# Patient Record
Sex: Male | Born: 1945
Health system: Southern US, Community
[De-identification: ages and names within clinical notes are randomized; demographics above are authoritative.]

## PROBLEM LIST (undated history)

## (undated) DIAGNOSIS — R569 Unspecified convulsions: Secondary | ICD-10-CM

## (undated) DIAGNOSIS — R351 Nocturia: Secondary | ICD-10-CM

## (undated) DIAGNOSIS — E119 Type 2 diabetes mellitus without complications: Secondary | ICD-10-CM

## (undated) DIAGNOSIS — IMO0002 Reserved for concepts with insufficient information to code with codable children: Secondary | ICD-10-CM

## (undated) DIAGNOSIS — I10 Essential (primary) hypertension: Secondary | ICD-10-CM

## (undated) DIAGNOSIS — R011 Cardiac murmur, unspecified: Secondary | ICD-10-CM

## (undated) DIAGNOSIS — R413 Other amnesia: Secondary | ICD-10-CM

## (undated) DIAGNOSIS — M329 Systemic lupus erythematosus, unspecified: Secondary | ICD-10-CM

## (undated) DIAGNOSIS — I639 Cerebral infarction, unspecified: Secondary | ICD-10-CM

## (undated) HISTORY — DX: Cardiac murmur, unspecified: R01.1

## (undated) HISTORY — DX: Type 2 diabetes mellitus without complications: E11.9

## (undated) HISTORY — DX: Reserved for concepts with insufficient information to code with codable children: IMO0002

## (undated) HISTORY — PX: EYE SURGERY: SHX253

## (undated) HISTORY — DX: Systemic lupus erythematosus, unspecified: M32.9

## (undated) HISTORY — DX: Other amnesia: R41.3

## (undated) HISTORY — DX: Nocturia: R35.1

## (undated) HISTORY — DX: Unspecified convulsions: R56.9

## (undated) HISTORY — DX: Cerebral infarction, unspecified: I63.9

## (undated) HISTORY — PX: APPENDECTOMY: SHX54

## (undated) HISTORY — DX: Essential (primary) hypertension: I10

---

## 2003-03-22 ENCOUNTER — Ambulatory Visit (HOSPITAL_COMMUNITY): Admission: RE | Admit: 2003-03-22 | Discharge: 2003-03-22 | Payer: Self-pay | Admitting: *Deleted

## 2003-03-22 ENCOUNTER — Encounter (INDEPENDENT_AMBULATORY_CARE_PROVIDER_SITE_OTHER): Payer: Self-pay | Admitting: *Deleted

## 2006-08-04 ENCOUNTER — Ambulatory Visit (HOSPITAL_COMMUNITY): Admission: RE | Admit: 2006-08-04 | Discharge: 2006-08-04 | Payer: Self-pay | Admitting: *Deleted

## 2007-06-25 ENCOUNTER — Emergency Department (HOSPITAL_COMMUNITY): Admission: EM | Admit: 2007-06-25 | Discharge: 2007-06-25 | Payer: Self-pay | Admitting: Family Medicine

## 2011-02-20 NOTE — Op Note (Signed)
   NAME:  JAHN, FRANCHINI NO.:  [id number]   MEDICAL RECORD NO.:  [id number]                   PATIENT TYPE:  AMB   LOCATION:  ENDO                                 FACILITY:  [facility]   PHYSICIAN:  Georgiana Spinner, M.D.                 DATE OF BIRTH:  [birth year]   DATE OF PROCEDURE:  DATE OF DISCHARGE:                                 OPERATIVE REPORT   PROCEDURE:  Colonoscopy.   ENDOSCOPIST:  Georgiana Spinner, M.D.   INDICATIONS:  Colon polyps.   ANESTHESIA:  Demerol 25 mg and Versed 2.5 mg.   DESCRIPTION OF PROCEDURE:  With the patient mildly sedated in the left  lateral decubitus position, the Olympus videoscopic coloscope was inserted  into the rectum and passed under direct vision into the cecum, identified by  ileocecal valve and appendiceal orifice both of which were photographed.   From this point, the colonoscope was slowly withdrawn taking circumferential  views of the entire colonic mucosa, stopping first in the ascending colon  where a small polyp was seen, photographed, and removed using hot biopsy  forceps technique setting of 2200 on the RBR nonphoto coagulator.  We next  stopped at the splenic flexure where another polyp was removed in the same  manner and we pulled it back all the way to the rectum which appeared  normal.  The rectum showed a small polyp on retroflex view that was removed,  again, using the hot biopsy forceps technique with the same setting on the  Argon photocoagulator by RBR.  The endoscope was straightened and withdrawn.  The patient's vital signs and pulse oximetry remained stable.  The patient  tolerated the procedure well without apparent complications.   FINDINGS:  Three small polyps; 1 in ascending, 2 at splenic flexure, 3 in  rectum -- all removed.   PLAN:  Await biopsy report.  Patient will call me for results and follow up  with me as an outpatient.                                               Georgiana Spinner, M.D.    GMO/MEDQ  D:  03/22/2003  T:  03/22/2003  Job:  [id number]

## 2011-02-20 NOTE — Op Note (Signed)
   NAME:  David Irwin, David Irwin NO.:  [id number]   MEDICAL RECORD NO.:  [id number]                   PATIENT TYPE:  AMB   LOCATION:  ENDO                                 FACILITY:  [facility]   PHYSICIAN:  Georgiana Spinner, M.D.                 DATE OF BIRTH:  Jul 25, 1946   DATE OF PROCEDURE:  DATE OF DISCHARGE:                                 OPERATIVE REPORT   PROCEDURE:  Upper endoscopy.   ENDOSCOPIST:  Georgiana Spinner, M.D.   INDICATIONS:  GERD.   ANESTHESIA:  Demerol 75, Versed 7.5.   DESCRIPTION OF PROCEDURE:  With the patient mildly sedated in the left  lateral decubitus position, the Olympus videoscopic endoscope was inserted  into the mouth and passed under direct vision through the esophagus which  appeared normal, into the stomach, fundus, body, antrum, duodenal bulb, and  second portion of the duodenum all appeared normal.   From this point the endoscope was slowly withdrawn taking circumferential  views of the duodenal mucosa until the endoscope then pulled back into the  stomach and placed in retroflexion to view the stomach from below.  An  incomplete wrap at the GE junction around the endoscope was noted and  photographed.  The endoscope was straightened and withdrawn taking  circumferential views of the remaining gastric and esophageal mucosa.  The  patient's vital signs and pulse oximetry remained stable.  The patient  tolerated the procedure well without apparent complications.   FINDINGS:  Incomplete wrap with gastroesophageal junction around the  endoscope indicating some degree of reflux, possibility, otherwise  unremarkable exam.   PLAN:  Proceed to colonoscopy.                                               Georgiana Spinner, M.D.    GMO/MEDQ  D:  03/22/2003  T:  03/22/2003  Job:  [id number]

## 2011-02-20 NOTE — Op Note (Signed)
NAMEJAYEL, INKS NO.:  [id number]   MEDICAL RECORD NO.:  [id number]          PATIENT TYPE:  AMB   LOCATION:  ENDO                         FACILITY:  [facility]   PHYSICIAN:  Georgiana Spinner, M.D.    DATE OF BIRTH:  11/18/1945   DATE OF PROCEDURE:  08/03/2006  DATE OF DISCHARGE:                                 OPERATIVE REPORT   PROCEDURE:  Colonoscopy.   INDICATIONS:  Colon polyps.   ANESTHESIA:  Demerol 100 mg, Versed 10 mg.   PROCEDURE:  With the patient mildly sedated in the left lateral decubitus  position, the Olympus videoscopic colonoscope was inserted into the rectum  and passed under direct vision to the cecum, identified by the crow's foot  of the cecum and ileocecal valve, both of which were photographed.  From  this point the colonoscope was slowly withdrawn taking circumferential views  of the colonic mucosa, stopping only in the rectum, which appeared normal on  direct and retroflexed view.  The endoscope was straightened and withdrawn.  The patient's vital signs and pulse oximeter remained stable.  The patient  tolerated procedure well without apparent complication.   FINDINGS:  Unremarkable examination.   PLAN:  Consider repeat examination in 5 years.           ______________________________  Georgiana Spinner, M.D.     GMO/MEDQ  D:  08/04/2006  T:  08/04/2006  Job:  [id number]

## 2013-05-01 ENCOUNTER — Encounter: Payer: Self-pay | Admitting: Nurse Practitioner

## 2013-05-01 DIAGNOSIS — R413 Other amnesia: Secondary | ICD-10-CM

## 2013-05-01 DIAGNOSIS — G3184 Mild cognitive impairment, so stated: Secondary | ICD-10-CM | POA: Insufficient documentation

## 2013-05-02 ENCOUNTER — Ambulatory Visit: Payer: Self-pay | Admitting: Diagnostic Neuroimaging

## 2013-05-02 ENCOUNTER — Ambulatory Visit (INDEPENDENT_AMBULATORY_CARE_PROVIDER_SITE_OTHER): Payer: Medicare Other | Admitting: Nurse Practitioner

## 2013-05-02 ENCOUNTER — Encounter: Payer: Self-pay | Admitting: Nurse Practitioner

## 2013-05-02 VITALS — BP 142/81 | HR 83 | Temp 97.9°F | Ht 67.0 in | Wt 232.0 lb

## 2013-05-02 DIAGNOSIS — R413 Other amnesia: Secondary | ICD-10-CM

## 2013-05-02 NOTE — Progress Notes (Signed)
[facility]  PATIENT: David Irwin DOB: Sep 16, 1946   HISTORY FROM: patient, wife, chart REASON FOR VISIT: 6 month memory follow up, results   HISTORICAL  CHIEF COMPLAINT:  Chief Complaint  Patient presents with  . Follow-up    HISTORY OF PRESENT ILLNESS: UPDATE 05/02/13 (LL): Patient and wife come to office for 6 month follow up for memory testing.  Both feel that memory has been stable, no more instances of getting lost while driving.  Patient feels like he has not gotten any worse, he has trouble remembering things that he knows sometimes, but he states it usually comes to him with time.  Just not on "his" time.  10/28/12:  PRIOR HPI (VRP): 67 year old right-handed male with history of lupus, diabetes, hypercholesterolemia, and hypertension, here for evaluation of memory loss.  Summary 2013, patient got lost while driving twice [place] [address]. He was briefly turned around but eventually was able to make his way to his destination. He had a third episode in December 2013. Since that time he has had no further events. His wife has noticed some short-term memory problems especially with verbal and auditory memory. One time he forgot his granddaughter's name (she is 31 years old and lives [place]). He has 3 grandchildren total. Patient denies any significant sleep or mood problems. Patient's wife reports some mild mood swings.  REVIEW OF SYSTEMS: Full 14 system review of systems performed and notable only for:  Constitutional: N/A  Cardiovascular: N/A  Ear/Nose/Throat: N/A  Skin: N/A  Eyes: N/A  Respiratory: N/A  Gastroitestinal: N/A  Hematology/Lymphatic: N/A  Endocrine: N/A Musculoskeletal:N/A  Allergy/Immunology: N/A  Neurological: N/A Psychiatric: N/A   ALLERGIES: No Known Allergies  HOME MEDICATIONS: Outpatient Prescriptions Prior to Visit  Medication Sig Dispense Refill  . aspirin 81 MG tablet Take 81 mg by mouth daily.      .  CYANOCOBALAMIN PO Take by mouth daily.      . fish oil-omega-3 fatty acids 1000 MG capsule 3 g daily.      . hydroxychloroquine (PLAQUENIL) 200 MG tablet Take by mouth daily.      . Liraglutide (VICTOZA) 18 MG/3ML SOPN Inject into the skin. Use as directed      . Multiple Vitamins-Minerals (ICAPS MV PO) Take by mouth.      . simvastatin (ZOCOR) 20 MG tablet Take 20 mg by mouth every evening.      . tamsulosin (FLOMAX) 0.4 MG CAPS    Sig: Take 1 capsule by mouth daily.  Marland Kitchen KLOR-CON M20 20 MEQ tablet    Sig: Take 1 tablet by mouth daily.  . valsartan-hydrochlorothiazide (DIOVAN-HCT) 160-25 MG per tablet    Sig: Take 1 tablet by mouth 2 (two) times daily.  . metFORMIN (GLUCOPHAGE-XR) 500 MG 24 hr tablet    Sig: Take 1 tablet by mouth daily.    PAST MEDICAL HISTORY: Past Medical History  Diagnosis Date  . Lupus   . Diabetes mellitus without complication     PAST SURGICAL HISTORY: Past Surgical History  Procedure Laterality Date  . Eye surgery      FAMILY HISTORY: History reviewed. No pertinent family history.  SOCIAL HISTORY: History   Social History  . Marital Status: Married    Spouse Name: [name]    Number of Children: 2  . Years of Education: 12th   Occupational History  . Retired    Social History Main Topics  . Smoking status: Never Smoker   . Smokeless tobacco: Never Used  .  Alcohol Use: No  . Drug Use: No  . Sexually Active: Not on file   Other Topics Concern  . Not on file   Social History Narrative   Patient lives at home with spouse.   Caffeine Use: 1-2 cups daily     PHYSICAL EXAM  Filed Vitals:   05/02/13 0924  BP: 142/81  Pulse: 83  Temp: 97.9 F (36.6 C)  TempSrc: Oral  Height: 5\' 7"  (1.702 m)  Weight: 232 lb (105.235 kg)   Body mass index is 36.33 kg/(m^2).  Generalized: In no acute distress, well-developed well groomed AA male.  Neck: Supple, no carotid bruits   Cardiac: Regular rate rhythm, soft systolic murmur   Pulmonary:  Clear to auscultation bilaterally   Musculoskeletal: No deformity   Neurological examination   Mentation: Alert oriented to time, place, history taking, language fluent, and casual conversation. NO FRONTAL RELEASE SIGNS.  GDS 0. SLOW, CALM DEMEANOR.  Cranial nerve II-XII: Pupils were equal round reactive to light extraocular movements were full, visual field were full on confrontational test. facial sensation and strength were normal. hearing was intact to finger rubbing bilaterally. Uvula tongue midline. head turning and shoulder shrug and were normal and symmetric.Tongue protrusion into cheek strength was normal. MOTOR: normal bulk and tone, full strength in the BUE, BLE, fine finger movements normal, no pronator drift SENSORY: normal and symmetric to light touch, pinprick, temperature, vibration COORDINATION: finger-nose-finger, heel-to-shin bilaterally, there was no truncal ataxia REFLEXES: Deep tendon reflexes are TRACE and symmetric. GAIT/STATION: Rising up from seated position without assistance, normal stance, without trunk ataxia, moderate stride, good arm swing, smooth turning, able to perform tiptoe, and heel walking without difficulty. Romberg Negative.  DIAGNOSTIC DATA (LABS, IMAGING, TESTING) - I reviewed patient records, labs, notes, testing and imaging myself where available.  B12 791, TSH 2.14   MRI BRAIN W/Wo 02/04014: Mild periventricular and subcortical foci of T2 hyperintensities.  These findings are non-specific and considerations include autoimmune, inflammatory, post-infectious, microvascular ischemia or migraine associated etiologies.  No abnormal enhancing lesions.   ASSESSMENT AND PLAN  67 y.o. year old male  has a past medical history of Lupus and Diabetes mellitus without complication. here with mild cognitive impairment, memory loss, stable in the last 6 months.  Discussed donepizil with patient, he does not want to take another medication at this  time. Return to office for follow up in 1 year, sooner as needed.  Recommended daily exercise, try to relax more, take a trip with his wife, learn a new hobby or skill, these things may improve memory function.Marland Kitchen  LYNN LAM NP-C 05/02/2013, 10:18 AM  [facility] [address], [address] [place], [place] [address] [phone]

## 2013-05-02 NOTE — Patient Instructions (Addendum)
Return to office for follow up in 1 year, sooner as needed.  Try to relax, take a trip together, learn a new hobby, these things may improve memory.

## 2013-05-03 NOTE — Progress Notes (Signed)
I reviewed note and agree with plan.   VIKRAM R. PENUMALLI, MD 05/03/2013, 6:31 PM Certified in Neurology, Neurophysiology and Neuroimaging  [facility] [address], [address] , [place] [address] [phone]  

## 2014-05-02 ENCOUNTER — Ambulatory Visit: Payer: Medicare Other | Admitting: Nurse Practitioner

## 2014-05-04 ENCOUNTER — Encounter: Payer: Self-pay | Admitting: Nurse Practitioner

## 2014-05-04 ENCOUNTER — Ambulatory Visit (INDEPENDENT_AMBULATORY_CARE_PROVIDER_SITE_OTHER): Payer: Medicare HMO | Admitting: Nurse Practitioner

## 2014-05-04 VITALS — BP 113/71 | HR 79 | Ht 67.0 in | Wt 236.0 lb

## 2014-05-04 DIAGNOSIS — R413 Other amnesia: Secondary | ICD-10-CM

## 2014-05-04 DIAGNOSIS — E119 Type 2 diabetes mellitus without complications: Secondary | ICD-10-CM

## 2014-05-04 DIAGNOSIS — G3184 Mild cognitive impairment, so stated: Secondary | ICD-10-CM

## 2014-05-04 DIAGNOSIS — Z9989 Dependence on other enabling machines and devices: Secondary | ICD-10-CM

## 2014-05-04 DIAGNOSIS — G4733 Obstructive sleep apnea (adult) (pediatric): Secondary | ICD-10-CM

## 2014-05-04 NOTE — Patient Instructions (Addendum)
I think overall you are doing fairly well but I do want to suggest a few things today:  Remember to drink plenty of fluid, eat healthy meals and do not skip any meals. Try to eat protein with a every meal and eat a healthy snack such as fruit or nuts in between meals. Try to keep a regular sleep-wake schedule and try to exercise daily, particularly in the form of walking, 20-30 minutes a day, if you can. Good nutrition, proper sleep and exercise can help her cognitive function.  Engage in social activities in your community and with your family and try to keep up with current events by reading the newspaper or watching the news. If you have computer and can go online, try [email or web address]. Also, you may like to do word finding puzzles or crossword puzzles.  As far as your medications are concerned, I would like to suggest Taking Fish Oil capsules daily, Using your CPAP regularly, and get regular exercise.  Keep your blood sugars under control.    As far as diagnostic testing: nothing needed at this time.  I would like to see you back in 6 months, sooner if we need to. Please call us with any interim questions, concerns, problems, updates or refill requests.  Please also call us for any test results so we can go over those with you on the phone. My clinical assistant and will answer any of your questions and relay your messages to me and also relay most of my messages to you.  Our phone number is [phone]. We also have an after hours call service for urgent matters and there is a physician on-call for urgent questions. For any emergencies you know to call 911 or go to the nearest emergency room.

## 2014-05-04 NOTE — Progress Notes (Signed)
PATIENT: David BoozeGeorge Cowdery DOB: 11/16/1945  REASON FOR VISIT: routine follow up for memory HISTORY FROM: patient, wife  HISTORY OF PRESENT ILLNESS: UPDATE 05/04/14 (LL): Since last visit, patient has been well, but recognizes he has more difficulty remembering places he had been in the last year or two. He does not remember coming to this office one year ago. He has had no decline in activities of daily living. His blood sugars are well controlled, hemoglobin A1c is 6.0. He does not get regular exercise.  He has sleep apnea and has a CPAP machine and tries to use it but states that the full mask is uncomfortable. MMSE 24/30, AFT 12 and CDT 4/4.  Functional Assessment:  Activities of Daily Living (ADLs):   He is independent in the following: ambulation, bathing and hygiene, feeding, continence, grooming, toileting and dressing Requires assistance with the following: none Instrumental Activities of Daily Living (IADLs):   Patient is independent in the following: Ability to use telephone, shopping, preparation, housekeeping, laundry, driving, handling medications, handling finances.  Requires assistance with the following: none  UPDATE 05/02/13 (LL): Patient and wife come to office for 6 month follow up for memory testing. Both feel that memory has been stable, no more instances of getting lost while driving. Patient feels like he has not gotten any worse, he has trouble remembering things that he knows sometimes, but he states it usually comes to him with time. Just not on "his" time.   10/28/12: PRIOR HPI (VRP): 68 year old right-handed male with history of lupus, diabetes, hypercholesterolemia, and hypertension, here for evaluation of memory loss.   Summary 2013, patient got lost while driving twice [place] [address]. He was briefly turned around but eventually was able to make his way to his destination. He had a third episode in December 2013. Since that time he has had no further events.  His wife has noticed some short-term memory problems especially with verbal and auditory memory. One time he forgot his granddaughter's name (she is 66 years old and lives [place]). He has 3 grandchildren total. Patient denies any significant sleep or mood problems. Patient's wife reports some mild mood swings.   REVIEW OF SYSTEMS: Full 14 system review of systems performed and notable only for: snoring, moles, itching, memory loss, confusion, tremors  ALLERGIES: No Known Allergies  HOME MEDICATIONS: Outpatient Prescriptions Prior to Visit  Medication Sig Dispense Refill  . aspirin 81 MG tablet Take 81 mg by mouth daily.      . CYANOCOBALAMIN PO Take by mouth daily.      . fish oil-omega-3 fatty acids 1000 MG capsule 3 g daily.      . hydroxychloroquine (PLAQUENIL) 200 MG tablet Take by mouth daily.      Marland Kitchen. KLOR-CON M20 20 MEQ tablet Take 1 tablet by mouth daily.      . Liraglutide (VICTOZA) 18 MG/3ML SOPN Inject into the skin. Use as directed      . metFORMIN (GLUCOPHAGE-XR) 500 MG 24 hr tablet Take 1 tablet by mouth daily.      . Multiple Vitamins-Minerals (ICAPS MV PO) Take by mouth.      . tamsulosin (FLOMAX) 0.4 MG CAPS Take 1 capsule by mouth daily.      . valsartan-hydrochlorothiazide (DIOVAN-HCT) 160-25 MG per tablet Take 1 tablet by mouth 2 (two) times daily.      . simvastatin (ZOCOR) 20 MG tablet Take 20 mg by mouth every evening.       No facility-administered medications  prior to visit.    PHYSICAL EXAM Filed Vitals:   05/04/14 0943  BP: 113/71  Pulse: 79  Height: 5\' 7"  (1.702 m)  Weight: 236 lb (107.049 kg)   Body mass index is 36.95 kg/(m^2).  MMSE - Mini Mental State Exam 05/04/2014  Orientation to time 5  Orientation to Place 5  Registration 3  Attention/ Calculation 2  Recall 2  Language- name 2 objects 2  Language- repeat 1  Language- follow 3 step command 2  Language- read & follow direction 1  Write a sentence 1  Copy design 0  Total score 24     Generalized: In no acute distress, moderately obese, well groomed AA male.  Neck: Supple, no carotid bruits  Cardiac: Regular rate rhythm, soft systolic murmur  Pulmonary: Clear to auscultation bilaterally  Musculoskeletal: No deformity   Neurological examination  Mentation: Alert oriented to time, place, history taking, language fluent, and casual conversation. NO FRONTAL RELEASE SIGNS. GDS 0. SLOW, CALM DEMEANOR.  Cranial nerve II-XII: Pupils were equal round reactive to light extraocular movements were full, visual field were full on confrontational test. facial sensation and strength were normal. hearing was intact to finger rubbing bilaterally. Uvula tongue midline. head turning and shoulder shrug and were normal and symmetric.Tongue protrusion into cheek strength was normal.  MOTOR: normal bulk and tone, full strength in the BUE, BLE, fine finger movements normal, no pronator drift  SENSORY: normal and symmetric to light touch, pinprick, temperature, vibration  COORDINATION: finger-nose-finger, heel-to-shin bilaterally, there was no truncal ataxia  REFLEXES: Deep tendon reflexes are TRACE and symmetric.  GAIT/STATION: Rising up from seated position without assistance, normal stance, without trunk ataxia, moderate stride, good arm swing, smooth turning, able to perform tiptoe, and heel walking without difficulty. Romberg Negative.   DIAGNOSTIC DATA (LABS, IMAGING, TESTING) - I reviewed patient records, labs, notes, testing and imaging myself where available.  B12 791, TSH 2.14  MRI BRAIN W/Wo 02/04014:  Mild periventricular and subcortical foci of T2 hyperintensities. These findings are non-specific and considerations include autoimmune, inflammatory, post-infectious, microvascular ischemia or migraine associated etiologies. No abnormal enhancing lesions.   ASSESSMENT: 68 y.o. AA male with a past medical history of Lupus and Diabetes mellitus without complication here with mild  cognitive impairment with memory loss, stable in the last 6 months.   PLAN: Discussed donepizil with patient, he does not want to take another medication at this time.  I have asked them to speak to the DME provider about changing to a smaller CPAP mask that is more comfortable for him, as obstructive sleep apnea may be a factor in memory loss. Recommended daily exercise, relaxation exercises, learn a new hobby or skill, engage in social activities in the community and with your family; these things may improve memory function. Return in about 1 year (around 05/05/2015) for memory loss, repeat MMSE.  Tawny Asal Jisel Fleet, MSN, FNP-BC, A/GNP-C 05/05/2014, 11:53 AM [facility] [address], [address] [place], [place] [address] [phone]  Note: This document was prepared with digital dictation and possible smart phrase technology. Any transcriptional errors that result from this process are unintentional.

## 2014-05-05 ENCOUNTER — Encounter: Payer: Self-pay | Admitting: Nurse Practitioner

## 2014-05-07 NOTE — Progress Notes (Signed)
I reviewed note and agree with plan.   Aveen Stansel R. Sary Bogie, MD 05/07/2014, 1:09 PM Certified in Neurology, Neurophysiology and Neuroimaging  [facility] [address], [address] [place],  [address] [phone]  

## 2014-07-31 ENCOUNTER — Telehealth: Payer: Self-pay | Admitting: Diagnostic Neuroimaging

## 2014-07-31 NOTE — Telephone Encounter (Signed)
Called and spoke to patient's wife Dr.Penumalli had a opening put patient in slot.

## 2014-07-31 NOTE — Telephone Encounter (Signed)
Dr. Zollie BeckersWalter Kohut's office calling to request sooner appointment for patient due to having 2 episodes of being slightly unresponsive, shaking, slurred speech, confusion, and memory problems, please call the wife, [name] to schedule, she can be reached at [phone].

## 2014-08-01 ENCOUNTER — Encounter: Payer: Self-pay | Admitting: Diagnostic Neuroimaging

## 2014-08-01 ENCOUNTER — Ambulatory Visit (INDEPENDENT_AMBULATORY_CARE_PROVIDER_SITE_OTHER): Payer: Medicare HMO | Admitting: Diagnostic Neuroimaging

## 2014-08-01 VITALS — BP 125/76 | HR 76 | Ht 70.0 in | Wt 231.4 lb

## 2014-08-01 DIAGNOSIS — R404 Transient alteration of awareness: Secondary | ICD-10-CM

## 2014-08-01 DIAGNOSIS — IMO0001 Reserved for inherently not codable concepts without codable children: Secondary | ICD-10-CM

## 2014-08-01 DIAGNOSIS — G459 Transient cerebral ischemic attack, unspecified: Secondary | ICD-10-CM

## 2014-08-01 DIAGNOSIS — R569 Unspecified convulsions: Secondary | ICD-10-CM

## 2014-08-01 NOTE — Progress Notes (Signed)
PATIENT: David Irwin DOB: 08/02/1946  REASON FOR VISIT: routine follow up for memory HISTORY FROM: patient, wife  HISTORY OF PRESENT ILLNESS:  UPDATE 08/01/14 (VRP): Since last visit, was stable, except has had intermittent spells according to wife: staring, inability to speak, jittery movements of head and hands, lasting 1 minute; no tongue biting or incontinence. No post-ictal confusion. No warning. Has had spells 1 every 3-4 months x 1 year, and then 2 spells in last 2 weeks. No triggers  UPDATE 05/04/14 (LL): Since last visit, patient has been well, but recognizes he has more difficulty remembering places he had been in the last year or two. He does not remember coming to this office one year ago. He has had no decline in activities of daily living. His blood sugars are well controlled, hemoglobin A1c is 6.0. He does not get regular exercise.  He has sleep apnea and has a CPAP machine and tries to use it but states that the full mask is uncomfortable. MMSE 24/30, AFT 12 and CDT 4/4.  UPDATE 05/02/13 (LL): Patient and wife come to office for 6 month follow up for memory testing. Both feel that memory has been stable, no more instances of getting lost while driving. Patient feels like he has not gotten any worse, he has trouble remembering things that he knows sometimes, but he states it usually comes to him with time. Just not on "his" time.   10/28/12: PRIOR HPI (VRP): 68 year old right-handed male with history of lupus, diabetes, hypercholesterolemia, and hypertension, here for evaluation of memory loss. Summary 2013, patient got lost while driving twice on [address]. He was briefly turned around but eventually was able to make his way to his destination. He had a third episode in December 2013. Since that time he has had no further events. His wife has noticed some short-term memory problems especially with verbal and auditory memory. One time he forgot his granddaughter's name  ([place] 66 [place] He has 3 grandchildren total. Patient denies any significant sleep or mood problems. Patient's wife reports some mild mood swings.   REVIEW OF SYSTEMS: Full 14 system review of systems performed and notable only for: memory loss tremors agitation confusion nervous anxious apnea snoring neck pain back pain.  ALLERGIES: No Known Allergies  HOME MEDICATIONS: Outpatient Prescriptions Prior to Visit  Medication Sig Dispense Refill  . aspirin 81 MG tablet Take 81 mg by mouth daily.      . CYANOCOBALAMIN PO Take by mouth daily.      . fish oil-omega-3 fatty acids 1000 MG capsule 3 g daily.      Marland Kitchen. KLOR-CON M20 20 MEQ tablet Take 1 tablet by mouth daily.      . Liraglutide (VICTOZA) 18 MG/3ML SOPN Inject into the skin. Use as directed      . metFORMIN (GLUCOPHAGE-XR) 500 MG 24 hr tablet Take 1 tablet by mouth daily.      . Multiple Vitamins-Minerals (ICAPS MV PO) Take by mouth.      . simvastatin (ZOCOR) 20 MG tablet Take 20 mg by mouth every evening.      . tamsulosin (FLOMAX) 0.4 MG CAPS Take 1 capsule by mouth daily.      Marland Kitchen. testosterone cypionate (DEPOTESTOTERONE CYPIONATE) 200 MG/ML injection Inject 100 mg into the muscle every 14 (fourteen) days.      . valsartan-hydrochlorothiazide (DIOVAN-HCT) 160-25 MG per tablet Take 1 tablet by mouth 2 (two) times daily.      .Marland Kitchen  hydroxychloroquine (PLAQUENIL) 200 MG tablet Take by mouth daily.       No facility-administered medications prior to visit.    PHYSICAL EXAM Filed Vitals:   08/01/14 1335  BP: 125/76  Pulse: 76  Height: 5\' 10"  (1.778 m)  Weight: 231 lb 6.4 oz (104.962 kg)    Body mass index is 33.2 kg/(m^2).  MMSE - Mini Mental State Exam 08/01/2014 05/04/2014  Orientation to time 4 5  Orientation to Place 5 5  Registration 3 3  Attention/ Calculation 4 2  Recall 2 2  Language- name 2 objects 2 2  Language- repeat 1 1  Language- follow 3 step command 2 2  Language- read & follow  direction 1 1  Write a sentence 1 1  Copy design 0 0  Total score 25 24    GENERAL EXAM: Patient is in no distress; well developed, nourished and groomed; neck is supple  CARDIOVASCULAR: Regular rate and rhythm, no murmurs, no carotid bruits  NEUROLOGIC: MENTAL STATUS: awake, alert, comprehension intact, naming intact, fund of knowledge appropriate; SLOW RESPONSES, NO FRONTAL RELEASE SIGNS; MASKED FACIES CRANIAL NERVE: PINPOINT PUPILS, pupils equal and reactive to light, visual fields full to confrontation, extraocular muscles intact, no nystagmus, facial sensation symmetric, DECR RIGHT NL FOLD, hearing intact, palate elevates symmetrically, uvula midline, shoulder shrug symmetric, tongue midline. MOTOR: normal bulk and tone, full strength in the BUE, BLE; MILD BRADYKINESIA IN BUE SENSORY: normal and symmetric to light touch  COORDINATION: finger-nose-finger, fine finger movements normal REFLEXES: deep tendon reflexes present and symmetric GAIT/STATION: narrow based gait; DECR ARM SWING, romberg is negative   DIAGNOSTIC DATA (LABS, IMAGING, TESTING)  I reviewed images myself and agree with interpretation. -VRP  B12 791, TSH 2.14   11/08/12 MRI BRAIN - Mild periventricular and subcortical foci of T2 hyperintensities. These findings are non-specific and considerations include autoimmune, inflammatory, post-infectious, microvascular ischemia or migraine associated etiologies. No abnormal enhancing lesions.    ASSESSMENT: 68 y.o. male with a past medical history of Lupus and Diabetes mellitus without complication here with mild cognitive impairment with memory loss; now with intermittent episodes of confusion, staring, jitteriness.   Ddx: mild dementia + (seizures vs TIA)  PLAN: - further workup for seizure vs TIA - no driving (due to memory and abnl spells)  Orders Placed This Encounter  Procedures  . MR Brain Wo Contrast  . MR MRA HEAD WO CONTRAST  . US Carotid Bilateral  .  2D Echocardiogram without contrast  . EEG adult   Return in about 1 month (around 09/01/2014).  I reviewed images, labs, notes, records myself. I summarized findings and reviewed with patient, for this high risk condition (seizure vs TIA) requiring high complexity decision making.   Suanne MarkerVIKRAM R. Horace Lukas, MD 08/01/2014, 2:46 PM Certified in Neurology, Neurophysiology and Neuroimaging  [facility] [address], [address] [place], [place] [address] [phone]

## 2014-08-01 NOTE — Patient Instructions (Signed)
No driving until further notice.  I will check MRI, EEG and ultrasound testing.

## 2014-08-06 ENCOUNTER — Ambulatory Visit (HOSPITAL_COMMUNITY): Payer: Medicare HMO | Attending: Diagnostic Neuroimaging | Admitting: Radiology

## 2014-08-06 DIAGNOSIS — E669 Obesity, unspecified: Secondary | ICD-10-CM | POA: Diagnosis not present

## 2014-08-06 DIAGNOSIS — G459 Transient cerebral ischemic attack, unspecified: Secondary | ICD-10-CM

## 2014-08-06 DIAGNOSIS — R569 Unspecified convulsions: Secondary | ICD-10-CM

## 2014-08-06 DIAGNOSIS — I10 Essential (primary) hypertension: Secondary | ICD-10-CM | POA: Diagnosis not present

## 2014-08-06 DIAGNOSIS — E119 Type 2 diabetes mellitus without complications: Secondary | ICD-10-CM | POA: Diagnosis not present

## 2014-08-06 DIAGNOSIS — IMO0001 Reserved for inherently not codable concepts without codable children: Secondary | ICD-10-CM

## 2014-08-06 DIAGNOSIS — E785 Hyperlipidemia, unspecified: Secondary | ICD-10-CM | POA: Insufficient documentation

## 2014-08-06 NOTE — Progress Notes (Signed)
Echocardiogram performed.  

## 2014-08-07 ENCOUNTER — Ambulatory Visit (INDEPENDENT_AMBULATORY_CARE_PROVIDER_SITE_OTHER): Payer: Medicare HMO | Admitting: Radiology

## 2014-08-07 DIAGNOSIS — G459 Transient cerebral ischemic attack, unspecified: Secondary | ICD-10-CM

## 2014-08-07 DIAGNOSIS — R569 Unspecified convulsions: Secondary | ICD-10-CM

## 2014-08-07 DIAGNOSIS — R404 Transient alteration of awareness: Secondary | ICD-10-CM

## 2014-08-07 DIAGNOSIS — IMO0001 Reserved for inherently not codable concepts without codable children: Secondary | ICD-10-CM

## 2014-08-08 ENCOUNTER — Ambulatory Visit: Payer: Medicare HMO | Admitting: Diagnostic Neuroimaging

## 2014-08-09 ENCOUNTER — Other Ambulatory Visit: Payer: Medicare HMO

## 2014-08-13 NOTE — Procedures (Signed)
   [facility]  EEG (ELECTROENCEPHALOGRAM) REPORT   STUDY DATE: 08/07/14 PATIENT NAME: David Irwin DOB: 05/18/1946 MRN: [id number]  ORDERING CLINICIAN: Joycelyn SchmidVikram Cherae Marton, MD   TECHNOLOGIST: Kaylyn LimSue Fox  TECHNIQUE: Electroencephalogram was recorded utilizing standard 10-20 system of lead placement and reformatted into average and bipolar montages.  RECORDING TIME: 31 minutes ACTIVATION: hyperventilation and photic stimulation  CLINICAL INFORMATION: Kaylyn LimSue Fox   FINDINGS: Background rhythms of 9-10 hertz and 72 microvolts. No focal, lateralizing, epileptiform activity or seizures are seen. Patient recorded in the awake state. EKG channel shows 72 beats per minute.  IMPRESSION:  Normal EEG in the awake state.    INTERPRETING PHYSICIAN:  Suanne MarkerVIKRAM R. Brittania Sudbeck, MD Certified in Neurology, Neurophysiology and Neuroimaging  [facility] [address], [address] [place], [place] [address] [phone]

## 2014-08-15 ENCOUNTER — Ambulatory Visit (INDEPENDENT_AMBULATORY_CARE_PROVIDER_SITE_OTHER): Payer: Medicare HMO

## 2014-08-15 DIAGNOSIS — G459 Transient cerebral ischemic attack, unspecified: Secondary | ICD-10-CM

## 2014-08-15 DIAGNOSIS — R569 Unspecified convulsions: Secondary | ICD-10-CM

## 2014-08-15 DIAGNOSIS — IMO0001 Reserved for inherently not codable concepts without codable children: Secondary | ICD-10-CM

## 2014-08-18 ENCOUNTER — Ambulatory Visit
Admission: RE | Admit: 2014-08-18 | Discharge: 2014-08-18 | Disposition: A | Discharge status: Home / Self Care | Payer: Medicare HMO | Source: Ambulatory Visit | Attending: Diagnostic Neuroimaging | Admitting: Diagnostic Neuroimaging

## 2014-08-18 DIAGNOSIS — IMO0001 Reserved for inherently not codable concepts without codable children: Secondary | ICD-10-CM

## 2014-08-18 DIAGNOSIS — R404 Transient alteration of awareness: Secondary | ICD-10-CM

## 2014-08-18 DIAGNOSIS — G459 Transient cerebral ischemic attack, unspecified: Secondary | ICD-10-CM

## 2014-08-18 DIAGNOSIS — R569 Unspecified convulsions: Secondary | ICD-10-CM

## 2014-08-27 ENCOUNTER — Telehealth: Payer: Self-pay | Admitting: Neurology

## 2014-08-27 NOTE — Telephone Encounter (Signed)
I called the patient. A carotid Doppler studies appeared to be unremarkable.

## 2014-08-28 ENCOUNTER — Telehealth: Payer: Self-pay | Admitting: Neurology

## 2014-08-28 NOTE — Telephone Encounter (Signed)
I called patient, talk with the wife. They were requesting results of the MRI studies. MRI shows a very small acute or subacute left cerebellar infarct, mild small vessel disease is present over above this. 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 The EEG study was normal. The 2-D echocardiogram was unremarkable, with an ejection fraction of 60-65%. The patient is on low-dose aspirin. They will follow-up with [name] on 09/10/2014.

## 2014-08-28 NOTE — Telephone Encounter (Signed)
Pt's wife is calling back regarding the message about test results.  She has some questions regarding results.  Please call and advise.  She will be home most of the morning. Did advise to give 24 to 48 hours for a call back.

## 2014-09-10 ENCOUNTER — Ambulatory Visit (INDEPENDENT_AMBULATORY_CARE_PROVIDER_SITE_OTHER): Payer: Medicare HMO | Admitting: Diagnostic Neuroimaging

## 2014-09-10 ENCOUNTER — Encounter: Payer: Self-pay | Admitting: Diagnostic Neuroimaging

## 2014-09-10 VITALS — BP 112/68 | HR 84 | Ht 70.0 in | Wt 237.0 lb

## 2014-09-10 DIAGNOSIS — R413 Other amnesia: Secondary | ICD-10-CM

## 2014-09-10 DIAGNOSIS — G459 Transient cerebral ischemic attack, unspecified: Secondary | ICD-10-CM

## 2014-09-10 DIAGNOSIS — IMO0001 Reserved for inherently not codable concepts without codable children: Secondary | ICD-10-CM

## 2014-09-10 DIAGNOSIS — R404 Transient alteration of awareness: Secondary | ICD-10-CM

## 2014-09-10 NOTE — Patient Instructions (Signed)
Monitor spells.  Check blood sugar with future events if any.  Eat well, regular small meals.

## 2014-09-10 NOTE — Progress Notes (Signed)
PATIENT: David BoozeGeorge Irwin DOB: 09/12/1946  REASON FOR VISIT: routine follow up for memory HISTORY FROM: patient, wife  HISTORY OF PRESENT ILLNESS:  UPDATE 09/10/14: Since last visit, was doing well, except 1 more brief staring spell event few nights ago. Patient was able to respond verbally when wife called to him. Spell lasted < 1min. No shaking.   UPDATE 08/01/14 (VRP): Since last visit, was stable, except has had intermittent spells according to wife: staring, inability to speak, jittery movements of head and hands, lasting 1 minute; no tongue biting or incontinence. No post-ictal confusion. No warning. Has had spells 1 every 3-4 months x 1 year, and then 2 spells in last 2 weeks. No triggers  UPDATE 05/04/14 (LL): Since last visit, patient has been well, but recognizes he has more difficulty remembering places he had been in the last year or two. He does not remember coming to this office one year ago. He has had no decline in activities of daily living. His blood sugars are well controlled, hemoglobin A1c is 6.0. He does not get regular exercise.  He has sleep apnea and has a CPAP machine and tries to use it but states that the full mask is uncomfortable. MMSE 24/30, AFT 12 and CDT 4/4.  UPDATE 05/02/13 (LL): Patient and wife come to office for 6 month follow up for memory testing. Both feel that memory has been stable, no more instances of getting lost while driving. Patient feels like he has not gotten any worse, he has trouble remembering things that he knows sometimes, but he states it usually comes to him with time. Just not on "his" time.   10/28/12: PRIOR HPI (VRP): 68 year old right-handed male with history of lupus, diabetes, hypercholesterolemia, and hypertension, here for evaluation of memory loss. Summary 2013, patient got lost while driving twice [place] [address]. He was briefly turned around but eventually was able to make his way to his destination. He had a third episode in  December 2013. Since that time he has had no further events. His wife has noticed some short-term memory problems especially with verbal and auditory memory. One time he forgot his granddaughter's name (she is 66 years old and [place]). He has 3 grandchildren total. Patient denies any significant sleep or mood problems. Patient's wife reports some mild mood swings.   REVIEW OF SYSTEMS: Full 14 system review of systems performed and notable only for: memory loss headache tremors neck pain back pain joint pain/swelling.    ALLERGIES: No Known Allergies  HOME MEDICATIONS: Outpatient Prescriptions Prior to Visit  Medication Sig Dispense Refill  . aspirin 81 MG tablet Take 81 mg by mouth daily.    . CYANOCOBALAMIN PO Take by mouth daily.    . fish oil-omega-3 fatty acids 1000 MG capsule 3 g daily.    . hydroxychloroquine (PLAQUENIL) 200 MG tablet Take by mouth daily.    Marland Kitchen. KLOR-CON M20 20 MEQ tablet Take 1 tablet by mouth daily.    . Liraglutide (VICTOZA) 18 MG/3ML SOPN Inject into the skin. Use as directed    . metFORMIN (GLUCOPHAGE-XR) 500 MG 24 hr tablet Take 1 tablet by mouth daily.    . Multiple Vitamins-Minerals (ICAPS MV PO) Take by mouth.    . simvastatin (ZOCOR) 20 MG tablet Take 20 mg by mouth every evening.    . tamsulosin (FLOMAX) 0.4 MG CAPS Take 1 capsule by mouth daily.    Marland Kitchen. testosterone cypionate (DEPOTESTOTERONE CYPIONATE) 200 MG/ML injection Inject 100 mg  into the muscle every 14 (fourteen) days.    . valsartan-hydrochlorothiazide (DIOVAN-HCT) 160-25 MG per tablet Take 1 tablet by mouth 2 (two) times daily.     No facility-administered medications prior to visit.    PHYSICAL EXAM Filed Vitals:   09/10/14 1008  BP: 112/68  Pulse: 84  Height: 5\' 10"  (1.778 m)  Weight: 237 lb (107.502 kg)    Body mass index is 34.01 kg/(m^2).  MMSE - Mini Mental State Exam 08/01/2014 05/04/2014  Orientation to time 4 5  Orientation to Place 5 5  Registration 3 3  Attention/  Calculation 4 2  Recall 2 2  Language- name 2 objects 2 2  Language- repeat 1 1  Language- follow 3 step command 2 2  Language- read & follow direction 1 1  Write a sentence 1 1  Copy design 0 0  Total score 25 24    GENERAL EXAM: Patient is in no distress; well developed, nourished and groomed; neck is supple  CARDIOVASCULAR: Regular rate and rhythm, no murmurs, no carotid bruits  NEUROLOGIC: MENTAL STATUS: awake, alert, comprehension intact, naming intact, fund of knowledge appropriate; SLOW RESPONSES, NO FRONTAL RELEASE SIGNS; MASKED FACIES CRANIAL NERVE: PINPOINT PUPILS, pupils equal and reactive to light, visual fields full to confrontation, extraocular muscles intact, no nystagmus, facial sensation symmetric, DECR RIGHT NL FOLD, hearing intact, palate elevates symmetrically, uvula midline, shoulder shrug symmetric, tongue midline. MOTOR: normal bulk and tone, full strength in the BUE, BLE; MILD BRADYKINESIA IN BUE SENSORY: normal and symmetric to light touch  COORDINATION: finger-nose-finger, fine finger movements normal REFLEXES: deep tendon reflexes present and symmetric GAIT/STATION: narrow based gait; DECR ARM SWING, romberg is negative   DIAGNOSTIC DATA (LABS, IMAGING, TESTING)  I reviewed images myself and agree with interpretation. -VRP  B12 791, TSH 2.14   11/08/12 MRI BRAIN - Mild periventricular and subcortical foci of T2 hyperintensities. These findings are non-specific and considerations include autoimmune, inflammatory, post-infectious, microvascular ischemia or migraine associated etiologies. No abnormal enhancing lesions.   08/18/14 MRI brain 1. Small acute-subacute left cerebellar ischemic infarction (4mm) within the postero-lateral left cerebellar hemisphere. 2. Several bilateral cerebellar chronic lacunar infarctions.  3. Mild periventricular, subcortical and juxtacortical chronic small vessel ischemic disease.   08/18/14 MRA head (without)  demonstrating: 1. The right vertebral artery is hypoplastic near the skull base and then appears to be occluded for a short segment, and then has apparent reconstitution 1.7cm proximal to the vertebrobasilar junction. 2. Left vertebral artery is dominant, with normal flow throughout the basilar artery. 3. The anterior circulation is unremarkable.  08/15/14 carotid u/s - normal  08/06/14 TTE  - Left ventricle: The cavity size was normal. Wall thickness was normal. Systolic function was normal. The estimated ejection fraction was in the range of 60% to 65%. Wall motion was normal;there were no regional wall motion abnormalities. Doppler parameters are consistent with abnormal left ventricularrelaxation (grade 1 diastolic dysfunction). - Aortic valve: There was mild regurgitation. - Pulmonary arteries: Systolic pressure was mildly increased. PA peak pressure: 40 mm Hg (S).    ASSESSMENT: 68 y.o. male with a past medical history of Lupus and Diabetes mellitus without complication here with mild cognitive impairment with memory loss; now with intermittent episodes of confusion, staring, jitteriness, but unclear etiology (TIA vs seizure vs daydreaming/dementia behavior).   Ddx: mild dementia + (seizures vs TIA)  PLAN: - continue aspirin, statin, diabetes control - if more events continue, may consider changing aspirin to plavix, and may consider longer  EEG vs epirin anti-seizure medication - no driving (due to memory and abnl spells)  Return in about 3 months (around 12/10/2014).    Suanne Marker, MD 09/10/2014, 11:06 AM Certified in Neurology, Neurophysiology and Neuroimaging  [facility] [address], [address] [place], [place] [address] [phone]

## 2014-12-10 ENCOUNTER — Ambulatory Visit (INDEPENDENT_AMBULATORY_CARE_PROVIDER_SITE_OTHER): Payer: PPO | Admitting: Diagnostic Neuroimaging

## 2014-12-10 ENCOUNTER — Encounter: Payer: Self-pay | Admitting: Diagnostic Neuroimaging

## 2014-12-10 VITALS — BP 140/78 | HR 70 | Ht 71.0 in | Wt 246.0 lb

## 2014-12-10 DIAGNOSIS — R404 Transient alteration of awareness: Secondary | ICD-10-CM | POA: Diagnosis not present

## 2014-12-10 DIAGNOSIS — R413 Other amnesia: Secondary | ICD-10-CM | POA: Diagnosis not present

## 2014-12-10 DIAGNOSIS — IMO0001 Reserved for inherently not codable concepts without codable children: Secondary | ICD-10-CM

## 2014-12-10 NOTE — Patient Instructions (Signed)
Follow up in 3 months. We will re-check memory test (MMSE) at that time.

## 2014-12-10 NOTE — Progress Notes (Signed)
PATIENT: David Irwin DOB: 03/04/1946  REASON FOR VISIT: routine follow up for memory HISTORY FROM: patient, wife  Chief Complaint  Patient presents with  . Follow-up    "im here just for a check-up"     HISTORY OF PRESENT ILLNESS:  UPDATE 12/10/14: Since last visit, doing about the same. 2 weeks ago patient had episode of staring spell 2, lasting less than a minute, when patient's wife was talking to him. Wife noted that he seemed to be acting differently. He was closing his eyes and shaking his head. She asked him what was wrong and he did not respond. She insisted on him trying to explain what was going on. He moved to stretch his muscles, rolled his neck, and finally was able to say some words. However his verbal communication was not related to what his wife was asking about. He seemed to be talking about tangential topics. This lasted just for less than a minute or so. The day before patient had been trying a new pain medication, but he does not recall the name of it. Overall general memory loss has slightly progressed.  UPDATE 09/10/14: Since last visit, was doing well, except 1 more brief staring spell event few nights ago. Patient was able to respond verbally when wife called to him. Spell lasted < 1min. No shaking.   UPDATE 08/01/14 (VRP): Since last visit, was stable, except has had intermittent spells according to wife: staring, inability to speak, jittery movements of head and hands, lasting 1 minute; no tongue biting or incontinence. No post-ictal confusion. No warning. Has had spells 1 every 3-4 months x 1 year, and then 2 spells in last 2 weeks. No triggers  UPDATE 05/04/14 (LL): Since last visit, patient has been well, but recognizes he has more difficulty remembering places he had been in the last year or two. He does not remember coming to this office one year ago. He has had no decline in activities of daily living. His blood sugars are well controlled, hemoglobin A1c is  6.0. He does not get regular exercise.  He has sleep apnea and has a CPAP machine and tries to use it but states that the full mask is uncomfortable. MMSE 24/30, AFT 12 and CDT 4/4.  UPDATE 05/02/13 (LL): Patient and wife come to office for 6 month follow up for memory testing. Both feel that memory has been stable, no more instances of getting lost while driving. Patient feels like he has not gotten any worse, he has trouble remembering things that he knows sometimes, but he states it usually comes to him with time. Just not on "his" time.   PRIOR HPI (10/28/12, VRP): 69 year old right-handed male with history of lupus, diabetes, hypercholesterolemia, and hypertension, here for evaluation of memory loss. Summary 2013, patient got lost while driving twice on [address]. He was briefly turned around but eventually was able to make his way to his destination. He had a third episode in December 2013. Since that time he has had no further events. His wife has noticed some short-term memory problems especially with verbal and auditory memory. One time he forgot his granddaughter's name (she is 67 years old and [place] He has 3 grandchildren total. Patient denies any significant sleep or mood problems. Patient's wife reports some mild mood swings.    REVIEW OF SYSTEMS: Full 14 system review of systems performed and notable only for: memory loss neck pain back pain joint pain/swelling.    ALLERGIES: No  Known Allergies  HOME MEDICATIONS: Outpatient Prescriptions Prior to Visit  Medication Sig Dispense Refill  . aspirin 81 MG tablet Take 81 mg by mouth daily.    . CYANOCOBALAMIN PO Take by mouth daily.    . fish oil-omega-3 fatty acids 1000 MG capsule 3 g daily.    . hydroxychloroquine (PLAQUENIL) 200 MG tablet Take by mouth daily.    Marland Kitchen KLOR-CON M20 20 MEQ tablet Take 1 tablet by mouth daily.    . Liraglutide (VICTOZA) 18 MG/3ML SOPN Inject into the skin. Use as directed    . metFORMIN  (GLUCOPHAGE-XR) 500 MG 24 hr tablet Take 1 tablet by mouth daily.    . simvastatin (ZOCOR) 20 MG tablet Take 20 mg by mouth every evening.    . tamsulosin (FLOMAX) 0.4 MG CAPS Take 1 capsule by mouth daily.    Marland Kitchen testosterone cypionate (DEPOTESTOTERONE CYPIONATE) 200 MG/ML injection Inject 100 mg into the muscle every 14 (fourteen) days.    . valsartan-hydrochlorothiazide (DIOVAN-HCT) 160-25 MG per tablet Take 1 tablet by mouth 2 (two) times daily.    . Multiple Vitamins-Minerals (ICAPS MV PO) Take by mouth.     No facility-administered medications prior to visit.    PHYSICAL EXAM Filed Vitals:   12/10/14 1013  BP: 140/78  Pulse: 70  Height:  (1.803 m)  Weight: 246 lb (111.585 kg)    Body mass index is 34.33 kg/(m^2).  MMSE - Mini Mental State Exam 08/01/2014 05/04/2014  Orientation to time 4 5  Orientation to Place 5 5  Registration 3 3  Attention/ Calculation 4 2  Recall 2 2  Language- name 2 objects 2 2  Language- repeat 1 1  Language- follow 3 step command 2 2  Language- read & follow direction 1 1  Write a sentence 1 1  Copy design 0 0  Total score 25 24    GENERAL EXAM: Patient is in no distress; well developed, nourished and groomed; neck is supple  CARDIOVASCULAR: Regular rate and rhythm, no murmurs, no carotid bruits  NEUROLOGIC: MENTAL STATUS: awake, alert, comprehension intact, naming intact, fund of knowledge appropriate; SLOW RESPONSES, NO FRONTAL RELEASE SIGNS; MASKED FACIES CRANIAL NERVE: PINPOINT PUPILS, pupils equal and reactive to light, visual fields full to confrontation, extraocular muscles intact, no nystagmus, facial sensation symmetric, DECR RIGHT NL FOLD, hearing intact, palate elevates symmetrically, uvula midline, shoulder shrug symmetric, tongue midline. MOTOR: normal bulk and tone, full strength in the BUE, BLE; MILD BRADYKINESIA IN BUE SENSORY: normal and symmetric to light touch  COORDINATION: finger-nose-finger, fine finger movements  normal REFLEXES: deep tendon reflexes present and symmetric GAIT/STATION: narrow based gait; DECR ARM SWING, romberg is negative   DIAGNOSTIC DATA (LABS, IMAGING, TESTING)  I reviewed images myself and agree with interpretation. -VRP  B12 791, TSH 2.14   11/08/12 MRI BRAIN - Mild periventricular and subcortical foci of T2 hyperintensities. These findings are non-specific and considerations include autoimmune, inflammatory, post-infectious, microvascular ischemia or migraine associated etiologies. No abnormal enhancing lesions.   08/18/14 MRI brain 1. Small acute-subacute left cerebellar ischemic infarction (4mm) within the postero-lateral left cerebellar hemisphere. 2. Several bilateral cerebellar chronic lacunar infarctions.  3. Mild periventricular, subcortical and juxtacortical chronic small vessel ischemic disease.   08/18/14 MRA head (without) demonstrating: 1. The right vertebral artery is hypoplastic near the skull base and then appears to be occluded for a short segment, and then has apparent reconstitution 1.7cm proximal to the vertebrobasilar junction. 2. Left vertebral artery is dominant, with normal  flow throughout the basilar artery. 3. The anterior circulation is unremarkable.  08/15/14 carotid u/s - normal  08/06/14 TTE  - Left ventricle: The cavity size was normal. Wall thickness was normal. Systolic function was normal. The estimated ejection fraction was in the range of 60% to 65%. Wall motion was normal;there were no regional wall motion abnormalities. Doppler parameters are consistent with abnormal left ventricularrelaxation (grade 1 diastolic dysfunction). - Aortic valve: There was mild regurgitation. - Pulmonary arteries: Systolic pressure was mildly increased. PA peak pressure: 40 mm Hg (S).    ASSESSMENT/PLAN:  69 y.o. male with a past medical history of Lupus and Diabetes mellitus without complication here with mild cognitive impairment with memory loss; now  with intermittent episodes of confusion, staring, jitteriness, but unclear etiology (TIA vs seizure vs daydreaming/dementia behavior). Memory loss has continued.   Ddx: mild dementia + (seizures vs TIA)  PLAN: - continue aspirin, statin, diabetes control - may consider longer EEG vs empirin anti-seizure medication at next visit - no driving (due to memory and abnl spells) - repeat MMSE at next visit  Return in about 3 months (around 03/12/2015).    Suanne Marker, MD 12/10/2014, 10:57 AM Certified in Neurology, Neurophysiology and Neuroimaging  [facility] [address], [address] [place], [place] [address] [phone]

## 2015-03-11 ENCOUNTER — Ambulatory Visit: Payer: PPO | Admitting: Diagnostic Neuroimaging

## 2015-05-06 ENCOUNTER — Ambulatory Visit: Payer: Medicare HMO | Admitting: Diagnostic Neuroimaging

## 2015-05-17 ENCOUNTER — Encounter: Payer: Self-pay | Admitting: Diagnostic Neuroimaging

## 2015-05-17 ENCOUNTER — Ambulatory Visit (INDEPENDENT_AMBULATORY_CARE_PROVIDER_SITE_OTHER): Payer: PPO | Admitting: Diagnostic Neuroimaging

## 2015-05-17 VITALS — BP 114/69 | HR 76 | Ht 71.0 in | Wt 233.4 lb

## 2015-05-17 DIAGNOSIS — F0390 Unspecified dementia without behavioral disturbance: Secondary | ICD-10-CM | POA: Diagnosis not present

## 2015-05-17 DIAGNOSIS — F03A0 Unspecified dementia, mild, without behavioral disturbance, psychotic disturbance, mood disturbance, and anxiety: Secondary | ICD-10-CM

## 2015-05-17 NOTE — Progress Notes (Signed)
PATIENT: David Irwin DOB: [birth year]  REASON FOR VISIT: routine follow up for memory HISTORY FROM: patient, wife  Chief Complaint  Patient presents with  . Memory Loss     HISTORY OF PRESENT ILLNESS:  UPDATE 05/17/15: Since last visit, memory loss continues. Daydreaming staring speels are less often. Short term memory is poor. Long term memory stable. Gait stable.   UPDATE 12/10/14: Since last visit, doing about the same. 2 weeks ago patient had episode of staring spell 2, lasting less than a minute, when patient's wife was talking to him. Wife noted that he seemed to be acting differently. He was closing his eyes and shaking his head. She asked him what was wrong and he did not respond. She insisted on him trying to explain what was going on. He moved to stretch his muscles, rolled his neck, and finally was able to say some words. However his verbal communication was not related to what his wife was asking about. He seemed to be talking about tangential topics. This lasted just for less than a minute or so. The day before patient had been trying a new pain medication, but he does not recall the name of it. Overall general memory loss has slightly progressed.  UPDATE 09/10/14: Since last visit, was doing well, except 1 more brief staring spell event few nights ago. Patient was able to respond verbally when wife called to him. Spell lasted < . No shaking.   UPDATE 08/01/14 (VRP): Since last visit, was stable, except has had intermittent spells according to wife: staring, inability to speak, jittery movements of head and hands, lasting 1 minute; no tongue biting or incontinence. No post-ictal confusion. No warning. Has had spells 1 every 3-4 months x 1 year, and then 2 spells in last 2 weeks. No triggers  UPDATE 05/04/14 (LL): Since last visit, patient has been well, but recognizes he has more difficulty remembering places he had been in the last year or two. He does not remember coming  to this office one year ago. He has had no decline in activities of daily living. His blood sugars are well controlled, hemoglobin A1c is 6.0. He does not get regular exercise.  He has sleep apnea and has a CPAP machine and tries to use it but states that the full mask is uncomfortable. MMSE 24/30, AFT 12 and CDT 4/4.  UPDATE 05/02/13 (LL): Patient and wife come to office for 6 month follow up for memory testing. Both feel that memory has been stable, no more instances of getting lost while driving. Patient feels like he has not gotten any worse, he has trouble remembering things that he knows sometimes, but he states it usually comes to him with time. Just not on "his" time.   PRIOR HPI (10/28/12, VRP): 69 year old right-handed male with history of lupus, diabetes, hypercholesterolemia, and hypertension, here for evaluation of memory loss. Summary 2013, patient got lost while driving twice [place] [address]. He was briefly turned around but eventually was able to make his way to his destination. He had a third episode in December 2013. Since that time he has had no further events. His wife has noticed some short-term memory problems especially with verbal and auditory memory. One time he forgot his granddaughter's name (she is 104 years old and lives [place]). He has 3 grandchildren total. Patient denies any significant sleep or mood problems. Patient's wife reports some mild mood swings.    REVIEW OF SYSTEMS: Full 14 system review of  systems performed and notable only for: memory loss neck pain back pain joint pain/swelling.    ALLERGIES: No Known Allergies  HOME MEDICATIONS: Outpatient Prescriptions Prior to Visit  Medication Sig Dispense Refill  . aspirin 81 MG tablet Take 81 mg by mouth daily.    . CYANOCOBALAMIN PO Take by mouth daily.    . fish oil-omega-3 fatty acids 1000 MG capsule 3 g daily.    . hydroxychloroquine (PLAQUENIL) 200 MG tablet Take by mouth daily.    Marland Kitchen KLOR-CON M20 20  MEQ tablet Take 1 tablet by mouth daily.    . Liraglutide (VICTOZA) 18 MG/3ML SOPN Inject into the skin. Use as directed    . metFORMIN (GLUCOPHAGE-XR) 500 MG 24 hr tablet Take 1 tablet by mouth daily.    . simvastatin (ZOCOR) 20 MG tablet Take 20 mg by mouth every evening.    . tamsulosin (FLOMAX) 0.4 MG CAPS Take 1 capsule by mouth daily.    . valsartan-hydrochlorothiazide (DIOVAN-HCT) 160-25 MG per tablet Take 1 tablet by mouth 2 (two) times daily.    . pioglitazone (ACTOS) 45 MG tablet Take 45 mg by mouth daily.    Marland Kitchen testosterone cypionate (DEPOTESTOTERONE CYPIONATE) 200 MG/ML injection Inject 100 mg into the muscle every 14 (fourteen) days.     No facility-administered medications prior to visit.    PHYSICAL EXAM Filed Vitals:   05/17/15 1002  BP: 114/69  Pulse: 76  Height:  (1.803 m)  Weight: 233 lb 6.4 oz (105.87 kg)    Body mass index is 32.57 kg/(m^2).  MMSE - Mini Mental State Exam 05/17/2015 08/01/2014 05/04/2014  Orientation to time Orientation to Place Registration Attention/ Calculation Recall Language- name 2 objects Language- repeat Language- follow 3 step command Language- read & follow direction Write a sentence Copy design 0 0 0  Total score GENERAL EXAM: Patient is in no distress; well developed, nourished and groomed; neck is supple  CARDIOVASCULAR: Regular rate and rhythm, no murmurs, no carotid bruits  NEUROLOGIC: MENTAL STATUS: awake, alert, comprehension intact, naming intact, fund of knowledge appropriate; SLOW RESPONSES, NO FRONTAL RELEASE SIGNS; MASKED FACIES CRANIAL NERVE: PINPOINT PUPILS, pupils equal and reactive to light, visual fields full to confrontation, extraocular muscles intact, no nystagmus, facial sensation symmetric, DECR RIGHT NL FOLD, hearing intact, palate elevates symmetrically, uvula midline, shoulder shrug symmetric, tongue  midline. MOTOR: normal bulk and tone, full strength in the BUE, BLE; MILD BRADYKINESIA IN BUE SENSORY: normal and symmetric to light touch  COORDINATION: finger-nose-finger, fine finger movements normal REFLEXES: deep tendon reflexes present and symmetric GAIT/STATION: narrow based gait; SLOW GAIT; DECR ARM SWING, romberg is negative   DIAGNOSTIC DATA (LABS, IMAGING, TESTING)  LABS: B12 791, TSH 2.14   11/08/12 MRI BRAIN - Mild periventricular and subcortical foci of T2 hyperintensities. These findings are non-specific and considerations include autoimmune, inflammatory, post-infectious, microvascular ischemia or migraine associated etiologies. No abnormal enhancing lesions.   08/18/14 MRI brain 1. Small acute-subacute left cerebellar ischemic infarction (4mm) within the postero-lateral left cerebellar hemisphere. 2. Several bilateral cerebellar chronic lacunar infarctions.  3. Mild periventricular, subcortical and juxtacortical chronic small vessel ischemic disease.   08/18/14 MRA head (without) demonstrating: 1. The right vertebral artery is hypoplastic near the skull  base and then appears to be occluded for a short segment, and then has apparent reconstitution 1.7cm proximal to the vertebrobasilar junction. 2. Left vertebral artery is dominant, with normal flow throughout the basilar artery. 3. The anterior circulation is unremarkable.  08/15/14 carotid u/s - normal  08/06/14 TTE  - Left ventricle: The cavity size was normal. Wall thickness was normal. Systolic function was normal. The estimated ejection fraction was in the range of 60% to 65%. Wall motion was normal;there were no regional wall motion abnormalities. Doppler parameters are consistent with abnormal left ventricularrelaxation (grade 1 diastolic dysfunction). - Aortic valve: There was mild regurgitation. - Pulmonary arteries: Systolic pressure was mildly increased. PA peak pressure: 40 mm Hg  (S).    ASSESSMENT/PLAN:  69 y.o. male with a past medical history of Lupus and Diabetes mellitus here with mild cognitive impairment with memory loss; has had some intermittent episodes of confusion, staring, jitteriness, but unclear etiology (? daydreaming/dementia behavior). Memory loss has continued.   Ddx: mild dementia + (? Daydreaming or dementia behaviors)   PLAN: I spent 25 minutes of face to face time with patient. Greater than 50% of time was spent in counseling and coordination of care with patient. In summary we discussed:  - continue aspirin, statin, diabetes control - no driving (due to memory and abnl spells) - consider CREAD study - in future may consider donepezil or memantine; patient reluctant to add new meds at this time  Return in about 3 months (around 08/17/2015).    Suanne Marker, MD 05/17/2015, 11:14 AM Certified in Neurology, Neurophysiology and Neuroimaging  [facility] [address] [place], [place] [address] [phone]

## 2015-05-17 NOTE — Patient Instructions (Signed)
Consider CREAD study (crenazumab).  Focus on mental, social/emotional and physical stimulation. Try learning new activities (arts, music, language, games).  Move your body often!  Eat more plants, vegetables, nuts, seeds, berries (blueberry, blackberry, raspberry).  Eat less processed, fried, salty and sugary foods.   Try to get at least 7-8+ hours sleep per day.  Caution with driving, finances, day-to-day activities.  Prepare estate planning, living will, healthcare POA documents with an attorney.

## 2015-08-16 ENCOUNTER — Encounter: Payer: Self-pay | Admitting: Diagnostic Neuroimaging

## 2015-08-16 ENCOUNTER — Ambulatory Visit (INDEPENDENT_AMBULATORY_CARE_PROVIDER_SITE_OTHER): Payer: PPO | Admitting: Diagnostic Neuroimaging

## 2015-08-16 VITALS — BP 108/65 | HR 77 | Ht 71.0 in | Wt 232.6 lb

## 2015-08-16 DIAGNOSIS — G4733 Obstructive sleep apnea (adult) (pediatric): Secondary | ICD-10-CM | POA: Diagnosis not present

## 2015-08-16 DIAGNOSIS — F329 Major depressive disorder, single episode, unspecified: Secondary | ICD-10-CM | POA: Diagnosis not present

## 2015-08-16 DIAGNOSIS — Z9989 Dependence on other enabling machines and devices: Principal | ICD-10-CM

## 2015-08-16 DIAGNOSIS — R413 Other amnesia: Secondary | ICD-10-CM

## 2015-08-16 DIAGNOSIS — F32A Depression, unspecified: Secondary | ICD-10-CM

## 2015-08-16 NOTE — Patient Instructions (Addendum)
Thank you for coming to see [place] at [facility]. I hope we have been able to provide you high quality care today.  You may receive a patient satisfaction survey over the next few weeks. We would appreciate your feedback and comments so that we may continue to improve ourselves and the health of our patients.  - continue CPAP and medications - I will refer you to psychiatry/psychology for depression treatment   ~~~~~~~~~~~~~~~~~~~~~~~~~~~~~~~~~~~~~~~~~~~~~~~~~~~~~~~~~~~~~~~~~  DR. Reghan Thul'S GUIDE TO HAPPY AND HEALTHY LIVING These are some of my general health and wellness recommendations. Some of them may apply to you better than others. Please use common sense as you try these suggestions and feel free to ask me any questions.   ACTIVITY/FITNESS Mental, social, emotional and physical stimulation are very important for brain and body health. Try learning a new activity (arts, music, language, sports, games).  Keep moving your body to the best of your abilities.    NUTRITION Eat more plants: colorful vegetables, nuts, seeds and berries.  Eat less sugar, salt, preservatives and processed foods.  Avoid toxins such as cigarettes and alcohol.  Drink water when you are thirsty. Warm water with a slice of lemon is an excellent morning drink to start the day.  Consider these websites for more information The Nutrition Source (https://www.henry-hernandez.biz/) [employer] Nutrition (WindowBlog.ch)   RELAXATION Consider practicing mindfulness meditation or other relaxation techniques such as deep breathing, prayer, yoga, tai chi, massage. See website mindful.org or the apps Headspace or Calm to help get started.   SLEEP Try to get at least 7-8+ hours sleep per day. Regular exercise and reduced caffeine will help you sleep better. Practice good sleep hygeine techniques. See website sleep.org for more  information.   PLANNING Prepare estate planning, living will, healthcare POA documents. Sometimes this is best planned with the help of an attorney. Theconversationproject.org and agingwithdignity.org are excellent resources.

## 2015-08-16 NOTE — Progress Notes (Signed)
PATIENT: David Irwin DOB: [birth year]  REASON FOR VISIT: routine follow up for memory HISTORY FROM: patient, wife  Chief Complaint  Patient presents with  . Follow-up    Room #6.  He is here with his wife, [name].  . Memory Loss    MMSE 27/30 - 15 animals.  Feels memory is about the same since his last visit.     HISTORY OF PRESENT ILLNESS:  UPDATE 08/16/15: Since last visit, memory loss is stable / slightly improved especially since restarting CPAP.  Wife also noted deep depression   UPDATE 05/17/15: Since last visit, memory loss continues. Daydreaming staring speels are less often. Short term memory is poor. Long term memory stable. Gait stable.   UPDATE 12/10/14: Since last visit, doing about the same. 2 weeks ago patient had episode of staring spell 2, lasting less than a minute, when patient's wife was talking to him. Wife noted that he seemed to be acting differently. He was closing his eyes and shaking his head. She asked him what was wrong and he did not respond. She insisted on him trying to explain what was going on. He moved to stretch his muscles, rolled his neck, and finally was able to say some words. However his verbal communication was not related to what his wife was asking about. He seemed to be talking about tangential topics. This lasted just for less than a minute or so. The day before patient had been trying a new pain medication, but he does not recall the name of it. Overall general memory loss has slightly progressed.  UPDATE 09/10/14: Since last visit, was doing well, except 1 more brief staring spell event few nights ago. Patient was able to respond verbally when wife called to him. Spell lasted < . No shaking.   UPDATE 08/01/14 (VRP): Since last visit, was stable, except has had intermittent spells according to wife: staring, inability to speak, jittery movements of head and hands, lasting 1 minute; no tongue biting or incontinence. No post-ictal confusion.  No warning. Has had spells 1 every 3-4 months x 1 year, and then 2 spells in last 2 weeks. No triggers  UPDATE 05/04/14 (LL): Since last visit, patient has been well, but recognizes he has more difficulty remembering places he had been in the last year or two. He does not remember coming to this office one year ago. He has had no decline in activities of daily living. His blood sugars are well controlled, hemoglobin A1c is 6.0. He does not get regular exercise.  He has sleep apnea and has a CPAP machine and tries to use it but states that the full mask is uncomfortable. MMSE 24/30, AFT 12 and CDT 4/4.  UPDATE 05/02/13 (LL): Patient and wife come to office for 6 month follow up for memory testing. Both feel that memory has been stable, no more instances of getting lost while driving. Patient feels like he has not gotten any worse, he has trouble remembering things that he knows sometimes, but he states it usually comes to him with time. Just not on "his" time.   PRIOR HPI (10/28/12, VRP): 69 year old right-handed male with history of lupus, diabetes, hypercholesterolemia, and hypertension, here for evaluation of memory loss. Summary 2013, patient got lost while driving twice on [address]. He was briefly turned around but eventually was able to make his way to his destination. He had a third episode in December 2013. Since that time he has had no further events. His  wife has noticed some short-term memory problems especially with verbal and auditory memory. One time he forgot his granddaughter's name (she is 67 years old and [place] He has 3 grandchildren total. Patient denies any significant sleep or mood problems. Patient's wife reports some mild mood swings.    REVIEW OF SYSTEMS: Full 14 system review of systems performed and notable only for: memory loss neck pain back pain joint pain/swelling.    ALLERGIES: No Known Allergies  HOME MEDICATIONS: Outpatient Prescriptions Prior to  Visit  Medication Sig Dispense Refill  . aspirin 81 MG tablet Take 81 mg by mouth daily.    . CYANOCOBALAMIN PO Take by mouth daily.    . fish oil-omega-3 fatty acids 1000 MG capsule 3 g daily.    . hydroxychloroquine (PLAQUENIL) 200 MG tablet Take by mouth daily.    Marland Kitchen. KLOR-CON M20 20 MEQ tablet Take 1 tablet by mouth daily.    . Liraglutide (VICTOZA) 18 MG/3ML SOPN Inject into the skin. Use as directed    . metFORMIN (GLUCOPHAGE-XR) 500 MG 24 hr tablet Take 1 tablet by mouth daily.    . simvastatin (ZOCOR) 20 MG tablet Take 20 mg by mouth every evening.    . tamsulosin (FLOMAX) 0.4 MG CAPS Take 1 capsule by mouth daily.    . valsartan-hydrochlorothiazide (DIOVAN-HCT) 160-25 MG per tablet Take 1 tablet by mouth 2 (two) times daily.     No facility-administered medications prior to visit.    PHYSICAL EXAM Filed Vitals:   08/16/15 1020  BP: 108/65  Pulse: 77  Height: 5\' 11"  (1.803 m)  Weight: 232 lb 9.6 oz (105.507 kg)    Body mass index is 32.46 kg/(m^2).  MMSE - Mini Mental State Exam 08/16/2015 05/17/2015 08/01/2014 05/04/2014  Orientation to time 3 4 4 5   Orientation to Place 4 5 5 5   Registration 3 3 3 3   Attention/ Calculation 5 1 4 2   Recall 3 2 2 2   Language- name 2 objects 2 2 2 2   Language- repeat 1 1 1 1   Language- follow 3 step command 3 2 2 2   Language- read & follow direction 1 1 1 1   Write a sentence 1 1 1 1   Copy design 1 0 0 0  Total score 27 22 25 24     GENERAL EXAM: Patient is in no distress; well developed, nourished and groomed; neck is supple; CALM, SOFT SPOKEN  CARDIOVASCULAR: Regular rate and rhythm, no murmurs, no carotid bruits  NEUROLOGIC: MENTAL STATUS: awake, alert, comprehension intact, naming intact, fund of knowledge appropriate; SLOW RESPONSES, NO FRONTAL RELEASE SIGNS; MASKED FACIES CRANIAL NERVE: PINPOINT PUPILS, pupils equal and reactive to light, visual fields full to confrontation, extraocular muscles intact, no nystagmus, facial  sensation symmetric, DECR RIGHT NL FOLD, hearing intact, palate elevates symmetrically, uvula midline, shoulder shrug symmetric, tongue midline. MOTOR: normal bulk and tone, full strength in the BUE, BLE; MILD BRADYKINESIA IN BUE SENSORY: normal and symmetric to light touch  COORDINATION: finger-nose-finger, fine finger movements normal REFLEXES: deep tendon reflexes present and symmetric GAIT/STATION: narrow based gait; SLOW GAIT; DECR ARM SWING, romberg is negative   DIAGNOSTIC DATA (LABS, IMAGING, TESTING)  LABS: B12 791, TSH 2.14   11/08/12 MRI BRAIN - Mild periventricular and subcortical foci of T2 hyperintensities. These findings are non-specific and considerations include autoimmune, inflammatory, post-infectious, microvascular ischemia or migraine associated etiologies. No abnormal enhancing lesions.   08/18/14 MRI brain 1. Small acute-subacute left cerebellar ischemic infarction (4mm) within the postero-lateral  left cerebellar hemisphere. 2. Several bilateral cerebellar chronic lacunar infarctions.  3. Mild periventricular, subcortical and juxtacortical chronic small vessel ischemic disease.   08/18/14 MRA head (without) demonstrating: 1. The right vertebral artery is hypoplastic near the skull base and then appears to be occluded for a short segment, and then has apparent reconstitution 1.7cm proximal to the vertebrobasilar junction. 2. Left vertebral artery is dominant, with normal flow throughout the basilar artery. 3. The anterior circulation is unremarkable.  08/15/14 carotid u/s - normal  08/06/14 TTE  - Left ventricle: The cavity size was normal. Wall thickness was normal. Systolic function was normal. The estimated ejection fraction was in the range of 60% to 65%. Wall motion was normal;there were no regional wall motion abnormalities. Doppler parameters are consistent with abnormal left ventricularrelaxation (grade 1 diastolic dysfunction). - Aortic valve: There was mild  regurgitation. - Pulmonary arteries: Systolic pressure was mildly increased. PA peak pressure: 40 mm Hg (S).    ASSESSMENT/PLAN:  69 y.o. male with a past medical history of Lupus and Diabetes mellitus here with mild cognitive impairment with memory loss; has had some intermittent episodes of confusion, staring, jitteriness, but unclear etiology (? daydreaming/dementia behavior). Memory loss has fluctuated, but slightly better with restarting CPAP for OSA. Now with more apparent depression.   Ddx: mild dementia vs pseudo-dementia of depression   PLAN: - continue aspirin, statin, diabetes control - no driving (due to memory and abnl spells) - refer to psychiatry/psychology for depression - in future may consider donepezil or memantine; patient reluctant to add new meds at this time  Return in about 6 months (around 02/13/2016).    Suanne Marker, MD 08/16/2015, 10:45 AM Certified in Neurology, Neurophysiology and Neuroimaging  [facility] [address] [place], [place] [address] [phone]

## 2015-08-19 ENCOUNTER — Telehealth: Payer: Self-pay | Admitting: Diagnostic Neuroimaging

## 2015-08-19 NOTE — Telephone Encounter (Signed)
Called [employer] and left message about who patient is covered threw for physiatrist in [place].

## 2015-10-09 DIAGNOSIS — L219 Seborrheic dermatitis, unspecified: Secondary | ICD-10-CM | POA: Diagnosis not present

## 2015-10-09 DIAGNOSIS — R202 Paresthesia of skin: Secondary | ICD-10-CM | POA: Diagnosis not present

## 2015-10-09 DIAGNOSIS — Z23 Encounter for immunization: Secondary | ICD-10-CM | POA: Diagnosis not present

## 2015-10-09 DIAGNOSIS — L930 Discoid lupus erythematosus: Secondary | ICD-10-CM | POA: Diagnosis not present

## 2015-10-25 DIAGNOSIS — I10 Essential (primary) hypertension: Secondary | ICD-10-CM | POA: Diagnosis not present

## 2015-10-25 DIAGNOSIS — Z0000 Encounter for general adult medical examination without abnormal findings: Secondary | ICD-10-CM | POA: Diagnosis not present

## 2015-10-25 DIAGNOSIS — E118 Type 2 diabetes mellitus with unspecified complications: Secondary | ICD-10-CM | POA: Diagnosis not present

## 2015-10-25 DIAGNOSIS — Z125 Encounter for screening for malignant neoplasm of prostate: Secondary | ICD-10-CM | POA: Diagnosis not present

## 2015-11-06 DIAGNOSIS — N390 Urinary tract infection, site not specified: Secondary | ICD-10-CM | POA: Diagnosis not present

## 2015-11-06 DIAGNOSIS — I10 Essential (primary) hypertension: Secondary | ICD-10-CM | POA: Diagnosis not present

## 2015-11-06 DIAGNOSIS — E118 Type 2 diabetes mellitus with unspecified complications: Secondary | ICD-10-CM | POA: Diagnosis not present

## 2015-11-14 DIAGNOSIS — G4733 Obstructive sleep apnea (adult) (pediatric): Secondary | ICD-10-CM | POA: Diagnosis not present

## 2016-01-08 DIAGNOSIS — M329 Systemic lupus erythematosus, unspecified: Secondary | ICD-10-CM | POA: Diagnosis not present

## 2016-01-08 DIAGNOSIS — M5136 Other intervertebral disc degeneration, lumbar region: Secondary | ICD-10-CM | POA: Diagnosis not present

## 2016-01-08 DIAGNOSIS — M150 Primary generalized (osteo)arthritis: Secondary | ICD-10-CM | POA: Diagnosis not present

## 2016-01-22 DIAGNOSIS — E119 Type 2 diabetes mellitus without complications: Secondary | ICD-10-CM | POA: Diagnosis not present

## 2016-01-22 DIAGNOSIS — H02403 Unspecified ptosis of bilateral eyelids: Secondary | ICD-10-CM | POA: Diagnosis not present

## 2016-01-22 DIAGNOSIS — Z961 Presence of intraocular lens: Secondary | ICD-10-CM | POA: Diagnosis not present

## 2016-01-22 DIAGNOSIS — H40013 Open angle with borderline findings, low risk, bilateral: Secondary | ICD-10-CM | POA: Diagnosis not present

## 2016-01-22 DIAGNOSIS — H35031 Hypertensive retinopathy, right eye: Secondary | ICD-10-CM | POA: Diagnosis not present

## 2016-01-22 DIAGNOSIS — H35032 Hypertensive retinopathy, left eye: Secondary | ICD-10-CM | POA: Diagnosis not present

## 2016-01-29 ENCOUNTER — Ambulatory Visit (INDEPENDENT_AMBULATORY_CARE_PROVIDER_SITE_OTHER): Payer: PPO | Admitting: Diagnostic Neuroimaging

## 2016-01-29 ENCOUNTER — Encounter: Payer: Self-pay | Admitting: Diagnostic Neuroimaging

## 2016-01-29 VITALS — BP 146/80 | HR 68 | Resp 16 | Wt 244.8 lb

## 2016-01-29 DIAGNOSIS — R413 Other amnesia: Secondary | ICD-10-CM | POA: Diagnosis not present

## 2016-01-29 DIAGNOSIS — G4733 Obstructive sleep apnea (adult) (pediatric): Secondary | ICD-10-CM

## 2016-01-29 DIAGNOSIS — F329 Major depressive disorder, single episode, unspecified: Secondary | ICD-10-CM

## 2016-01-29 DIAGNOSIS — F32A Depression, unspecified: Secondary | ICD-10-CM

## 2016-01-29 DIAGNOSIS — Z9989 Dependence on other enabling machines and devices: Secondary | ICD-10-CM

## 2016-01-29 NOTE — Progress Notes (Signed)
PATIENT: David BoozeGeorge Irwin DOB: 02/07/1946  REASON FOR VISIT: routine follow up for memory HISTORY FROM: patient, wife  Chief Complaint  Patient presents with  . Memory Loss    Sts. he feels memory is the same or worse./fim  . MMSE    26/30     HISTORY OF PRESENT ILLNESS:  UPDATE 01/29/16: Since last visit, doing well, memory loss, but still with abnl spells (usually from waking up state) --> 3 episodes since last visit. 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   UPDATE 08/16/15: Since last visit, memory loss is stable / slightly improved especially since restarting CPAP.  Wife also noted deep depression   UPDATE 05/17/15: Since last visit, memory loss continues. Daydreaming staring spells are less often. Short term memory is poor. Long term memory stable. Gait stable.   UPDATE 12/10/14: Since last visit, doing about the same. 2 weeks ago patient had episode of staring spell 2, lasting less than a minute, when patient's wife was talking to him. Wife noted that he seemed to be acting differently. He was closing his eyes and shaking his head. She asked him what was wrong and he did not respond. She insisted on him trying to explain what was going on. He moved to stretch his muscles, rolled his neck, and finally was able to say some words. However his verbal communication was not related to what his wife was asking about. He seemed to be talking about tangential topics. This lasted just for less than a minute or so. The day before patient had been trying a new pain medication, but he does not recall the name of it. Overall general memory loss has slightly progressed.  UPDATE 09/10/14: Since last visit, was doing well, except 1 more brief staring spell event few nights ago. Patient was able to respond verbally when wife called to him. Spell lasted < 1min. No shaking.   UPDATE 08/01/14 (VRP): Since last visit, was stable, except has had intermittent spells according to wife:  staring, inability to speak, jittery movements of head and hands, lasting 1 minute; no tongue biting or incontinence. No post-ictal confusion. No warning. Has had spells 1 every 3-4 months x 1 year, and then 2 spells in last 2 weeks. No triggers  UPDATE 05/04/14 (LL): Since last visit, patient has been well, but recognizes he has more difficulty remembering places he had been in the last year or two. He does not remember coming to this office one year ago. He has had no decline in activities of daily living. His blood sugars are well controlled, hemoglobin A1c is 6.0. He does not get regular exercise.  He has sleep apnea and has a CPAP machine and tries to use it but states that the full mask is uncomfortable. MMSE 24/30, AFT 12 and CDT 4/4.  UPDATE 05/02/13 (LL): Patient and wife come to office for 6 month follow up for memory testing. Both feel that memory has been stable, no more instances of getting lost while driving. Patient feels like he has not gotten any worse, he has trouble remembering things that he knows sometimes, but he states it usually comes to him with time. Just not on "his" time.   PRIOR HPI (10/28/12, VRP): 70 year old right-handed male with history of lupus, diabetes, hypercholesterolemia, and hypertension, here for evaluation of memory loss. Summary 2013, patient got lost while driving twice [place] [address]. He was briefly turned around but eventually was able to make his way to his  destination. He had a third episode in December 2013. Since that time he has had no further events. His wife has noticed some short-term memory problems especially with verbal and auditory memory. One time he forgot his granddaughter's name (she is 33 years old and lives [place]). He has 3 grandchildren total. Patient denies any significant sleep or mood problems. Patient's wife reports some mild mood swings.    REVIEW OF SYSTEMS: Full 14 system review of systems performed and negative except for as  per HPI.     ALLERGIES: No Known Allergies  HOME MEDICATIONS: Outpatient Prescriptions Prior to Visit  Medication Sig Dispense Refill  . aspirin 81 MG tablet Take 81 mg by mouth daily.    . CYANOCOBALAMIN PO Take by mouth daily.    . fish oil-omega-3 fatty acids 1000 MG capsule 3 g daily.    . hydroxychloroquine (PLAQUENIL) 200 MG tablet Take by mouth daily.    Marland Kitchen KLOR-CON M20 20 MEQ tablet Take 1 tablet by mouth daily.    . Liraglutide (VICTOZA) 18 MG/3ML SOPN Inject into the skin. Use as directed    . metFORMIN (GLUCOPHAGE-XR) 500 MG 24 hr tablet Take 1 tablet by mouth daily.    . simvastatin (ZOCOR) 20 MG tablet Take 20 mg by mouth every evening.    . tamsulosin (FLOMAX) 0.4 MG CAPS Take 1 capsule by mouth daily.    . valsartan-hydrochlorothiazide (DIOVAN-HCT) 160-25 MG per tablet Take 1 tablet by mouth 2 (two) times daily.     No facility-administered medications prior to visit.    PHYSICAL EXAM Filed Vitals:   01/29/16 1310  BP: 146/80  Pulse: 68  Resp: 16  Weight: 244 lb 12.8 oz (111.041 kg)    Body mass index is 34.16 kg/(m^2).  MMSE - Mini Mental State Exam 01/29/2016 08/16/2015 05/17/2015 08/01/2014 05/04/2014  Orientation to time Orientation to Place Registration Attention/ Calculation Recall Language- name 2 objects Language- repeat Language- follow 3 step command Language- read & follow direction Write a sentence Copy design 1 1 0 0 0  Total score GENERAL EXAM: Patient is in no distress; well developed, nourished and groomed; neck is supple; CALM, SOFT SPOKEN  CARDIOVASCULAR: Regular rate and rhythm, no murmurs, no carotid bruits  NEUROLOGIC: MENTAL STATUS: awake, alert, comprehension intact, naming intact, fund of knowledge appropriate; SLOW RESPONSES, NO FRONTAL RELEASE SIGNS; MASKED FACIES CRANIAL NERVE: PINPOINT PUPILS,  pupils equal and reactive to light, visual fields full to confrontation, extraocular muscles intact, no nystagmus, facial sensation symmetric, DECR RIGHT NL FOLD, hearing intact, palate elevates symmetrically, uvula midline, shoulder shrug symmetric, tongue midline. MOTOR: normal bulk and tone, full strength in the BUE, BLE; MILD BRADYKINESIA IN BUE SENSORY: normal and symmetric to light touch  COORDINATION: finger-nose-finger, fine finger movements normal REFLEXES: deep tendon reflexes present and symmetric GAIT/STATION: narrow based gait; SLOW GAIT; DECR ARM SWING, romberg is negative   DIAGNOSTIC DATA (LABS, IMAGING, TESTING)  CBC No results found for: WBC, RBC, HGB, HCT, PLT, MCV, MCH, MCHC, RDW, LYMPHSABS, MONOABS, EOSABS, BASOSABS  No flowsheet data found.   LABS: B12 791, TSH 2.14  11/08/12 MRI BRAIN - Mild periventricular and subcortical foci of T2 hyperintensities. These findings are non-specific and considerations include autoimmune, inflammatory, post-infectious, microvascular ischemia or migraine associated etiologies. No abnormal enhancing lesions.   08/18/14 MRI brain 1. Small acute-subacute left cerebellar ischemic infarction (4mm) within the postero-lateral left cerebellar hemisphere. 2. Several bilateral cerebellar chronic lacunar infarctions.  3. Mild periventricular, subcortical and juxtacortical chronic small vessel ischemic disease.   08/18/14 MRA head (without) demonstrating: 1. The right vertebral artery is hypoplastic near the skull base and then appears to be occluded for a short segment, and then has apparent reconstitution 1.7cm proximal to the vertebrobasilar junction. 2. Left vertebral artery is dominant, with normal flow throughout the basilar artery. 3. The anterior circulation is unremarkable.  08/15/14 carotid u/s - normal  08/06/14 TTE  - Left ventricle: The cavity size was normal. Wall thickness was normal. Systolic function was normal. The estimated  ejection fraction was in the range of 60% to 65%. Wall motion was normal;there were no regional wall motion abnormalities. Doppler parameters are consistent with abnormal left ventricularrelaxation (grade 1 diastolic dysfunction). - Aortic valve: There was mild regurgitation. - Pulmonary arteries: Systolic pressure was mildly increased. PA peak pressure: 40 mm Hg (S).    ASSESSMENT/PLAN:  69 y.o. male with a past medical history of Lupus and Diabetes mellitus here with mild cognitive impairment with memory loss; has had some intermittent episodes of confusion, staring, jitteriness, but unclear etiology (? daydreaming/dementia behavior).   Memory loss has fluctuated, but slightly better with restarting CPAP for OSA. Also with depression.    Ddx: MCI vs pseudo-dementia of depression  Memory loss  OSA on CPAP  Depression    PLAN: - continue aspirin, statin, diabetes control - no driving (due to memory and abnl spells)  Return in about 1 year (around 01/28/2017).    Suanne Marker, MD 01/29/2016, 1:39 PM Certified in Neurology, Neurophysiology and Neuroimaging  [facility] [address], [address] [place], [place] [address] [phone]

## 2016-01-29 NOTE — Patient Instructions (Signed)

## 2016-02-09 ENCOUNTER — Encounter (HOSPITAL_COMMUNITY): Payer: Self-pay | Admitting: Emergency Medicine

## 2016-02-09 ENCOUNTER — Ambulatory Visit (HOSPITAL_COMMUNITY)
Admission: EM | Admit: 2016-02-09 | Discharge: 2016-02-09 | Disposition: A | Discharge status: Home / Self Care | Payer: PPO | Attending: Family Medicine | Admitting: Family Medicine

## 2016-02-09 DIAGNOSIS — L089 Local infection of the skin and subcutaneous tissue, unspecified: Secondary | ICD-10-CM

## 2016-02-09 DIAGNOSIS — L729 Follicular cyst of the skin and subcutaneous tissue, unspecified: Secondary | ICD-10-CM

## 2016-02-09 MED ORDER — LIDOCAINE-EPINEPHRINE (PF) 2 %-1:200000 IJ SOLN
INTRAMUSCULAR | Status: AC
  Filled 2016-02-09: qty 20

## 2016-02-09 MED ORDER — DOXYCYCLINE HYCLATE 100 MG PO CAPS: 100.0000 mg | ORAL_CAPSULE | Freq: Two times a day (BID) | ORAL | Status: DC

## 2016-02-09 NOTE — Discharge Instructions (Signed)
Keep area clean and dry. Wash with warm soap and water. Warm compresses 3-4 times a day. Take your medication as directed. For worsening or new symptoms or problems may return.

## 2016-02-09 NOTE — ED Notes (Signed)
Applied 4x4 gauze and secured w/hypofix 

## 2016-02-09 NOTE — ED Notes (Signed)
C/o abscess on right shoulder onset x4 days associated w/pain and drainage A&O x4... No acute distress.

## 2016-02-09 NOTE — ED Provider Notes (Signed)
CSN: [id number]     Arrival date & time 02/09/16  1618 History   First MD Initiated Contact with Patient 02/09/16 1745     Chief Complaint  Patient presents with  . Abscess   (Consider location/radiation/quality/duration/timing/severity/associated sxs/prior Treatment) HPI Comments: C/O "sore" on the left shoulder noticed yesterday as it became tender  And painful. Hx of infected skin lesions to the back.   Past Medical History  Diagnosis Date  . Lupus (HCC)   . Diabetes mellitus without complication [facility])    Past Surgical History  Procedure Laterality Date  . Eye surgery     Family History  Problem Relation Age of Onset  . Kidney disease Mother   . Dementia Father    Social History  Substance Use Topics  . Smoking status: Never Smoker   . Smokeless tobacco: Never Used  . Alcohol Use: No    Review of Systems  Constitutional: Negative.   Respiratory: Negative.   Musculoskeletal: Negative.   Skin:       As per HPI  Hematological: Negative for adenopathy. Does not bruise/bleed easily.  All other systems reviewed and are negative.   Allergies  Review of patient's allergies indicates no known allergies.  Home Medications   Prior to Admission medications   Medication Sig Start Date End Date Taking? Authorizing Provider  fish oil-omega-3 fatty acids 1000 MG capsule 3 g daily.   Yes Historical Provider, MD  hydroxychloroquine (PLAQUENIL) 200 MG tablet Take by mouth daily.   Yes Historical Provider, MD  KLOR-CON M20 20 MEQ tablet Take 1 tablet by mouth daily. 04/27/13  Yes Historical Provider, MD  Liraglutide (VICTOZA) 18 MG/3ML SOPN Inject into the skin. Use as directed   Yes Historical Provider, MD  metFORMIN (GLUCOPHAGE-XR) 500 MG 24 hr tablet Take 1 tablet by mouth daily. 03/25/13  Yes Historical Provider, MD  tamsulosin (FLOMAX) 0.4 MG CAPS Take 1 capsule by mouth daily. 04/27/13  Yes Historical Provider, MD  valsartan-hydrochlorothiazide (DIOVAN-HCT) 160-25 MG per  tablet Take 1 tablet by mouth 2 (two) times daily. 04/27/13  Yes Historical Provider, MD  aspirin 81 MG tablet Take 81 mg by mouth daily.    Historical Provider, MD  CYANOCOBALAMIN PO Take by mouth daily.    Historical Provider, MD  doxycycline (VIBRAMYCIN) 100 MG capsule Take 1 capsule (100 mg total) by mouth 2 (two) times daily. 02/09/16   Hayden Rasmussen, NP  simvastatin (ZOCOR) 20 MG tablet Take 20 mg by mouth every evening.    Historical Provider, MD  TURMERIC PO Take by mouth.    Historical Provider, MD   Meds Ordered and Administered this Visit  Medications - No data to display  BP 154/83 mmHg  Pulse 76  Temp(Src) 98.2 F (36.8 C) (Oral)  Resp 12  SpO2 95% No data found.   Physical Exam  Constitutional: He is oriented to person, place, and time. He appears well-developed and well-nourished. No distress.  Eyes: EOM are normal.  Neck: Normal range of motion. Neck supple.  Cardiovascular: Normal rate.   Pulmonary/Chest: Effort normal. No respiratory distress.  Musculoskeletal: He exhibits no edema.  Neurological: He is alert and oriented to person, place, and time. He exhibits normal muscle tone.  Skin: Skin is warm and dry.  Right anterior shoulder with a red, tender raised lesion with underlying induration. 2cm diam. Cutaneous erythema extending 1 to 2 cm around the lesion. No current drainage.No lymphangitis   Psychiatric: He has a normal mood and affect.  Nursing  note and vitals reviewed.   ED Course  .Marland Kitchen.Incision and Drainage Date/Time: 02/09/2016 6:39 PM Performed by: Phineas RealMABE, Sydne Krahl Authorized by: Bradd CanaryKINDL, JAMES D Consent: Verbal consent obtained. Risks and benefits: risks, benefits and alternatives were discussed Consent given by: patient Patient understanding: patient states understanding of the procedure being performed Patient identity confirmed: verbally with patient Type: cyst Body area: upper extremity Location details: right shoulder Anesthesia: local  infiltration Local anesthetic: lidocaine 2% with epinephrine Anesthetic total: 3 ml Scalpel size: 11 Incision type: single straight Incision depth: dermal Complexity: simple Drainage: purulent and  bloody Drainage amount: scant Wound treatment: wound left open Packing material: none Patient tolerance: Patient tolerated the procedure well with no immediate complications Comments: Dressing.   (including critical care time)  Labs Review Labs Reviewed - No data to display  Imaging Review No results found.   Visual Acuity Review  Right Eye Distance:   Left Eye Distance:   Bilateral Distance:    Right Eye Near:   Left Eye Near:    Bilateral Near:         MDM   1. Infected cyst of skin    Keep area clean and dry. Wash with warm soap and water. Warm compresses 3-4 times a day. Take your medication as directed. For worsening or new symptoms or problems may return. Meds ordered this encounter  Medications  . doxycycline (VIBRAMYCIN) 100 MG capsule    Sig: Take 1 capsule (100 mg total) by mouth 2 (two) times daily.    Dispense:  14 capsule    Refill:  0    Order Specific Question:  Supervising Provider    Answer:  Linna HoffKINDL, JAMES D [[id number]      Hayden Rasmussenavid Deno Sida, NP 02/09/16 [phone]

## 2016-02-11 ENCOUNTER — Ambulatory Visit: Payer: PPO | Admitting: Diagnostic Neuroimaging

## 2016-02-17 DIAGNOSIS — G4733 Obstructive sleep apnea (adult) (pediatric): Secondary | ICD-10-CM | POA: Diagnosis not present

## 2016-02-20 DIAGNOSIS — G4733 Obstructive sleep apnea (adult) (pediatric): Secondary | ICD-10-CM | POA: Diagnosis not present

## 2016-02-26 DIAGNOSIS — M329 Systemic lupus erythematosus, unspecified: Secondary | ICD-10-CM | POA: Diagnosis not present

## 2016-02-26 DIAGNOSIS — E789 Disorder of lipoprotein metabolism, unspecified: Secondary | ICD-10-CM | POA: Diagnosis not present

## 2016-02-26 DIAGNOSIS — E118 Type 2 diabetes mellitus with unspecified complications: Secondary | ICD-10-CM | POA: Diagnosis not present

## 2016-02-26 DIAGNOSIS — I10 Essential (primary) hypertension: Secondary | ICD-10-CM | POA: Diagnosis not present

## 2016-03-05 DIAGNOSIS — M549 Dorsalgia, unspecified: Secondary | ICD-10-CM | POA: Diagnosis not present

## 2016-03-05 DIAGNOSIS — R05 Cough: Secondary | ICD-10-CM | POA: Diagnosis not present

## 2016-03-05 DIAGNOSIS — E118 Type 2 diabetes mellitus with unspecified complications: Secondary | ICD-10-CM | POA: Diagnosis not present

## 2016-04-16 DIAGNOSIS — G4733 Obstructive sleep apnea (adult) (pediatric): Secondary | ICD-10-CM | POA: Diagnosis not present

## 2016-06-01 ENCOUNTER — Other Ambulatory Visit: Payer: Self-pay | Admitting: Pharmacist

## 2016-06-01 NOTE — Patient Outreach (Signed)
Outreach call to David Irwin regarding his request for follow up from the [facility]. Left a HIPAA compliant message on the patient's voicemail.  Duanne MoronElisabeth Kiyaan Haq, PharmD Clinical Pharmacist [facility] [phone]

## 2016-06-19 ENCOUNTER — Telehealth: Payer: Self-pay | Admitting: Diagnostic Neuroimaging

## 2016-06-19 NOTE — Telephone Encounter (Signed)
Wife called to request referral to ENT, is aware [name] and nurse Pincus SanesMary C out of the office until Monday.

## 2016-06-22 NOTE — Telephone Encounter (Signed)
Per Dr Marjory LiesPenumalli, spoke with wife and advised her that smell loss if related to dementia typically starts years in advance to onset of dementia. Advised that if this is a new problem for patient, to call his PCP for referral to evaluate. She verbalized understanding, appreciation for call back.

## 2016-06-22 NOTE — Telephone Encounter (Signed)
Spoke with wife and advised that Dr Marjory LiesPenumalli sees patient for his memory loss, OSA, depression. Advised she call his PCP for ENT referral. She stated she is probably going to change his PCP. She then stated she has been "doing some reading", and her husband has loss of smell, and that is a symptom of dementia. She stated "that is why I wanted Dr Marjory LiesPenumalli to make the referral". Informed her this RN will discuss with dr and call her back with his reply. She verbalized understanding, appreciation.

## 2016-06-22 NOTE — Telephone Encounter (Signed)
Ask PCP for ENT referral. Smell loss could be related to dementia, but usually starts years in advance, and there is no specific treatment for this. -VRP

## 2016-06-30 DIAGNOSIS — I10 Essential (primary) hypertension: Secondary | ICD-10-CM | POA: Diagnosis not present

## 2016-06-30 DIAGNOSIS — E118 Type 2 diabetes mellitus with unspecified complications: Secondary | ICD-10-CM | POA: Diagnosis not present

## 2016-07-07 DIAGNOSIS — E118 Type 2 diabetes mellitus with unspecified complications: Secondary | ICD-10-CM | POA: Diagnosis not present

## 2016-07-07 DIAGNOSIS — I10 Essential (primary) hypertension: Secondary | ICD-10-CM | POA: Diagnosis not present

## 2016-07-07 DIAGNOSIS — Z23 Encounter for immunization: Secondary | ICD-10-CM | POA: Diagnosis not present

## 2016-07-07 DIAGNOSIS — R430 Anosmia: Secondary | ICD-10-CM | POA: Diagnosis not present

## 2016-07-07 DIAGNOSIS — M25551 Pain in right hip: Secondary | ICD-10-CM | POA: Diagnosis not present

## 2016-07-07 DIAGNOSIS — M25559 Pain in unspecified hip: Secondary | ICD-10-CM | POA: Diagnosis not present

## 2016-07-07 DIAGNOSIS — N390 Urinary tract infection, site not specified: Secondary | ICD-10-CM | POA: Diagnosis not present

## 2016-07-07 DIAGNOSIS — R471 Dysarthria and anarthria: Secondary | ICD-10-CM | POA: Diagnosis not present

## 2016-07-08 DIAGNOSIS — M5136 Other intervertebral disc degeneration, lumbar region: Secondary | ICD-10-CM | POA: Diagnosis not present

## 2016-07-08 DIAGNOSIS — M150 Primary generalized (osteo)arthritis: Secondary | ICD-10-CM | POA: Diagnosis not present

## 2016-07-08 DIAGNOSIS — M329 Systemic lupus erythematosus, unspecified: Secondary | ICD-10-CM | POA: Diagnosis not present

## 2016-09-10 DIAGNOSIS — I10 Essential (primary) hypertension: Secondary | ICD-10-CM | POA: Diagnosis not present

## 2016-09-10 DIAGNOSIS — E118 Type 2 diabetes mellitus with unspecified complications: Secondary | ICD-10-CM | POA: Diagnosis not present

## 2016-09-17 DIAGNOSIS — G4730 Sleep apnea, unspecified: Secondary | ICD-10-CM | POA: Diagnosis not present

## 2016-09-17 DIAGNOSIS — I10 Essential (primary) hypertension: Secondary | ICD-10-CM | POA: Diagnosis not present

## 2016-09-17 DIAGNOSIS — E118 Type 2 diabetes mellitus with unspecified complications: Secondary | ICD-10-CM | POA: Diagnosis not present

## 2016-09-17 DIAGNOSIS — Z23 Encounter for immunization: Secondary | ICD-10-CM | POA: Diagnosis not present

## 2016-09-21 DIAGNOSIS — G4733 Obstructive sleep apnea (adult) (pediatric): Secondary | ICD-10-CM | POA: Diagnosis not present

## 2016-11-04 DIAGNOSIS — G4733 Obstructive sleep apnea (adult) (pediatric): Secondary | ICD-10-CM | POA: Diagnosis not present

## 2016-12-21 ENCOUNTER — Telehealth: Payer: Self-pay | Admitting: *Deleted

## 2016-12-21 NOTE — Telephone Encounter (Signed)
Spoke with patient's wife and informed her Dr Marjory LiesPenumalli will bo unavailable 4/27 for her husband's follow up. Wife can only bring patient on Thurs or Friday. Rescheduled for 02/05/17 at wife's request.  Advised to arrive 20 minutes early. She verbalized understanding.

## 2016-12-23 DIAGNOSIS — L219 Seborrheic dermatitis, unspecified: Secondary | ICD-10-CM | POA: Diagnosis not present

## 2016-12-23 DIAGNOSIS — L930 Discoid lupus erythematosus: Secondary | ICD-10-CM | POA: Diagnosis not present

## 2016-12-23 DIAGNOSIS — R202 Paresthesia of skin: Secondary | ICD-10-CM | POA: Diagnosis not present

## 2016-12-30 DIAGNOSIS — G4733 Obstructive sleep apnea (adult) (pediatric): Secondary | ICD-10-CM | POA: Diagnosis not present

## 2017-01-18 DIAGNOSIS — M150 Primary generalized (osteo)arthritis: Secondary | ICD-10-CM | POA: Diagnosis not present

## 2017-01-18 DIAGNOSIS — M5136 Other intervertebral disc degeneration, lumbar region: Secondary | ICD-10-CM | POA: Diagnosis not present

## 2017-01-18 DIAGNOSIS — R430 Anosmia: Secondary | ICD-10-CM | POA: Diagnosis not present

## 2017-01-18 DIAGNOSIS — M329 Systemic lupus erythematosus, unspecified: Secondary | ICD-10-CM | POA: Diagnosis not present

## 2017-01-18 DIAGNOSIS — Z6835 Body mass index (BMI) 35.0-35.9, adult: Secondary | ICD-10-CM | POA: Diagnosis not present

## 2017-01-29 ENCOUNTER — Ambulatory Visit: Payer: PPO | Admitting: Diagnostic Neuroimaging

## 2017-02-05 ENCOUNTER — Ambulatory Visit: Payer: Self-pay | Admitting: Diagnostic Neuroimaging

## 2017-02-05 ENCOUNTER — Encounter: Payer: Self-pay | Admitting: Diagnostic Neuroimaging

## 2017-02-05 ENCOUNTER — Ambulatory Visit (INDEPENDENT_AMBULATORY_CARE_PROVIDER_SITE_OTHER): Payer: PPO | Admitting: Diagnostic Neuroimaging

## 2017-02-05 VITALS — BP 141/74 | HR 62

## 2017-02-05 DIAGNOSIS — G4733 Obstructive sleep apnea (adult) (pediatric): Secondary | ICD-10-CM

## 2017-02-05 DIAGNOSIS — F329 Major depressive disorder, single episode, unspecified: Secondary | ICD-10-CM | POA: Diagnosis not present

## 2017-02-05 DIAGNOSIS — R413 Other amnesia: Secondary | ICD-10-CM | POA: Diagnosis not present

## 2017-02-05 DIAGNOSIS — Z9989 Dependence on other enabling machines and devices: Secondary | ICD-10-CM | POA: Diagnosis not present

## 2017-02-05 DIAGNOSIS — F32A Depression, unspecified: Secondary | ICD-10-CM

## 2017-02-05 NOTE — Progress Notes (Signed)
PATIENT: David Irwin DOB: 1946/08/16  REASON FOR VISIT: routine follow up for memory HISTORY FROM: patient, wife  Chief Complaint  Patient presents with  . Memory Loss    rm 7, wife- [name], MMSE 22  . Follow-up    one year     HISTORY OF PRESENT ILLNESS:  UPDATE 02/05/17: Since last visit, memory loss issues continue. Depression is mild and continues. Having some low back pain issues. Still trying to be active in the yard.   UPDATE 01/29/16: Since last visit, doing well, memory loss, but still with abnl spells (usually from waking up state) --> 3 episodes since last visit. Now improved with tinkering IT sales professional, outdoor activities).   UPDATE 08/16/15: Since last visit, memory loss is stable / slightly improved especially since restarting CPAP.  Wife also noted deep depression   UPDATE 05/17/15: Since last visit, memory loss continues. Daydreaming staring spells are less often. Short term memory is poor. Long term memory stable. Gait stable.   UPDATE 12/10/14: Since last visit, doing about the same. 2 weeks ago patient had episode of staring spell 2, lasting less than a minute, when patient's wife was talking to him. Wife noted that he seemed to be acting differently. He was closing his eyes and shaking his head. She asked him what was wrong and he did not respond. She insisted on him trying to explain what was going on. He moved to stretch his muscles, rolled his neck, and finally was able to say some words. However his verbal communication was not related to what his wife was asking about. He seemed to be talking about tangential topics. This lasted just for less than a minute or so. The day before patient had been trying a new pain medication, but he does not recall the name of it. Overall general memory loss has slightly progressed.  UPDATE 09/10/14: Since last visit, was doing well, except 1 more brief staring spell event few nights ago. Patient was able to respond verbally when wife  called to him. Spell lasted < . No shaking.   UPDATE 08/01/14 (VRP): Since last visit, was stable, except has had intermittent spells according to wife: staring, inability to speak, jittery movements of head and hands, lasting 1 minute; no tongue biting or incontinence. No post-ictal confusion. No warning. Has had spells 1 every 3-4 months x 1 year, and then 2 spells in last 2 weeks. No triggers  UPDATE 05/04/14 (LL): Since last visit, patient has been well, but recognizes he has more difficulty remembering places he had been in the last year or two. He does not remember coming to this office one year ago. He has had no decline in activities of daily living. His blood sugars are well controlled, hemoglobin A1c is 6.0. He does not get regular exercise.  He has sleep apnea and has a CPAP machine and tries to use it but states that the full mask is uncomfortable. MMSE 24/30, AFT 12 and CDT 4/4.  UPDATE 05/02/13 (LL): Patient and wife come to office for 6 month follow up for memory testing. Both feel that memory has been stable, no more instances of getting lost while driving. Patient feels like he has not gotten any worse, he has trouble remembering things that he knows sometimes, but he states it usually comes to him with time. Just not on "his" time.   PRIOR HPI (10/28/12, VRP): 71 year old right-handed male with history of lupus, diabetes, hypercholesterolemia, and hypertension, here for evaluation of  memory loss. Summary 2013, patient got lost while driving twice on [address]. He was briefly turned around but eventually was able to make his way to his destination. He had a third episode in December 2013. Since that time he has had no further events. His wife has noticed some short-term memory problems especially with verbal and auditory memory. One time he forgot his granddaughter's name (she is 24 years old and lives [place]). He has 3 grandchildren total. Patient denies any significant sleep  or mood problems. Patient's wife reports some mild mood swings.    REVIEW OF SYSTEMS: Full 14 system review of systems performed and negative except for as per HPI.     ALLERGIES: Allergies  Allergen Reactions  . Liraglutide Other (See Comments)    hallucinations   . Shellfish Allergy Hives    HOME MEDICATIONS: Outpatient Medications Prior to Visit  Medication Sig Dispense Refill  . aspirin 81 MG tablet Take 81 mg by mouth daily.    . CYANOCOBALAMIN PO Take by mouth daily.    Marland Kitchen doxycycline (VIBRAMYCIN) 100 MG capsule Take 1 capsule (100 mg total) by mouth 2 (two) times daily. 14 capsule 0  . fish oil-omega-3 fatty acids 1000 MG capsule 3 g daily.    . hydroxychloroquine (PLAQUENIL) 200 MG tablet Take by mouth daily.    Marland Kitchen KLOR-CON M20 20 MEQ tablet Take 1 tablet by mouth daily.    . Liraglutide (VICTOZA) 18 MG/3ML SOPN Inject into the skin. Use as directed    . simvastatin (ZOCOR) 20 MG tablet Take 20 mg by mouth every evening.    . tamsulosin (FLOMAX) 0.4 MG CAPS Take 1 capsule by mouth daily.    . TURMERIC PO Take by mouth.    . valsartan-hydrochlorothiazide (DIOVAN-HCT) 160-25 MG per tablet Take 1 tablet by mouth 2 (two) times daily.    . metFORMIN (GLUCOPHAGE-XR) 500 MG 24 hr tablet Take 1 tablet by mouth daily.     No facility-administered medications prior to visit.     PHYSICAL EXAM Vitals:   02/05/17 1041  BP: (!) 141/74  Pulse: 62    There is no height or weight on file to calculate BMI.  MMSE - Mini Mental State Exam 02/05/2017 01/29/2016 08/16/2015 05/17/2015 08/01/2014 05/04/2014  Orientation to time 3 3 3 4 4 5   Orientation to Place 5 5 4 5 5 5   Registration 3 3 3 3 3 3   Attention/ Calculation 2 5 5 1 4 2   Recall 2 1 3 2 2 2   Language- name 2 objects 2 2 2 2 2 2   Language- repeat 0 1 1 1 1 1   Language- follow 3 step command 3 3 3 2 2 2   Language- read & follow direction 1 1 1 1 1 1   Write a sentence 1 1 1 1 1 1   Copy design 0 1 1 0 0 0  Total score 22 26  27 22 25 24     GENERAL EXAM: Patient is in no distress; well developed, nourished and groomed; neck is supple; CALM, SOFT SPOKEN  CARDIOVASCULAR: Regular rate and rhythm, no murmurs, no carotid bruits  NEUROLOGIC: MENTAL STATUS: awake, alert, comprehension intact, naming intact, fund of knowledge appropriate; SLOW RESPONSES, NO FRONTAL RELEASE SIGNS; MASKED FACIES CRANIAL NERVE: PINPOINT PUPILS, pupils equal and reactive to light, visual fields full to confrontation, extraocular muscles intact, no nystagmus, facial sensation symmetric, DECR RIGHT NL FOLD, hearing intact, palate elevates symmetrically, uvula midline, shoulder shrug symmetric, tongue midline.  MOTOR: normal bulk and tone, full strength in the BUE, BLE; MILD BRADYKINESIA IN BUE SENSORY: normal and symmetric to light touch  COORDINATION: finger-nose-finger, fine finger movements normal REFLEXES: deep tendon reflexes present and symmetric GAIT/STATION: narrow based gait; SLOW GAIT; DECR ARM SWING, romberg is negative   DIAGNOSTIC DATA (LABS, IMAGING, TESTING)  CBC No results found for: WBC, RBC, HGB, HCT, PLT, MCV, MCH, MCHC, RDW, LYMPHSABS, MONOABS, EOSABS, BASOSABS  No flowsheet data found.   LABS: B12 791, TSH 2.14   11/08/12 MRI BRAIN - Mild periventricular and subcortical foci of T2 hyperintensities. These findings are non-specific and considerations include autoimmune, inflammatory, post-infectious, microvascular ischemia or migraine associated etiologies. No abnormal enhancing lesions.   08/18/14 MRI brain 1. Small acute-subacute left cerebellar ischemic infarction (4mm) within the postero-lateral left cerebellar hemisphere. 2. Several bilateral cerebellar chronic lacunar infarctions.  3. Mild periventricular, subcortical and juxtacortical chronic small vessel ischemic disease.   08/18/14 MRA head (without) demonstrating: 1. The right vertebral artery is hypoplastic near the skull base and then appears to be  occluded for a short segment, and then has apparent reconstitution 1.7cm proximal to the vertebrobasilar junction. 2. Left vertebral artery is dominant, with normal flow throughout the basilar artery. 3. The anterior circulation is unremarkable.  08/15/14 carotid u/s - normal  08/06/14 TTE  - Left ventricle: The cavity size was normal. Wall thickness was normal. Systolic function was normal. The estimated ejection fraction was in the range of 60% to 65%. Wall motion was normal;there were no regional wall motion abnormalities. Doppler parameters are consistent with abnormal left ventricularrelaxation (grade 1 diastolic dysfunction). - Aortic valve: There was mild regurgitation. - Pulmonary arteries: Systolic pressure was mildly increased. PA peak pressure: 40 mm Hg (S).    ASSESSMENT/PLAN:  71 y.o. male with a past medical history of Lupus and Diabetes mellitus here with mild cognitive impairment with memory loss; has had some intermittent episodes of confusion, staring, jitteriness, but unclear etiology (? daydreaming/dementia behavior).   Memory loss has fluctuated, but slightly better with restarting CPAP for OSA. Also with depression.    Ddx: MCI vs pseudo-dementia of depression  Memory loss  Depression, unspecified depression type  OSA on CPAP    PLAN: I spent 15 minutes of face to face time with patient. Greater than 50% of time was spent in counseling and coordination of care with patient. In summary we discussed:  - continue aspirin, statin, diabetes control - no driving (due to memory and abnl spells) - brain healthy activities reviewed and encouraged (nutrition, physical, mental, social activities)  Return if symptoms worsen or fail to improve, for return to PCP.     Suanne MarkerVIKRAM R. Alexxus Sobh, MD 02/05/2017, 10:57 AM Certified in Neurology, Neurophysiology and Neuroimaging  [facility] [address], [address] [place], [place] [address] [phone]

## 2017-02-10 ENCOUNTER — Ambulatory Visit: Payer: Self-pay | Admitting: Diagnostic Neuroimaging

## 2017-03-19 DIAGNOSIS — E7800 Pure hypercholesterolemia, unspecified: Secondary | ICD-10-CM | POA: Diagnosis not present

## 2017-03-19 DIAGNOSIS — N529 Male erectile dysfunction, unspecified: Secondary | ICD-10-CM | POA: Diagnosis not present

## 2017-03-19 DIAGNOSIS — R351 Nocturia: Secondary | ICD-10-CM | POA: Diagnosis not present

## 2017-03-19 DIAGNOSIS — R011 Cardiac murmur, unspecified: Secondary | ICD-10-CM | POA: Diagnosis not present

## 2017-03-19 DIAGNOSIS — I10 Essential (primary) hypertension: Secondary | ICD-10-CM | POA: Diagnosis not present

## 2017-03-19 DIAGNOSIS — Z125 Encounter for screening for malignant neoplasm of prostate: Secondary | ICD-10-CM | POA: Diagnosis not present

## 2017-03-19 DIAGNOSIS — E1165 Type 2 diabetes mellitus with hyperglycemia: Secondary | ICD-10-CM | POA: Diagnosis not present

## 2017-03-19 DIAGNOSIS — Z7984 Long term (current) use of oral hypoglycemic drugs: Secondary | ICD-10-CM | POA: Diagnosis not present

## 2017-03-23 ENCOUNTER — Telehealth: Payer: Self-pay | Admitting: Cardiovascular Disease

## 2017-03-23 NOTE — Telephone Encounter (Signed)
03/23/17 - [facility] on 03/26/17 @ 3pm with Dr. Allyson SabalBerry. [facility] aib

## 2017-03-26 ENCOUNTER — Ambulatory Visit (INDEPENDENT_AMBULATORY_CARE_PROVIDER_SITE_OTHER): Payer: PPO | Admitting: Cardiovascular Disease

## 2017-03-26 ENCOUNTER — Encounter: Payer: Self-pay | Admitting: Cardiovascular Disease

## 2017-03-26 ENCOUNTER — Encounter (INDEPENDENT_AMBULATORY_CARE_PROVIDER_SITE_OTHER): Payer: Self-pay

## 2017-03-26 VITALS — BP 140/75 | HR 70 | Ht 70.0 in | Wt 225.0 lb

## 2017-03-26 DIAGNOSIS — E785 Hyperlipidemia, unspecified: Secondary | ICD-10-CM | POA: Insufficient documentation

## 2017-03-26 DIAGNOSIS — E7800 Pure hypercholesterolemia, unspecified: Secondary | ICD-10-CM

## 2017-03-26 DIAGNOSIS — R011 Cardiac murmur, unspecified: Secondary | ICD-10-CM | POA: Diagnosis not present

## 2017-03-26 DIAGNOSIS — I10 Essential (primary) hypertension: Secondary | ICD-10-CM

## 2017-03-26 NOTE — Progress Notes (Signed)
03/26/2017 David Irwin   11/05/1945  [id number]  Primary Physician Daisy Florooss, Charles Alan, MD Primary Cardiologist: Runell GessJonathan J Berry MD Roseanne RenoFACP, FACC, FAHA, FSCAI  HPI:  David Irwin is a delightful 71 year old mildly overweight married African-American male father of one living child, grandfather of 3 grandchildren referred by Dr. Tenny Crawoss for cardiovascular evaluation because of an auscultated murmur. He is accompanied by his wife [name] who is a patient of Dr. Landry DykeKelly's. He is a retired Journalist, newspaperauto mechanic. He has never smoked nor does he drink alcohol. There is no family history. He has never Had a heart attack or stroke. Dr. Tenny Crawoss auscultated murmur and sent him here for further evaluation.   Current Outpatient Prescriptions  Medication Sig Dispense Refill  . aspirin 81 MG tablet Take 81 mg by mouth daily.    . CYANOCOBALAMIN PO Take by mouth daily.    . fish oil-omega-3 fatty acids 1000 MG capsule 3 g daily.    Marland Kitchen. glimepiride (AMARYL) 2 MG tablet 2 mg daily.    . hydroxychloroquine (PLAQUENIL) 200 MG tablet Take by mouth daily.    Marland Kitchen. KLOR-CON M20 20 MEQ tablet Take 1 tablet by mouth daily.    . Liraglutide (VICTOZA) 18 MG/3ML SOPN Inject into the skin. Use as directed    . Multiple Vitamin tablet Take 1 tablet by mouth daily.    . simvastatin (ZOCOR) 20 MG tablet Take 20 mg by mouth every evening.    . tamsulosin (FLOMAX) 0.4 MG CAPS Take 1 capsule by mouth daily.    . TURMERIC PO Take by mouth.    . valsartan-hydrochlorothiazide (DIOVAN-HCT) 160-25 MG per tablet Take 1 tablet by mouth daily.      No current facility-administered medications for this visit.     Allergies  Allergen Reactions  . Liraglutide Other (See Comments)    hallucinations   . Shellfish Allergy Hives    Social History   Social History  . Marital status: Married    Spouse name: [name]  . Number of children: 2  . Years of education: 12th   Occupational History  . Retired    Social History Main Topics  . Smoking  status: Never Smoker  . Smokeless tobacco: Never Used  . Alcohol use No  . Drug use: No  . Sexual activity: Not on file   Other Topics Concern  . Not on file   Social History Narrative   Patient lives at home with spouse.   Caffeine Use: 1-2 cups daily     Review of Systems: General: negative for chills, fever, night sweats or weight changes.  Cardiovascular: negative for chest pain, dyspnea on exertion, edema, orthopnea, palpitations, paroxysmal nocturnal dyspnea or shortness of breath Dermatological: negative for rash Respiratory: negative for cough or wheezing Urologic: negative for hematuria Abdominal: negative for nausea, vomiting, diarrhea, bright red blood per rectum, melena, or hematemesis Neurologic: negative for visual changes, syncope, or dizziness All other systems reviewed and are otherwise negative except as noted above.    Blood pressure 140/75, pulse 70, height 5\' 10"  (1.778 m), weight 225 lb (102.1 kg).  General appearance: alert and no distress Neck: no adenopathy, no carotid bruit, no JVD, supple, symmetrical, trachea midline and thyroid not enlarged, symmetric, no tenderness/mass/nodules Lungs: clear to auscultation bilaterally Heart: soft outflow tract murmur consistent with aortic stenosis and/or sclerosis. Extremities: extremities normal, atraumatic, no cyanosis or edema  EKG Sinus rhythm at 70 with nonspecific ST and T-wave changes. I personally reviewed this EKG.  ASSESSMENT AND PLAN:   Cardiac murmur Mr. David Irwin is referred by Dr. Tenny Craw because of an auscultated cardiac murmur. He does have a soft outflow tract murmur which is probably aortic sclerosis. We will check a 2-D echocardiogram.  Hyperlipidemia History of hyperlipidemia on statin therapy followed by his PCP  Essential hypertension History of essential hypertension which measures 140/75. He is on valsartan and hydrochlorothiazide. Continue current meds at current dosing      Runell Gess MD [facility], [facility] 03/26/2017 3:35 PM

## 2017-03-26 NOTE — Assessment & Plan Note (Signed)
Mr. David Irwin is referred by Dr. Tenny Crawoss because of an auscultated cardiac murmur. He does have a soft outflow tract murmur which is probably aortic sclerosis. We will check a 2-D echocardiogram.

## 2017-03-26 NOTE — Assessment & Plan Note (Signed)
History of hyperlipidemia on statin therapy followed by his PCP 

## 2017-03-26 NOTE — Assessment & Plan Note (Signed)
History of essential hypertension which measures 140/75. He is on valsartan and hydrochlorothiazide. Continue current meds at current dosing

## 2017-03-26 NOTE — Patient Instructions (Signed)

## 2017-04-15 ENCOUNTER — Other Ambulatory Visit: Payer: Self-pay

## 2017-04-15 ENCOUNTER — Ambulatory Visit (HOSPITAL_COMMUNITY): Payer: PPO | Attending: Cardiology

## 2017-04-15 DIAGNOSIS — R011 Cardiac murmur, unspecified: Secondary | ICD-10-CM | POA: Insufficient documentation

## 2017-05-13 DIAGNOSIS — M329 Systemic lupus erythematosus, unspecified: Secondary | ICD-10-CM | POA: Diagnosis not present

## 2017-05-13 DIAGNOSIS — E119 Type 2 diabetes mellitus without complications: Secondary | ICD-10-CM | POA: Diagnosis not present

## 2017-05-13 DIAGNOSIS — H353132 Nonexudative age-related macular degeneration, bilateral, intermediate dry stage: Secondary | ICD-10-CM | POA: Diagnosis not present

## 2017-05-13 DIAGNOSIS — H40013 Open angle with borderline findings, low risk, bilateral: Secondary | ICD-10-CM | POA: Diagnosis not present

## 2017-07-20 DIAGNOSIS — M329 Systemic lupus erythematosus, unspecified: Secondary | ICD-10-CM | POA: Diagnosis not present

## 2017-07-20 DIAGNOSIS — Z6834 Body mass index (BMI) 34.0-34.9, adult: Secondary | ICD-10-CM | POA: Diagnosis not present

## 2017-07-20 DIAGNOSIS — E669 Obesity, unspecified: Secondary | ICD-10-CM | POA: Diagnosis not present

## 2017-07-20 DIAGNOSIS — M150 Primary generalized (osteo)arthritis: Secondary | ICD-10-CM | POA: Diagnosis not present

## 2017-07-20 DIAGNOSIS — R430 Anosmia: Secondary | ICD-10-CM | POA: Diagnosis not present

## 2017-07-20 DIAGNOSIS — M5136 Other intervertebral disc degeneration, lumbar region: Secondary | ICD-10-CM | POA: Diagnosis not present

## 2017-09-17 DIAGNOSIS — Z23 Encounter for immunization: Secondary | ICD-10-CM | POA: Diagnosis not present

## 2017-09-17 DIAGNOSIS — R0981 Nasal congestion: Secondary | ICD-10-CM | POA: Diagnosis not present

## 2017-09-17 DIAGNOSIS — E119 Type 2 diabetes mellitus without complications: Secondary | ICD-10-CM | POA: Diagnosis not present

## 2017-09-17 DIAGNOSIS — R350 Frequency of micturition: Secondary | ICD-10-CM | POA: Diagnosis not present

## 2017-09-17 DIAGNOSIS — Z7984 Long term (current) use of oral hypoglycemic drugs: Secondary | ICD-10-CM | POA: Diagnosis not present

## 2017-10-01 ENCOUNTER — Telehealth: Payer: Self-pay | Admitting: Diagnostic Neuroimaging

## 2017-10-01 DIAGNOSIS — R430 Anosmia: Secondary | ICD-10-CM | POA: Diagnosis not present

## 2017-10-01 NOTE — Telephone Encounter (Signed)
Not able to do jury duty. pls write letter. -VRP

## 2017-10-01 NOTE — Telephone Encounter (Signed)
Pts wife calling wanting to know if he should be able to do jury duty. He has gotten the letter. Please call to discuss if it would be wise .

## 2017-10-06 ENCOUNTER — Encounter: Payer: Self-pay | Admitting: *Deleted

## 2017-10-06 NOTE — Telephone Encounter (Signed)
Pt wife has called re: the request for the jury duty letter, she is asking that it be prepared as soon as possible because there is a deadline in which she has to contact the courts of pt not being able to come or he will otherwise be expected to go to serve [employer].  Pt wife is asking to be called as soon as possiblebe

## 2017-10-06 NOTE — Telephone Encounter (Addendum)
Spoke to patient's wife - she needs this letter prior to the end of the week.  The letter has been written and signed by Dr. Terrace ArabiaYan in Dr. Richrd HumblesPenumalli's absence.  The letter has been placed up front and she will pick it up today.

## 2017-11-19 DIAGNOSIS — Z79899 Other long term (current) drug therapy: Secondary | ICD-10-CM | POA: Diagnosis not present

## 2017-11-19 DIAGNOSIS — M329 Systemic lupus erythematosus, unspecified: Secondary | ICD-10-CM | POA: Diagnosis not present

## 2017-11-19 DIAGNOSIS — H26493 Other secondary cataract, bilateral: Secondary | ICD-10-CM | POA: Diagnosis not present

## 2017-11-19 DIAGNOSIS — H40013 Open angle with borderline findings, low risk, bilateral: Secondary | ICD-10-CM | POA: Diagnosis not present

## 2017-12-06 DIAGNOSIS — Z961 Presence of intraocular lens: Secondary | ICD-10-CM | POA: Diagnosis not present

## 2017-12-06 DIAGNOSIS — M329 Systemic lupus erythematosus, unspecified: Secondary | ICD-10-CM | POA: Diagnosis not present

## 2017-12-06 DIAGNOSIS — H353132 Nonexudative age-related macular degeneration, bilateral, intermediate dry stage: Secondary | ICD-10-CM | POA: Diagnosis not present

## 2017-12-06 DIAGNOSIS — H26493 Other secondary cataract, bilateral: Secondary | ICD-10-CM | POA: Diagnosis not present

## 2017-12-06 DIAGNOSIS — H26491 Other secondary cataract, right eye: Secondary | ICD-10-CM | POA: Diagnosis not present

## 2017-12-16 ENCOUNTER — Encounter: Payer: Self-pay | Admitting: Nurse Practitioner

## 2017-12-16 ENCOUNTER — Ambulatory Visit (INDEPENDENT_AMBULATORY_CARE_PROVIDER_SITE_OTHER): Payer: PPO | Admitting: Nurse Practitioner

## 2017-12-16 VITALS — BP 113/61 | HR 64 | Wt 241.0 lb

## 2017-12-16 DIAGNOSIS — E7800 Pure hypercholesterolemia, unspecified: Secondary | ICD-10-CM | POA: Diagnosis not present

## 2017-12-16 DIAGNOSIS — F329 Major depressive disorder, single episode, unspecified: Secondary | ICD-10-CM

## 2017-12-16 DIAGNOSIS — F32A Depression, unspecified: Secondary | ICD-10-CM

## 2017-12-16 DIAGNOSIS — R413 Other amnesia: Secondary | ICD-10-CM | POA: Diagnosis not present

## 2017-12-16 DIAGNOSIS — I10 Essential (primary) hypertension: Secondary | ICD-10-CM

## 2017-12-16 MED ORDER — SERTRALINE HCL 25 MG PO TABS: 25.0000 mg | ORAL_TABLET | Freq: Every day | ORAL | 6 refills | Status: DC

## 2017-12-16 NOTE — Patient Instructions (Addendum)
OSA on CPAP  continue continue aspirin, statin, diabetes control Depression scale elevated, will begin Zoloft low dose  Important to exercise every day 30 minutes of walking - brain healthy activities reviewed and encouraged (nutrition, physical, mental, social activities) Continue follow-up with primary care

## 2017-12-16 NOTE — Progress Notes (Signed)
[facility]  PATIENT: David Irwin DOB: 09/07/1946   REASON FOR VISIT: Follow-up for memory loss depressive affect HISTORY FROM: Patient and wife David Irwin    HISTORY OF PRESENT ILLNESS:UPDATE 3/14/19CM Mr. David Irwin, 72 year old male returns for follow-up with a history of memory loss and depression.  He also has a history of obstructive sleep apnea and uses CPAP every night.No recent compliance data.  Obstructive sleep apnea not followed by our office.  He has been less active over the winter months due to the weather.  Memory score  stable from when last seen 10 months ago patient is also recent hemoglobin A1c 6.7.  Blood pressure in the office today 113/61.  No falls since last seen.  He returns for reevaluation.   UPDATE 02/05/17: Since last visit, memory loss issues continue. Depression is mild and continues. Having some low back pain issues. Still trying to be active in the yard.   UPDATE 01/29/16: Since last visit, doing well, memory loss, but still with abnl spells (usually from waking up state) --> 3 episodes since last visit. 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   UPDATE 08/16/15: Since last visit, memory loss is stable / slightly improved especially since restarting CPAP.  Wife also noted deep depression   UPDATE 05/17/15: Since last visit, memory loss continues. Daydreaming staring spells are less often. Short term memory is poor. Long term memory stable. Gait stable.   UPDATE 12/10/14: Since last visit, doing about the same. 2 weeks ago patient had episode of staring spell 2, lasting less than a minute, when patient's wife was talking to him. Wife noted that he seemed to be acting differently. He was closing his eyes and shaking his head. She asked him what was wrong and he did not respond. She insisted on him trying to explain what was going on. He moved to stretch his muscles, rolled his neck, and finally was able to say some words.  However his verbal communication was not related to what his wife was asking about. He seemed to be talking about tangential topics. This lasted just for less than a minute or so. The day before patient had been trying a new pain medication, but he does not recall the name of it. Overall general memory loss has slightly progressed.  UPDATE 09/10/14: Since last visit, was doing well, except 1 more brief staring spell event few nights ago. Patient was able to respond verbally when wife called to him. Spell lasted < 1min. No shaking.   UPDATE 08/01/14 (VRP): Since last visit, was stable, except has had intermittent spells according to wife: staring, inability to speak, jittery movements of head and hands, lasting 1 minute; no tongue biting or incontinence. No post-ictal confusion. No warning. Has had spells 1 every 3-4 months x 1 year, and then 2 spells in last 2 weeks. No triggers  UPDATE 05/04/14 (LL): Since last visit, patient has been well, but recognizes he has more difficulty remembering places he had been in the last year or two. He does not remember coming to this office one year ago. He has had no decline in activities of daily living. His blood sugars are well controlled, hemoglobin A1c is 6.0. He does not get regular exercise.  He has sleep apnea and has a CPAP machine and tries to use it but states that the full mask is uncomfortable. MMSE 24/30, AFT 12 and CDT 4/4.  UPDATE 05/02/13 (LL): Patient and wife come to office for 6 month  follow up for memory testing. Both feel that memory has been stable, no more instances of getting lost while driving. Patient feels like he has not gotten any worse, he has trouble remembering things that he knows sometimes, but he states it usually comes to him with time. Just not on "his" time.   PRIOR HPI (10/28/12, VRP): 72 year old right-handed male with history of lupus, diabetes, hypercholesterolemia, and hypertension, here for evaluation of memory loss.  Summary 2013, patient got lost while driving twice on [address]. He was briefly turned around but eventually was able to make his way to his destination. He had a third episode in December 2013. Since that time he has had no further events. His wife has noticed some short-term memory problems especially with verbal and auditory memory. One time he forgot his granddaughter's name (she is 38 years old and lives [place]). He has 3 grandchildren total. Patient denies any significant sleep or mood problems. Patient's wife reports some mild mood swings.    REVIEW OF SYSTEMS: Full 14 system review of systems performed and notable only for those listed, all others are neg:  Constitutional: neg  Cardiovascular: neg Ear/Nose/Throat: neg  Skin: neg Eyes: neg Respiratory: neg Gastroitestinal: neg  Hematology/Lymphatic: neg  Endocrine: neg Musculoskeletal:neg Allergy/Immunology: neg Neurological: Memory loss Psychiatric: Depression Sleep : neg   ALLERGIES: Allergies  Allergen Reactions  . Liraglutide Other (See Comments)    hallucinations   . Shellfish Allergy Hives    HOME MEDICATIONS: Outpatient Medications Prior to Visit  Medication Sig Dispense Refill  . aspirin 81 MG tablet Take 81 mg by mouth daily.    . CYANOCOBALAMIN PO Take by mouth daily.    . fish oil-omega-3 fatty acids 1000 MG capsule 3 g daily.    Marland Kitchen glimepiride (AMARYL) 2 MG tablet 2 mg daily.    . hydroxychloroquine (PLAQUENIL) 200 MG tablet Take by mouth daily.    Marland Kitchen KLOR-CON M20 20 MEQ tablet Take 1 tablet by mouth daily.    . Multiple Vitamin tablet Take 1 tablet by mouth daily.    . simvastatin (ZOCOR) 20 MG tablet Take 20 mg by mouth every evening.    . tamsulosin (FLOMAX) 0.4 MG CAPS Take 1 capsule by mouth daily.    . TURMERIC PO Take by mouth.    . valsartan-hydrochlorothiazide (DIOVAN-HCT) 160-25 MG per tablet Take 1 tablet by mouth daily.     . Liraglutide (VICTOZA) 18 MG/3ML SOPN Inject into the skin.  Use as directed     No facility-administered medications prior to visit.     PAST MEDICAL HISTORY: Past Medical History:  Diagnosis Date  . Diabetes mellitus without complication (HCC)   . Hypertension   . Lupus   . Memory loss   . Murmur, cardiac   . Nocturia     PAST SURGICAL HISTORY: Past Surgical History:  Procedure Laterality Date  . APPENDECTOMY    . EYE SURGERY      FAMILY HISTORY: Family History  Problem Relation Age of Onset  . Kidney disease Mother   . Dementia Father     SOCIAL HISTORY: Social History   Socioeconomic History  . Marital status: Married    Spouse name: David Irwin  . Number of children: 2  . Years of education: 12th  . Highest education level: Not on file  Social Needs  . Financial resource strain: Not on file  . Food insecurity - worry: Not on file  . Food insecurity - inability: Not on file  .  Transportation needs - medical: Not on file  . Transportation needs - non-medical: Not on file  Occupational History  . Occupation: Retired  Tobacco Use  . Smoking status: Never Smoker  . Smokeless tobacco: Never Used  Substance and Sexual Activity  . Alcohol use: No  . Drug use: No  . Sexual activity: Not on file  Other Topics Concern  . Not on file  Social History Narrative   Patient lives at home with spouse.   Caffeine Use: 1-2 cups daily   12th grade     PHYSICAL EXAM  Vitals:   12/16/17 0806  BP: 113/61  Pulse: 64  Weight: 241 lb (109.3 kg)   Body mass index is 34.58 kg/m.  Generalized: Well developed, obese male in no acute distress  Head: normocephalic and atraumatic,. Oropharynx benign  Neck: Supple, no carotid bruits  Cardiac: Regular rate rhythm, soft murmur  Musculoskeletal: No deformity   Neurological examination   Mentation: Alert slow response to questions GDS 8  MMSE - Mini Mental State Exam 12/16/2017 02/05/2017 01/29/2016  Orientation to time 4 3 3   Orientation to Place 4 5 5   Registration 3 3 3     Attention/ Calculation 1 2 5   Recall 2 2 1   Language- name 2 objects 2 2 2   Language- repeat 0 0 1  Language- follow 3 step command 3 3 3   Language- read & follow direction 1 1 1   Write a sentence 1 1 1   Copy design 0 0 1  Total score 21 22 26     Follows all commands speech and language fluent.   Cranial nerve II-XII: Pupils were equal round reactive to light extraocular movements were full, visual field were full on confrontational test. Facial sensation and strength were normal. hearing was intact to finger rubbing bilaterally. Uvula tongue midline. head turning and shoulder shrug were normal and symmetric.Tongue protrusion into cheek strength was normal. Motor: normal bulk and tone, full strength in the BUE, BLE, mild bradykinesia in the upper extremities  Sensory: normal and symmetric to light touch, pinprick, and  Vibration, in the upper and lower extremities Coordination: finger-nose-finger, heel-to-shin bilaterally, no dysmetria, no tremor Reflexes: Symmetric upper and lower  plantar responses were flexor bilaterally. Gait and Station: Rising up from seated position without assistance, normal stance, slow gait mild decreased arm swing, no difficulty with turns no assistive device Romberg negative  DIAGNOSTIC DATA (LABS, IMAGING, TESTING) - I reviewed patient records, labs, notes, testing and imaging myself where available.   ASSESSMENT AND PLAN 72 y.o. male with a past medical history of Lupus and Diabetes mellitus here with mild cognitive impairment with memory loss; (? daydreaming/dementia behavior).  MMSE stable from previous.  Wife reports he is compliant with CPAP but it is an old machine and she is wondering if he needs to be reevaluated.  Depression score is increased today at 8 suggestive of depression unspecified type.   PLAN: OSA on CPAP  continue, if you want evaluation by sleep doctor here will need to get a referral Continue  aspirin, statin, diabetes control for  history of subacute acute left cerebellar ischemic infarction in 2015 Depression scale elevated, will begin Zoloft Important to exercise every day 30 minutes of walking - brain healthy activities reviewed and encouraged (nutrition, physical, mental, social activities) Continue follow-up with primary care For follow-up here in 6 months Nilda Riggs, [facility], [facility], APRN  [facility] [address], [address] [place], [place] [address] [phone]

## 2017-12-16 NOTE — Progress Notes (Signed)
I reviewed note and agree with plan.   Suanne MarkerVIKRAM R. Tamyra Fojtik, MD 12/16/2017, 5:03 PM Certified in Neurology, Neurophysiology and Neuroimaging  [facility] [address], [address] [place], [place] [id number] [phone]

## 2017-12-21 DIAGNOSIS — H26492 Other secondary cataract, left eye: Secondary | ICD-10-CM | POA: Diagnosis not present

## 2017-12-24 DIAGNOSIS — G4733 Obstructive sleep apnea (adult) (pediatric): Secondary | ICD-10-CM | POA: Diagnosis not present

## 2017-12-24 DIAGNOSIS — I10 Essential (primary) hypertension: Secondary | ICD-10-CM | POA: Diagnosis not present

## 2018-01-20 DIAGNOSIS — R430 Anosmia: Secondary | ICD-10-CM | POA: Diagnosis not present

## 2018-01-20 DIAGNOSIS — M329 Systemic lupus erythematosus, unspecified: Secondary | ICD-10-CM | POA: Diagnosis not present

## 2018-01-20 DIAGNOSIS — E669 Obesity, unspecified: Secondary | ICD-10-CM | POA: Diagnosis not present

## 2018-01-20 DIAGNOSIS — Z6835 Body mass index (BMI) 35.0-35.9, adult: Secondary | ICD-10-CM | POA: Diagnosis not present

## 2018-01-20 DIAGNOSIS — M150 Primary generalized (osteo)arthritis: Secondary | ICD-10-CM | POA: Diagnosis not present

## 2018-01-20 DIAGNOSIS — M5136 Other intervertebral disc degeneration, lumbar region: Secondary | ICD-10-CM | POA: Diagnosis not present

## 2018-01-26 DIAGNOSIS — F329 Major depressive disorder, single episode, unspecified: Secondary | ICD-10-CM | POA: Diagnosis not present

## 2018-02-02 DIAGNOSIS — G4733 Obstructive sleep apnea (adult) (pediatric): Secondary | ICD-10-CM | POA: Diagnosis not present

## 2018-02-09 ENCOUNTER — Other Ambulatory Visit: Payer: Self-pay | Admitting: Nurse Practitioner

## 2018-03-07 DIAGNOSIS — F329 Major depressive disorder, single episode, unspecified: Secondary | ICD-10-CM | POA: Diagnosis not present

## 2018-03-07 DIAGNOSIS — R252 Cramp and spasm: Secondary | ICD-10-CM | POA: Diagnosis not present

## 2018-03-17 DIAGNOSIS — G4733 Obstructive sleep apnea (adult) (pediatric): Secondary | ICD-10-CM | POA: Diagnosis not present

## 2018-04-16 DIAGNOSIS — G4733 Obstructive sleep apnea (adult) (pediatric): Secondary | ICD-10-CM | POA: Diagnosis not present

## 2018-05-02 DIAGNOSIS — G4733 Obstructive sleep apnea (adult) (pediatric): Secondary | ICD-10-CM | POA: Diagnosis not present

## 2018-05-17 DIAGNOSIS — G4733 Obstructive sleep apnea (adult) (pediatric): Secondary | ICD-10-CM | POA: Diagnosis not present

## 2018-06-01 DIAGNOSIS — G4733 Obstructive sleep apnea (adult) (pediatric): Secondary | ICD-10-CM | POA: Diagnosis not present

## 2018-06-15 NOTE — Progress Notes (Deleted)
[facility]  PATIENT: David BoozeGeorge Irwin DOB: 09/07/1946   REASON FOR VISIT: Follow-up for memory loss depressive affect HISTORY FROM: Patient and wife David Irwin    HISTORY OF PRESENT ILLNESS:UPDATE 3/14/19CM David Irwin, 72 year old male returns for follow-up with a history of memory loss and depression.  He also has a history of obstructive sleep apnea and uses CPAP every night.No recent compliance data.  Obstructive sleep apnea not followed by our office.  He has been less active over the winter months due to the weather.  Memory score  stable from when last seen 10 months ago patient is also recent hemoglobin A1c 6.7.  Blood pressure in the office today 113/61.  No falls since last seen.  He returns for reevaluation.   UPDATE 02/05/17: Since last visit, memory loss issues continue. Depression is mild and continues. Having some low back pain issues. Still trying to be active in the yard.   UPDATE 01/29/16: Since last visit, doing well, memory loss, but still with abnl spells (usually from waking up state) --> 3 episodes since last visit. 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   UPDATE 08/16/15: Since last visit, memory loss is stable / slightly improved especially since restarting CPAP.  Wife also noted deep depression   UPDATE 05/17/15: Since last visit, memory loss continues. Daydreaming staring spells are less often. Short term memory is poor. Long term memory stable. Gait stable.   UPDATE 12/10/14: Since last visit, doing about the same. 2 weeks ago patient had episode of staring spell 2, lasting less than a minute, when patient's wife was talking to him. Wife noted that he seemed to be acting differently. He was closing his eyes and shaking his head. She asked him what was wrong and he did not respond. She insisted on him trying to explain what was going on. He moved to stretch his muscles, rolled his neck, and finally was able to say some words.  However his verbal communication was not related to what his wife was asking about. He seemed to be talking about tangential topics. This lasted just for less than a minute or so. The day before patient had been trying a new pain medication, but he does not recall the name of it. Overall general memory loss has slightly progressed.  UPDATE 09/10/14: Since last visit, was doing well, except 1 more brief staring spell event few nights ago. Patient was able to respond verbally when wife called to him. Spell lasted < 1min. No shaking.   UPDATE 08/01/14 (VRP): Since last visit, was stable, except has had intermittent spells according to wife: staring, inability to speak, jittery movements of head and hands, lasting 1 minute; no tongue biting or incontinence. No post-ictal confusion. No warning. Has had spells 1 every 3-4 months x 1 year, and then 2 spells in last 2 weeks. No triggers  UPDATE 05/04/14 (LL): Since last visit, patient has been well, but recognizes he has more difficulty remembering places he had been in the last year or two. He does not remember coming to this office one year ago. He has had no decline in activities of daily living. His blood sugars are well controlled, hemoglobin A1c is 6.0. He does not get regular exercise.  He has sleep apnea and has a CPAP machine and tries to use it but states that the full mask is uncomfortable. MMSE 24/30, AFT 12 and CDT 4/4.  UPDATE 05/02/13 (LL): Patient and wife come to office for 6 month  follow up for memory testing. Both feel that memory has been stable, no more instances of getting lost while driving. Patient feels like he has not gotten any worse, he has trouble remembering things that he knows sometimes, but he states it usually comes to him with time. Just not on "his" time.   PRIOR HPI (10/28/12, VRP): 72 year old right-handed male with history of lupus, diabetes, hypercholesterolemia, and hypertension, here for evaluation of memory loss.  Summary 2013, patient got lost while driving twice [place] [address]. He was briefly turned around but eventually was able to make his way to his destination. He had a third episode in December 2013. Since that time he has had no further events. His wife has noticed some short-term memory problems especially with verbal and auditory memory. One time he forgot his granddaughter's name (she is 68 years old and lives [place]). He has 3 grandchildren total. Patient denies any significant sleep or mood problems. Patient's wife reports some mild mood swings.    REVIEW OF SYSTEMS: Full 14 system review of systems performed and notable only for those listed, all others are neg:  Constitutional: neg  Cardiovascular: neg Ear/Nose/Throat: neg  Skin: neg Eyes: neg Respiratory: neg Gastroitestinal: neg  Hematology/Lymphatic: neg  Endocrine: neg Musculoskeletal:neg Allergy/Immunology: neg Neurological: Memory loss Psychiatric: Depression Sleep : neg   ALLERGIES: Allergies  Allergen Reactions  . Liraglutide Other (See Comments)    hallucinations   . Shellfish Allergy Hives    HOME MEDICATIONS: Outpatient Medications Prior to Visit  Medication Sig Dispense Refill  . aspirin 81 MG tablet Take 81 mg by mouth daily.    . CYANOCOBALAMIN PO Take by mouth daily.    . fish oil-omega-3 fatty acids 1000 MG capsule 3 g daily.    Marland Kitchen glimepiride (AMARYL) 2 MG tablet 2 mg daily.    . hydroxychloroquine (PLAQUENIL) 200 MG tablet Take by mouth daily.    Marland Kitchen KLOR-CON M20 20 MEQ tablet Take 1 tablet by mouth daily.    . Multiple Vitamin tablet Take 1 tablet by mouth daily.    . sertraline (ZOLOFT) 25 MG tablet TAKE 1 TABLET BY MOUTH EVERY DAY 90 tablet 1  . simvastatin (ZOCOR) 20 MG tablet Take 20 mg by mouth every evening.    . tamsulosin (FLOMAX) 0.4 MG CAPS Take 1 capsule by mouth daily.    . TURMERIC PO Take by mouth.    . valsartan-hydrochlorothiazide (DIOVAN-HCT) 160-25 MG per tablet Take 1 tablet  by mouth daily.      No facility-administered medications prior to visit.     PAST MEDICAL HISTORY: Past Medical History:  Diagnosis Date  . Diabetes mellitus without complication (HCC)   . Hypertension   . Lupus   . Memory loss   . Murmur, cardiac   . Nocturia     PAST SURGICAL HISTORY: Past Surgical History:  Procedure Laterality Date  . APPENDECTOMY    . EYE SURGERY      FAMILY HISTORY: Family History  Problem Relation Age of Onset  . Kidney disease Mother   . Dementia Father     SOCIAL HISTORY: Social History   Socioeconomic History  . Marital status: Married    Spouse name: David Irwin  . Number of children: 2  . Years of education: 12th  . Highest education level: Not on file  Occupational History  . Occupation: Retired  Engineer, production  . Financial resource strain: Not on file  . Food insecurity:    Worry: Not on  file    Inability: Not on file  . Transportation needs:    Medical: Not on file    Non-medical: Not on file  Tobacco Use  . Smoking status: Never Smoker  . Smokeless tobacco: Never Used  Substance and Sexual Activity  . Alcohol use: No  . Drug use: No  . Sexual activity: Not on file  Lifestyle  . Physical activity:    Days per week: Not on file    Minutes per session: Not on file  . Stress: Not on file  Relationships  . Social connections:    Talks on phone: Not on file    Gets together: Not on file    Attends religious service: Not on file    Active member of club or organization: Not on file    Attends meetings of clubs or organizations: Not on file    Relationship status: Not on file  . Intimate partner violence:    Fear of current or ex partner: Not on file    Emotionally abused: Not on file    Physically abused: Not on file    Forced sexual activity: Not on file  Other Topics Concern  . Not on file  Social History Narrative   Patient lives at home with spouse.   Caffeine Use: 1-2 cups daily   12th grade     PHYSICAL  EXAM  There were no vitals filed for this visit. There is no height or weight on file to calculate BMI.  Generalized: Well developed, obese male in no acute distress  Head: normocephalic and atraumatic,. Oropharynx benign  Neck: Supple, no carotid bruits  Cardiac: Regular rate rhythm, soft murmur  Musculoskeletal: No deformity   Neurological examination   Mentation: Alert slow response to questions GDS 8  MMSE - Mini Mental State Exam 12/16/2017 02/05/2017 01/29/2016  Orientation to time 4 3 3   Orientation to Place 4 5 5   Registration 3 3 3   Attention/ Calculation 1 2 5   Recall 2 2 1   Language- name 2 objects 2 2 2   Language- repeat 0 0 1  Language- follow 3 step command 3 3 3   Language- read & follow direction 1 1 1   Write a sentence 1 1 1   Copy design 0 0 1  Total score 21 22 26     Follows all commands speech and language fluent.   Cranial nerve II-XII: Pupils were equal round reactive to light extraocular movements were full, visual field were full on confrontational test. Facial sensation and strength were normal. hearing was intact to finger rubbing bilaterally. Uvula tongue midline. head turning and shoulder shrug were normal and symmetric.Tongue protrusion into cheek strength was normal. Motor: normal bulk and tone, full strength in the BUE, BLE, mild bradykinesia in the upper extremities  Sensory: normal and symmetric to light touch, pinprick, and  Vibration, in the upper and lower extremities Coordination: finger-nose-finger, heel-to-shin bilaterally, no dysmetria, no tremor Reflexes: Symmetric upper and lower  plantar responses were flexor bilaterally. Gait and Station: Rising up from seated position without assistance, normal stance, slow gait mild decreased arm swing, no difficulty with turns no assistive device Romberg negative  DIAGNOSTIC DATA (LABS, IMAGING, TESTING) - I reviewed patient records, labs, notes, testing and imaging myself where available.   ASSESSMENT  AND PLAN 72 y.o. male with a past medical history of Lupus and Diabetes mellitus here with mild cognitive impairment with memory loss; (? daydreaming/dementia behavior).  MMSE stable from previous.  Wife reports he  is compliant with CPAP but it is an old machine and she is wondering if he needs to be reevaluated.  Depression score is increased today at 8 suggestive of depression unspecified type.   PLAN: OSA on CPAP  continue, if you want evaluation by sleep doctor here will need to get a referral Continue  aspirin, statin, diabetes control for history of subacute acute left cerebellar ischemic infarction in 2015 Depression scale elevated, will begin Zoloft Important to exercise every day 30 minutes of walking - brain healthy activities reviewed and encouraged (nutrition, physical, mental, social activities) Continue follow-up with primary care For follow-up here in 6 months Nilda Riggs, [facility], [facility], APRN  [facility] [address], [address] [place], [place] [address] [phone]

## 2018-06-17 DIAGNOSIS — G4733 Obstructive sleep apnea (adult) (pediatric): Secondary | ICD-10-CM | POA: Diagnosis not present

## 2018-06-20 ENCOUNTER — Ambulatory Visit: Payer: PPO | Admitting: Nurse Practitioner

## 2018-06-20 DIAGNOSIS — M329 Systemic lupus erythematosus, unspecified: Secondary | ICD-10-CM | POA: Diagnosis not present

## 2018-06-20 DIAGNOSIS — H353132 Nonexudative age-related macular degeneration, bilateral, intermediate dry stage: Secondary | ICD-10-CM | POA: Diagnosis not present

## 2018-06-20 DIAGNOSIS — E119 Type 2 diabetes mellitus without complications: Secondary | ICD-10-CM | POA: Diagnosis not present

## 2018-06-20 DIAGNOSIS — Z79899 Other long term (current) drug therapy: Secondary | ICD-10-CM | POA: Diagnosis not present

## 2018-07-01 DIAGNOSIS — E7800 Pure hypercholesterolemia, unspecified: Secondary | ICD-10-CM | POA: Diagnosis not present

## 2018-07-01 DIAGNOSIS — Z125 Encounter for screening for malignant neoplasm of prostate: Secondary | ICD-10-CM | POA: Diagnosis not present

## 2018-07-01 DIAGNOSIS — I10 Essential (primary) hypertension: Secondary | ICD-10-CM | POA: Diagnosis not present

## 2018-07-01 DIAGNOSIS — Z136 Encounter for screening for cardiovascular disorders: Secondary | ICD-10-CM | POA: Diagnosis not present

## 2018-07-01 DIAGNOSIS — E1169 Type 2 diabetes mellitus with other specified complication: Secondary | ICD-10-CM | POA: Diagnosis not present

## 2018-07-05 DIAGNOSIS — R29898 Other symptoms and signs involving the musculoskeletal system: Secondary | ICD-10-CM | POA: Diagnosis not present

## 2018-07-05 DIAGNOSIS — Z1159 Encounter for screening for other viral diseases: Secondary | ICD-10-CM | POA: Diagnosis not present

## 2018-07-05 DIAGNOSIS — E7800 Pure hypercholesterolemia, unspecified: Secondary | ICD-10-CM | POA: Diagnosis not present

## 2018-07-05 DIAGNOSIS — Z0000 Encounter for general adult medical examination without abnormal findings: Secondary | ICD-10-CM | POA: Diagnosis not present

## 2018-07-05 DIAGNOSIS — R350 Frequency of micturition: Secondary | ICD-10-CM | POA: Diagnosis not present

## 2018-07-05 DIAGNOSIS — I10 Essential (primary) hypertension: Secondary | ICD-10-CM | POA: Diagnosis not present

## 2018-07-05 DIAGNOSIS — F329 Major depressive disorder, single episode, unspecified: Secondary | ICD-10-CM | POA: Diagnosis not present

## 2018-07-05 DIAGNOSIS — E1169 Type 2 diabetes mellitus with other specified complication: Secondary | ICD-10-CM | POA: Diagnosis not present

## 2018-07-05 DIAGNOSIS — Z23 Encounter for immunization: Secondary | ICD-10-CM | POA: Diagnosis not present

## 2018-07-08 DIAGNOSIS — G4733 Obstructive sleep apnea (adult) (pediatric): Secondary | ICD-10-CM | POA: Diagnosis not present

## 2018-07-13 DIAGNOSIS — N401 Enlarged prostate with lower urinary tract symptoms: Secondary | ICD-10-CM | POA: Diagnosis not present

## 2018-07-13 DIAGNOSIS — R350 Frequency of micturition: Secondary | ICD-10-CM | POA: Diagnosis not present

## 2018-07-13 DIAGNOSIS — R351 Nocturia: Secondary | ICD-10-CM | POA: Diagnosis not present

## 2018-07-14 DIAGNOSIS — G4733 Obstructive sleep apnea (adult) (pediatric): Secondary | ICD-10-CM | POA: Diagnosis not present

## 2018-07-17 DIAGNOSIS — G4733 Obstructive sleep apnea (adult) (pediatric): Secondary | ICD-10-CM | POA: Diagnosis not present

## 2018-07-22 DIAGNOSIS — R413 Other amnesia: Secondary | ICD-10-CM | POA: Diagnosis not present

## 2018-07-22 DIAGNOSIS — R430 Anosmia: Secondary | ICD-10-CM | POA: Diagnosis not present

## 2018-07-22 DIAGNOSIS — E669 Obesity, unspecified: Secondary | ICD-10-CM | POA: Diagnosis not present

## 2018-07-22 DIAGNOSIS — Z6834 Body mass index (BMI) 34.0-34.9, adult: Secondary | ICD-10-CM | POA: Diagnosis not present

## 2018-07-22 DIAGNOSIS — M5136 Other intervertebral disc degeneration, lumbar region: Secondary | ICD-10-CM | POA: Diagnosis not present

## 2018-07-22 DIAGNOSIS — M150 Primary generalized (osteo)arthritis: Secondary | ICD-10-CM | POA: Diagnosis not present

## 2018-07-22 DIAGNOSIS — M329 Systemic lupus erythematosus, unspecified: Secondary | ICD-10-CM | POA: Diagnosis not present

## 2018-07-25 ENCOUNTER — Emergency Department (HOSPITAL_COMMUNITY): Payer: PPO

## 2018-07-25 ENCOUNTER — Emergency Department (HOSPITAL_COMMUNITY)
Admission: EM | Admit: 2018-07-25 | Discharge: 2018-07-25 | Disposition: A | Discharge status: Home / Self Care | Payer: PPO | Attending: Emergency Medicine | Admitting: Emergency Medicine

## 2018-07-25 DIAGNOSIS — E1165 Type 2 diabetes mellitus with hyperglycemia: Secondary | ICD-10-CM | POA: Diagnosis not present

## 2018-07-25 DIAGNOSIS — R569 Unspecified convulsions: Secondary | ICD-10-CM

## 2018-07-25 DIAGNOSIS — Z7982 Long term (current) use of aspirin: Secondary | ICD-10-CM | POA: Insufficient documentation

## 2018-07-25 DIAGNOSIS — R4781 Slurred speech: Secondary | ICD-10-CM | POA: Diagnosis not present

## 2018-07-25 DIAGNOSIS — E119 Type 2 diabetes mellitus without complications: Secondary | ICD-10-CM | POA: Insufficient documentation

## 2018-07-25 DIAGNOSIS — Z79899 Other long term (current) drug therapy: Secondary | ICD-10-CM | POA: Diagnosis not present

## 2018-07-25 DIAGNOSIS — I10 Essential (primary) hypertension: Secondary | ICD-10-CM | POA: Diagnosis not present

## 2018-07-25 DIAGNOSIS — R404 Transient alteration of awareness: Secondary | ICD-10-CM | POA: Diagnosis not present

## 2018-07-25 DIAGNOSIS — G459 Transient cerebral ischemic attack, unspecified: Secondary | ICD-10-CM | POA: Diagnosis not present

## 2018-07-25 DIAGNOSIS — M3210 Systemic lupus erythematosus, organ or system involvement unspecified: Secondary | ICD-10-CM | POA: Diagnosis not present

## 2018-07-25 DIAGNOSIS — R410 Disorientation, unspecified: Secondary | ICD-10-CM | POA: Diagnosis not present

## 2018-07-25 DIAGNOSIS — R0902 Hypoxemia: Secondary | ICD-10-CM | POA: Diagnosis not present

## 2018-07-25 DIAGNOSIS — R42 Dizziness and giddiness: Secondary | ICD-10-CM | POA: Diagnosis not present

## 2018-07-25 LAB — URINALYSIS, ROUTINE W REFLEX MICROSCOPIC
BILIRUBIN URINE: NEGATIVE
GLUCOSE, UA: NEGATIVE mg/dL
HGB URINE DIPSTICK: NEGATIVE
Ketones, ur: NEGATIVE mg/dL
Leukocytes, UA: NEGATIVE
Nitrite: NEGATIVE
Protein, ur: NEGATIVE mg/dL
Specific Gravity, Urine: 1.013 (ref 1.005–1.030)
pH: 6 (ref 5.0–8.0)

## 2018-07-25 LAB — COMPREHENSIVE METABOLIC PANEL
ALBUMIN: 4 g/dL (ref 3.5–5.0)
ALK PHOS: 45 U/L (ref 38–126)
ALT: 18 U/L (ref 0–44)
ANION GAP: 6 (ref 5–15)
AST: 25 U/L (ref 15–41)
BILIRUBIN TOTAL: 0.7 mg/dL (ref 0.3–1.2)
BUN: 21 mg/dL (ref 8–23)
CO2: 24 mmol/L (ref 22–32)
Calcium: 8.9 mg/dL (ref 8.9–10.3)
Chloride: 105 mmol/L (ref 98–111)
Creatinine, Ser: 1.51 mg/dL — ABNORMAL HIGH (ref 0.61–1.24)
GFR calc Af Amer: 52 mL/min — ABNORMAL LOW (ref >60)
GFR calc non Af Amer: 45 mL/min — ABNORMAL LOW (ref >60)
GLUCOSE: 190 mg/dL — AB (ref 70–99)
Potassium: 4.9 mmol/L (ref 3.5–5.1)
Sodium: 135 mmol/L (ref 135–145)
TOTAL PROTEIN: 7.3 g/dL (ref 6.5–8.1)

## 2018-07-25 LAB — DIFFERENTIAL
Abs Immature Granulocytes: 0 10*3/uL (ref 0.00–0.07)
Basophils Absolute: 0 10*3/uL (ref 0.0–0.1)
Basophils Relative: 0 %
EOS PCT: 1 %
Eosinophils Absolute: 0 10*3/uL (ref 0.0–0.5)
IMMATURE GRANULOCYTES: 0 %
LYMPHS ABS: 1.3 10*3/uL (ref 0.7–4.0)
LYMPHS PCT: 35 %
Monocytes Absolute: 0.3 10*3/uL (ref 0.1–1.0)
Monocytes Relative: 8 %
Neutro Abs: 2.1 10*3/uL (ref 1.7–7.7)
Neutrophils Relative %: 56 %

## 2018-07-25 LAB — I-STAT CHEM 8, ED
BUN: 24 mg/dL — ABNORMAL HIGH (ref 8–23)
CALCIUM ION: 1.09 mmol/L — AB (ref 1.15–1.40)
CREATININE: 1.6 mg/dL — AB (ref 0.61–1.24)
Chloride: 104 mmol/L (ref 98–111)
GLUCOSE: 187 mg/dL — AB (ref 70–99)
HCT: 31 % — ABNORMAL LOW (ref 39.0–52.0)
HEMOGLOBIN: 10.5 g/dL — AB (ref 13.0–17.0)
POTASSIUM: 4.8 mmol/L (ref 3.5–5.1)
Sodium: 138 mmol/L (ref 135–145)
TCO2: 27 mmol/L (ref 22–32)

## 2018-07-25 LAB — PROTIME-INR
INR: 1.13
PROTHROMBIN TIME: 14.4 s (ref 11.4–15.2)

## 2018-07-25 LAB — CBG MONITORING, ED: GLUCOSE-CAPILLARY: 176 mg/dL — AB (ref 70–99)

## 2018-07-25 LAB — CBC
HEMATOCRIT: 33.8 % — AB (ref 39.0–52.0)
Hemoglobin: 10.9 g/dL — ABNORMAL LOW (ref 13.0–17.0)
MCH: 32.1 pg (ref 26.0–34.0)
MCHC: 32.2 g/dL (ref 30.0–36.0)
MCV: 99.4 fL (ref 80.0–100.0)
NRBC: 0 % (ref 0.0–0.2)
Platelets: 161 10*3/uL (ref 150–400)
RBC: 3.4 MIL/uL — ABNORMAL LOW (ref 4.22–5.81)
RDW: 11.6 % (ref 11.5–15.5)
WBC: 3.8 10*3/uL — ABNORMAL LOW (ref 4.0–10.5)

## 2018-07-25 LAB — I-STAT TROPONIN, ED: TROPONIN I, POC: 0.01 ng/mL (ref 0.00–0.08)

## 2018-07-25 LAB — APTT: aPTT: 50 seconds — ABNORMAL HIGH (ref 24–36)

## 2018-07-25 NOTE — ED Triage Notes (Addendum)
Per [facility], pt was at home with wife at 1615 pt started staring off and pt would not focus, episode lasted for 1 minute then pt started to respond with slurred speech and having difficulty speaking. All sx resolved at 1702. Oriented to normal now. Pt has recent hx of dementia. Pt had general malaise x 2 days. Had difficulty ambulating yesterday morning. VSS

## 2018-07-25 NOTE — ED Notes (Signed)
Pt ambulated with standby assist, steady gait noted. Pt denies dizziness/lightheadedness.  EDP aware.

## 2018-07-25 NOTE — ED Notes (Signed)
Dr. Plunkett at bedside.  

## 2018-07-25 NOTE — ED Notes (Signed)
E-signature not available, pt verbalized understanding of DC instructions  

## 2018-07-25 NOTE — ED Provider Notes (Signed)
[facility] EMERGENCY DEPARTMENT Provider Note   CSN: [id number] Arrival date & time: 07/25/18  1735     History   Chief Complaint Chief Complaint  Patient presents with  . Transient Ischemic Attack    HPI David Irwin is a 72 y.o. male.  Patient is a 72 year old male with a history of diabetes, hypertension, lupus, memory loss who is presenting today by EMS after having an unresponsive episode.  Patient's wife gives most of the history and states that over the last 2 days patient has not been quite himself.  He slept a lot on Sunday and was having some difficulty getting to the bathroom stating he just did not feel well and had a headache.  She states yesterday he slept almost all day but then last night seem to be more of himself.  He even said that he was starting to feel better.  However today while they were watching TV on the couch he suddenly started acting abnormal.  She tried to talk with him but he was not responding.  She states that this time also she noticed both of his hands were jerking which she described as seizure-like movement.  This lasted for approximately 1 minute and then patient slowly came back to acting his normal self.  Initially he spoke very slowly but clearly and then within about 30 minutes to an hour he was back to his normal self.  She did not notice any facial droop, unilateral weakness.  When EMS got there patient was able to walk to the stretcher.  He currently is complaining of only a mild headache.  He denies vision changes, unilateral weakness.  He denies any neck pain, chest pain, shortness of breath, abdominal pain, nausea or vomiting.  Wife states he has been eating regularly.  Approximately 1 month 1 of his diabetic medications and Zoloft was discontinued but other than that he has been taking all of his medications  The history is provided by the patient and the spouse.    Past Medical History:  Diagnosis Date  . Diabetes  mellitus without complication (HCC)   . Hypertension   . Lupus   . Memory loss   . Murmur, cardiac   . Nocturia     Patient Active Problem List   Diagnosis Date Noted  . Depression 12/16/2017  . Essential hypertension 03/26/2017  . Hyperlipidemia 03/26/2017  . Cardiac murmur 03/26/2017  . Mild cognitive impairment, so stated 05/01/2013  . Memory loss 05/01/2013    Past Surgical History:  Procedure Laterality Date  . APPENDECTOMY    . EYE SURGERY          Home Medications    Prior to Admission medications   Medication Sig Start Date End Date Taking? Authorizing Provider  aspirin 81 MG tablet Take 81 mg by mouth daily.    [provider]  CYANOCOBALAMIN PO Take by mouth daily.    [provider]  fish oil-omega-3 fatty acids 1000 MG capsule 3 g daily.    [provider]  glimepiride (AMARYL) 2 MG tablet 2 mg daily. 01/19/17   [provider]  hydroxychloroquine (PLAQUENIL) 200 MG tablet Take by mouth daily.    [provider]  KLOR-CON M20 20 MEQ tablet Take 1 tablet by mouth daily. 04/27/13   [provider]  Multiple Vitamin tablet Take 1 tablet by mouth daily.    [provider]  sertraline (ZOLOFT) 25 MG tablet TAKE 1 TABLET BY MOUTH  EVERY DAY 02/09/18   Penumalli, Glenford Bayley, MD  simvastatin (ZOCOR) 20 MG tablet Take 20 mg by mouth every evening.    [provider]  tamsulosin (FLOMAX) 0.4 MG CAPS Take 1 capsule by mouth daily. 04/27/13   [provider]  TURMERIC PO Take by mouth.    [provider]  valsartan-hydrochlorothiazide (DIOVAN-HCT) 160-25 MG per tablet Take 1 tablet by mouth daily.  04/27/13   [provider]    Family History Family History  Problem Relation Age of Onset  . Kidney disease Mother   . Dementia Father     Social History Social History   Tobacco Use  . Smoking status: Never Smoker  . Smokeless tobacco: Never Used  Substance Use Topics  .  Alcohol use: No  . Drug use: No     Allergies   Liraglutide and Shellfish allergy   Review of Systems Review of Systems  All other systems reviewed and are negative.    Physical Exam Updated Vital Signs BP (!) 160/69   Pulse 90   Temp 97.7 F (36.5 C) (Oral)   Resp (!) 27   Ht 5\' 7"  (1.702 m)   Wt 103 kg   SpO2 98%   BMI 35.55 kg/m   Physical Exam  Constitutional: He appears well-developed and well-nourished. No distress.  HENT:  Head: Normocephalic and atraumatic.  Mouth/Throat: Oropharynx is clear and moist.  Eyes: Pupils are equal, round, and reactive to light. Conjunctivae and EOM are normal.  Neck: Normal range of motion. Neck supple.  Cardiovascular: Normal rate, regular rhythm and intact distal pulses.  No murmur heard. Pulmonary/Chest: Effort normal and breath sounds normal. No respiratory distress. He has no wheezes. He has no rales.  Abdominal: Soft. He exhibits no distension. There is no tenderness. There is no rebound and no guarding.  Musculoskeletal: Normal range of motion. He exhibits no edema or tenderness.  Neurological: He is alert.  4 out of 5 strength in bilateral lower extremities.  No pronator drift noted.  Normal speech.  No visual field cuts.  Oriented to person and place  Skin: Skin is warm and dry. No rash noted. No erythema.  Psychiatric: He has a normal mood and affect. His behavior is normal.  Nursing note and vitals reviewed.    ED Treatments / Results  Labs (all labs ordered are listed, but only abnormal results are displayed) Labs Reviewed  APTT - Abnormal; Notable for the following components:      Result Value   aPTT 50 (*)    All other components within normal limits  CBC - Abnormal; Notable for the following components:   WBC 3.8 (*)    RBC 3.40 (*)    Hemoglobin 10.9 (*)    HCT 33.8 (*)    All other components within normal limits  COMPREHENSIVE METABOLIC PANEL - Abnormal; Notable for the following components:    Glucose, Bld 190 (*)    Creatinine, Ser 1.51 (*)    GFR calc non Af Amer 45 (*)    GFR calc Af Amer 52 (*)    All other components within normal limits  CBG MONITORING, ED - Abnormal; Notable for the following components:   Glucose-Capillary 176 (*)    All other components within normal limits  I-STAT CHEM 8, ED - Abnormal; Notable for the following components:   BUN 24 (*)    Creatinine, Ser 1.60 (*)    Glucose, Bld 187 (*)    Calcium, Ion  1.09 (*)    Hemoglobin 10.5 (*)    HCT 31.0 (*)    All other components within normal limits  PROTIME-INR  DIFFERENTIAL  URINALYSIS, ROUTINE W REFLEX MICROSCOPIC  I-STAT TROPONIN, ED    EKG EKG Interpretation  Date/Time:  Monday July 25 2018 17:41:57 EDT Ventricular Rate:  66 PR Interval:    QRS Duration: 90 QT Interval:  392 QTC Calculation: 411 R Axis:   21 Text Interpretation:  Sinus rhythm No previous tracing Confirmed by Gwyneth Sprout ([id number]) on 07/25/2018 6:32:03 PM   Radiology Dg Chest 2 View  Result Date: 07/25/2018 CLINICAL DATA:  Confusion today. EXAM: CHEST - 2 VIEW COMPARISON:  Chest x-ray dated 09/10/2010. FINDINGS: Heart size and mediastinal contours are stable. Lungs are clear. No pleural effusion or pneumothorax seen. No acute or suspicious osseous finding. IMPRESSION: No active cardiopulmonary disease. No evidence of pneumonia or pulmonary edema. Electronically Signed   By: Bary Richard M.D.   On: 07/25/2018 20:47   Ct Head Wo Contrast  Result Date: 07/25/2018 CLINICAL DATA:  Staring spells, slurred speech, difficulty speaking. EXAM: CT HEAD WITHOUT CONTRAST TECHNIQUE: Contiguous axial images were obtained from the base of the skull through the vertex without intravenous contrast. COMPARISON:  MR brain 08/18/2014. FINDINGS: Brain: No evidence for acute infarction, hemorrhage, mass lesion, hydrocephalus, or extra-axial fluid. Generalized atrophy. White matter hypoattenuation, likely chronic microvascular ischemic  change. Dystrophic calcification, RIGHT brainstem. Vascular: Calcification of the cavernous internal carotid arteries consistent with cerebrovascular atherosclerotic disease. No signs of intracranial large vessel occlusion. Skull: Calvarium intact. Sinuses/Orbits: No acute sinus disease. BILATERAL cataract extraction. Other: No mastoid or middle ear fluid. IMPRESSION: Atrophy.  Small vessel disease. No acute intracranial findings. Cerebrovascular atherosclerosis. Electronically Signed   By: Elsie Stain M.D.   On: 07/25/2018 19:56    Procedures Procedures (including critical care time)  Medications Ordered in ED Medications - No data to display   Initial Impression / Assessment and Plan / ED Course  I have reviewed the triage vital signs and the nursing notes.  Pertinent labs & imaging results that were available during my care of the patient were reviewed by me and considered in my medical decision making (see chart for details).    Patient presenting today with symptoms most consistent with seizure-like activity this afternoon while he was at home.  Wife states that almost 40 years ago he had a seizure but he does not take any seizure prophylaxis and has not had one in years.  Patient currently complains of a mild headache but otherwise has no complaints.  Head CT without acute findings but does have prior chronic vessel disease.  Patient's labs thus far show CMP with most likely chronic kidney disease with creatinine 1.5, normal electrolytes and CBC within normal limits.  Checking a chest x-ray and UA to ensure no other acute findings as patient over the last few days had not been feeling very well.  However today he had felt better.  9:54 PM lAbs and imaging are within normal limits.  She was able to ambulate here without difficulty.  Feel the patient's symptoms today were related to a sugar.  Patient does see Dr. Marjory Lies and they will call the office tomorrow.  He has follow-up with the PA  next Wednesday.  He and his family were given strict return precautions and they were comfortable with discharge.   Final Clinical Impressions(s) / ED Diagnoses   Final diagnoses:  Seizure [facility])    ED Discharge  Orders    None       Gwyneth Sprout, MD 07/25/18 2156

## 2018-07-26 ENCOUNTER — Other Ambulatory Visit: Payer: Self-pay | Admitting: *Deleted

## 2018-07-26 ENCOUNTER — Telehealth: Payer: Self-pay | Admitting: Nurse Practitioner

## 2018-07-26 NOTE — Patient Outreach (Signed)
[facility]) Care Management  07/26/2018  David Irwin July 14, 1946 [id number]   Telephone Screen  Referral Date: 07/25/18 Referral Source: nurse call center Referral Reason: confusion - he woke up from a nap experiencing confusion and did not respond to his name and could not talk - Seizure Recommended gong to ED  Insurance:HTA    Outreach attempt # 1 His wife answered  [facility] RN CM left HIPAA compliant voicemail message along with CM's contact info.   Plan: [facility] RN CM sent an unsuccessful outreach letter and scheduled this patient for another call attempt within 4 business days   Alaysha Jefcoat L. Noelle Penner, RN, BSN, CCM [facility] Telephonic Care Management Care Coordinator Office number [phone] Mobile number [phone]  Main [facility] number [phone] Fax number [phone]

## 2018-07-26 NOTE — Telephone Encounter (Signed)
Spoke to wife and made app with Dr. Marjory Lies on 07-27-18 at 1500, check in 1430.  She verbalized understanding.  Cancelled appt that was with CM/NP 08/10/18.

## 2018-07-26 NOTE — Telephone Encounter (Signed)
Pts wife ([name]) requesting a call stating the pt was taken to the ED last night with a possible seizure. Requesting a call to discuss further

## 2018-07-26 NOTE — Patient Outreach (Addendum)
[facility]) Care Management  07/26/2018  David Irwin 02-Feb-1946 [id number]   Telephone Screen  Referral Date: 07/25/18 Referral Source: nurse call center Referral Reason: confusion - he woke up from a nap experiencing confusion and did not respond to his name and could not talk - Seizure Recommended gong to ED  Insurance:HTA   Patient returned a call to [facility] RN CM Patient is able to verify HIPAA Reviewed and addressed referral to [facility] with patient He states he is doing much better and just came from outside. 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 He is independent/assist with iADLs. They confirm that transportation to medical is not a concern He reports if he is not able to drive David Irwin assists  Conditions TIA, Seizures, Systemic lupus, HTN, memory loss, hyperlipidemia, depression, cardiac murmur, obstructive sleep apnea, DM (last A1c 6.7 on 07/01/18 , ED, enlarged prostate  DME: CPAP  Falls There have been reported falls with minimum injury and his MDs aware and his neurologist has been evaluating him for this and memory issues  Medication They discussed that there are cost concerns with plaquenil - This is prescribed by lupus MD, David Irwin The cost is around $100 now  He was on  Zoloft but was noted to feel more depressed and it was discontinued They are noted lightheadedness and dizziness with use of Telmisartan This was changed a few weeks ago   [facility] RN CM discussed the importance of purchasing a BP cuff    Appointment David Irwin was seen a few weeks ago around 07/05/18 He is scheduled to be seen on 07/27/18 for a new CPAP mask fitting   Advance Directives: They have the forms for living will and POA but have not completed them  They want more times to  review and discuss their wishes  CM encouraged a call to [facility] CM when they are ready for a referral to [facility]   Consent: [facility] RN CM reviewed [facility] services with patient. Patient gave verbal consent for services [facility] pharmacy CM encouraged a return call for benefits of [facility] SW and health coach when ready  Plan [facility] RN CM will refer David Emond to [facility] pharmacy to see if there is any assistance for the cost of the Plaquenil or any assistance for the side effects of his Telmisartan  Please review medications for contraindications   David L. Noelle Penner, RN, BSN, CCM [facility] Telephonic Care Management Care Coordinator Office number [phone] Mobile number [phone]  Main [facility] number [phone] Fax number [phone]

## 2018-07-26 NOTE — Telephone Encounter (Signed)
Ok to see me in clinic. -VRP

## 2018-07-27 ENCOUNTER — Ambulatory Visit (INDEPENDENT_AMBULATORY_CARE_PROVIDER_SITE_OTHER): Payer: PPO | Admitting: Diagnostic Neuroimaging

## 2018-07-27 ENCOUNTER — Ambulatory Visit: Payer: PPO | Admitting: *Deleted

## 2018-07-27 ENCOUNTER — Encounter: Payer: Self-pay | Admitting: Diagnostic Neuroimaging

## 2018-07-27 VITALS — BP 123/66 | HR 74 | Wt 231.8 lb

## 2018-07-27 DIAGNOSIS — F03B0 Unspecified dementia, moderate, without behavioral disturbance, psychotic disturbance, mood disturbance, and anxiety: Secondary | ICD-10-CM

## 2018-07-27 DIAGNOSIS — R569 Unspecified convulsions: Secondary | ICD-10-CM

## 2018-07-27 DIAGNOSIS — F0390 Unspecified dementia without behavioral disturbance: Secondary | ICD-10-CM | POA: Diagnosis not present

## 2018-07-27 MED ORDER — LEVETIRACETAM 500 MG PO TABS: 500.0000 mg | ORAL_TABLET | Freq: Two times a day (BID) | ORAL | 12 refills | Status: DC

## 2018-07-27 NOTE — Patient Instructions (Signed)
  SEIZURE (due to dementia) - start levetiracetam 500mg  twice a day   MEMORY LOSS (dementia + depression) - consider memantine - safety / supervision issues reviewed - caregiver resources provided - no driving; caution with finances - continue aspirin, statin, diabetes control - brain healthy activities reviewed and encouraged (nutrition, physical, mental, social activities)  DEPRESSION - follow up with PCP / psychiatry

## 2018-07-27 NOTE — Progress Notes (Signed)
PATIENT: David Irwin DOB: September 09, 1946  REASON FOR VISIT: routine follow up for memory HISTORY FROM: patient, wife  Chief Complaint  Patient presents with  . Seizures    rm 7, ED FU, wife- [name] "I don't remember anything, wife witnessed"  MMSE 17     HISTORY OF PRESENT ILLNESS:  UPDATE (07/27/18, VRP): Since last visit, doing poorly. Symptoms are progressive. More memory loss and gait diff. More decline in ADLs. Not driving. Also had a seizure like event on Monday (07/25/18) --> staring and shaking spell x 1 minute. Post-ictal confusion. Went to ER.   UPDATE 02/05/17: Since last visit, memory loss issues continue. Depression is mild and continues. Having some low back pain issues. Still trying to be active in the yard.   UPDATE 01/29/16: Since last visit, doing well, memory loss, but still with abnl spells (usually from waking up state) --> 3 episodes since last visit. Now improved with tinkering IT sales professional, outdoor activities).   UPDATE 08/16/15: Since last visit, memory loss is stable / slightly improved especially since restarting CPAP.  Wife also noted deep depression.  UPDATE 05/17/15: Since last visit, memory loss continues. Daydreaming staring spells are less often. Short term memory is poor. Long term memory stable. Gait stable.   UPDATE 12/10/14: Since last visit, doing about the same. 2 weeks ago patient had episode of staring spell 2, lasting less than a minute, when patient's wife was talking to him. Wife noted that he seemed to be acting differently. He was closing his eyes and shaking his head. She asked him what was wrong and he did not respond. She insisted on him trying to explain what was going on. He moved to stretch his muscles, rolled his neck, and finally was able to say some words. However his verbal communication was not related to what his wife was asking about. He seemed to be talking about tangential topics. This lasted just for less than a minute or so. The day  before patient had been trying a new pain medication, but he does not recall the name of it. Overall general memory loss has slightly progressed.  UPDATE 09/10/14: Since last visit, was doing well, except 1 more brief staring spell event few nights ago. Patient was able to respond verbally when wife called to him. Spell lasted < . No shaking.   UPDATE 08/01/14 (VRP): Since last visit, was stable, except has had intermittent spells according to wife: staring, inability to speak, jittery movements of head and hands, lasting 1 minute; no tongue biting or incontinence. No post-ictal confusion. No warning. Has had spells 1 every 3-4 months x 1 year, and then 2 spells in last 2 weeks. No triggers  UPDATE 05/04/14 (LL): Since last visit, patient has been well, but recognizes he has more difficulty remembering places he had been in the last year or two. He does not remember coming to this office one year ago. He has had no decline in activities of daily living. His blood sugars are well controlled, hemoglobin A1c is 6.0. He does not get regular exercise.  He has sleep apnea and has a CPAP machine and tries to use it but states that the full mask is uncomfortable. MMSE 24/30, AFT 12 and CDT 4/4.  UPDATE 05/02/13 (LL): Patient and wife come to office for 6 month follow up for memory testing. Both feel that memory has been stable, no more instances of getting lost while driving. Patient feels like he has not gotten any  worse, he has trouble remembering things that he knows sometimes, but he states it usually comes to him with time. Just not on "his" time.   PRIOR HPI (10/28/12, VRP): 72 year old right-handed male with history of lupus, diabetes, hypercholesterolemia, and hypertension, here for evaluation of memory loss. Summary 2013, patient got lost while driving twice on [address]. He was briefly turned around but eventually was able to make his way to his destination. He had a third episode in December  2013. Since that time he has had no further events. His wife has noticed some short-term memory problems especially with verbal and auditory memory. One time he forgot his granddaughter's name (she is 52 years old and lives [place]). He has 3 grandchildren total. Patient denies any significant sleep or mood problems. Patient's wife reports some mild mood swings.    REVIEW OF SYSTEMS: Full 14 system review of systems performed and negative except: as per HPI.    ALLERGIES: Allergies  Allergen Reactions  . Liraglutide Other (See Comments)    hallucinations   . Shellfish Allergy Hives    HOME MEDICATIONS: Outpatient Medications Prior to Visit  Medication Sig Dispense Refill  . acetaminophen (TYLENOL) 500 MG tablet Take 500 mg by mouth every 6 (six) hours as needed for headache (pain).    Marland Kitchen aspirin EC 81 MG tablet Take 81 mg by mouth daily.    . fluticasone (FLONASE) 50 MCG/ACT nasal spray Place 2 sprays into both nostrils at bedtime as needed for allergies or rhinitis.   5  . hydroxychloroquine (PLAQUENIL) 200 MG tablet Take 200 mg by mouth 2 (two) times daily.     . Omega-3 Fatty Acids (OMEGA 3 PO) Take 1 capsule by mouth daily.    . polyvinyl alcohol (ARTIFICIAL TEARS) 1.4 % ophthalmic solution Place 1 drop into both eyes daily as needed for dry eyes.    . potassium chloride SA (K-DUR,KLOR-CON) 20 MEQ tablet Take 20 mEq by mouth daily.     Marland Kitchen PRESCRIPTION MEDICATION Inhale into the lungs at bedtime. CPAP    . PRESCRIPTION MEDICATION Apply 1 application topically daily as needed (itching on forehead). Prescription cream    . simvastatin (ZOCOR) 20 MG tablet Take 20 mg by mouth at bedtime.     . tamsulosin (FLOMAX) 0.4 MG CAPS Take 0.4 mg by mouth daily.     Marland Kitchen telmisartan-hydrochlorothiazide (MICARDIS HCT) 40-12.5 MG tablet Take 2 tablets by mouth daily.  4  . vitamin B-12 (CYANOCOBALAMIN) 1000 MCG tablet Take 1,000 mcg by mouth daily.    . sertraline (ZOLOFT) 25 MG tablet TAKE 1 TABLET  BY MOUTH EVERY DAY (Patient not taking: Reported on 07/25/2018) 90 tablet 1   No facility-administered medications prior to visit.     PHYSICAL EXAM Vitals:   07/27/18 1442  BP: 123/66  Pulse: 74  Weight: 231 lb 12.8 oz (105.1 kg)    Body mass index is 36.31 kg/m.  MMSE - Mini Mental State Exam 07/27/2018 12/16/2017 02/05/2017 01/29/2016 08/16/2015 05/17/2015 08/01/2014  Orientation to time 2 4 3 3 3 4 4   Orientation to Place 4 4 5 5 4 5 5   Registration 3 3 3 3 3 3 3   Attention/ Calculation 1 1 2 5 5 1 4   Recall 0 2 2 1 3 2 2   Language- name 2 objects 2 2 2 2 2 2 2   Language- repeat 1 0 0 1 1 1 1   Language- follow 3 step command 3 3 3 3 3  2  2  Language- read & follow direction 1 1 1 1 1 1 1   Write a sentence 0 1 1 1 1 1 1   Write a sentence-comments no subject - - - - - -  Copy design 0 0 0 1 1 0 0  Total score 17 21 22 26 27 22 25     GENERAL EXAM: Patient is in no distress; well developed, nourished and groomed; neck is supple; CALM, SOFT SPOKEN  CARDIOVASCULAR: Regular rate and rhythm, no murmurs, no carotid bruits  NEUROLOGIC: MENTAL STATUS: awake, alert, comprehension intact, naming intact, fund of knowledge appropriate; SLOW RESPONSES, NO FRONTAL RELEASE SIGNS; MASKED FACIES CRANIAL NERVE: PINPOINT PUPILS, pupils equal and reactive to light, visual fields full to confrontation, extraocular muscles intact, no nystagmus, facial sensation symmetric, DECR RIGHT NL FOLD, hearing intact, palate elevates symmetrically, uvula midline, shoulder shrug symmetric, tongue midline. MOTOR: normal bulk and tone, full strength in the BUE, BLE; MILD BRADYKINESIA IN BUE SENSORY: normal and symmetric to light touch  COORDINATION: finger-nose-finger, fine finger movements normal REFLEXES: deep tendon reflexes present and symmetric GAIT/STATION: narrow based gait; SLOW GAIT; DECR ARM SWING, SHUFFLING GAIT; romberg is negative   DIAGNOSTIC DATA (LABS, IMAGING, TESTING)  CBC    Component  Value Date/Time   WBC 3.8 (L) 07/25/2018 1806   RBC 3.40 (L) 07/25/2018 1806   HGB 10.5 (L) 07/25/2018 1822   HCT 31.0 (L) 07/25/2018 1822   PLT 161 07/25/2018 1806   MCV 99.4 07/25/2018 1806   MCH 32.1 07/25/2018 1806   MCHC 32.2 07/25/2018 1806   RDW 11.6 07/25/2018 1806   LYMPHSABS 1.3 07/25/2018 1806   MONOABS 0.3 07/25/2018 1806   EOSABS 0.0 07/25/2018 1806   BASOSABS 0.0 07/25/2018 1806    CMP Latest Ref Rng & Units 07/25/2018 07/25/2018  Glucose 70 - 99 mg/dL 161(W) 960(A)  BUN 8 - 23 mg/dL 54(U) 21  Creatinine 9.81 - 1.24 mg/dL 1.91(Y) 7.82(N)  Sodium 135 - 145 mmol/L 138 135  Potassium 3.5 - 5.1 mmol/L 4.8 4.9  Chloride 98 - 111 mmol/L 104 105  CO2 22 - 32 mmol/L - 24  Calcium 8.9 - 10.3 mg/dL - 8.9  Total Protein 6.5 - 8.1 g/dL - 7.3  Total Bilirubin 0.3 - 1.2 mg/dL - 0.7  Alkaline Phos 38 - 126 U/L - 45  AST 15 - 41 U/L - 25  ALT 0 - 44 U/L - 18     LABS: B12 791, TSH 2.14   11/08/12 MRI BRAIN - Mild periventricular and subcortical foci of T2 hyperintensities. These findings are non-specific and considerations include autoimmune, inflammatory, post-infectious, microvascular ischemia or migraine associated etiologies. No abnormal enhancing lesions.   08/18/14 MRI brain 1. Small acute-subacute left cerebellar ischemic infarction (4mm) within the postero-lateral left cerebellar hemisphere. 2. Several bilateral cerebellar chronic lacunar infarctions.  3. Mild periventricular, subcortical and juxtacortical chronic small vessel ischemic disease.   08/18/14 MRA head (without) demonstrating: 1. The right vertebral artery is hypoplastic near the skull base and then appears to be occluded for a short segment, and then has apparent reconstitution 1.7cm proximal to the vertebrobasilar junction. 2. Left vertebral artery is dominant, with normal flow throughout the basilar artery. 3. The anterior circulation is unremarkable.  08/15/14 carotid u/s - normal  08/06/14 TTE  -  Left ventricle: The cavity size was normal. Wall thickness was normal. Systolic function was normal. The estimated ejection fraction was in the range of 60% to 65%. Wall motion was normal;there were no  regional wall motion abnormalities. Doppler parameters are consistent with abnormal left ventricularrelaxation (grade 1 diastolic dysfunction). - Aortic valve: There was mild regurgitation. - Pulmonary arteries: Systolic pressure was mildly increased. PA peak pressure: 40 mm Hg (S).  07/25/18 CT head [I reviewed images myself and agree with interpretation. -VRP]  - Atrophy.  Small vessel disease. - No acute intracranial findings. - Cerebrovascular atherosclerosis.   ASSESSMENT/PLAN:  72 y.o. male with a past medical history of Lupus and Diabetes mellitus here with mild cognitive impairment with memory loss; has had some intermittent episodes of confusion, staring, jitteriness, but unclear etiology (? daydreaming/dementia behavior).   Memory loss has fluctuated, but slightly better with restarting CPAP for OSA. Also with depression. Now worsening in 2018.   Dx:  No diagnosis found.    PLAN:  SEIZURE (due to dementia) (new problem, no additional workup) - start levetiracetam 500mg  twice a day   MEMORY LOSS (dementia + depression) - start memantine 10mg  at bedtime; increase to twice a day after 1-2 weeks - safety / supervision issues reviewed - caregiver resources provided - no driving; caution with finances - continue aspirin, statin, diabetes control - brain healthy activities reviewed and encouraged (nutrition, physical, mental, social activities)  DEPRESSION - follow up with PCP / psychiatry  Return in about 6 months (around 01/26/2019).     Suanne Marker, MD 07/27/2018, 3:24 PM Certified in Neurology, Neurophysiology and Neuroimaging  [facility] [address], [address] [place], [place] [address] [phone]

## 2018-07-29 ENCOUNTER — Other Ambulatory Visit: Payer: Self-pay | Admitting: Pharmacist

## 2018-07-29 NOTE — Patient Outreach (Signed)
[facility]) Care Management  [place]   08/01/2018  David Irwin October 01, 1946 [id number]   Reason for referral: Medication assistance (Plaquenil) and medication management (Telmisartan side effects reported)  Referral source: [facility] RN Jackelyn Poling Current insurance: [employer]  PMHx: Lupus, type 2 diabetes mellitus, mild cognitive impairment with memory loss, HTN, HLD, hx ischemic stroke, OSA, depression, new diagnosis of seizure  Per review of neurology office visit notes: Keppra started 07/27/2018, patient to consider Namenda at next appt  Successful call to Mr. Micke spouse, [name].  HIPAA identifiers verified. Spouse agreeable to review medications telephonically.     Objective: Lab Results  Component Value Date   CREATININE 1.60 (H) 07/25/2018   CREATININE 1.51 (H) 07/25/2018    No results found for: HGBA1C  Lipid Panel  No results found for: CHOL, TRIG, HDL, CHOLHDL, VLDL, LDLCALC, LDLDIRECT  BP Readings from Last 3 Encounters:  07/27/18 123/66  07/25/18 (!) 164/80  12/16/17 113/61    Allergies  Allergen Reactions  . Liraglutide Other (See Comments)    hallucinations   . Shellfish Allergy Hives    Medications Reviewed Today    Reviewed by Rudean Haskell, RPH (Pharmacist) on 07/29/18 at 56  Med List Status: <None>  Medication Order Taking? Sig Documenting Provider Last Dose Status Informant  acetaminophen (TYLENOL) 500 MG tablet [id number] Yes Take 500 mg by mouth every 6 (six) hours as needed for headache (pain). [provider] Taking Active Spouse/Significant Other  aspirin EC 81 MG tablet [id number] Yes Take 81 mg by mouth daily. [provider] Taking Active Spouse/Significant Other  clobetasol cream (TEMOVATE) 0.05 % [id number] Yes Apply 1 application topically as needed (break out lupus reaction on scalp). [provider] Taking Active   fluticasone (FLONASE) 50 MCG/ACT nasal spray  [id number] Yes Place 2 sprays into both nostrils at bedtime as needed for allergies or rhinitis.  [provider] Taking Active Spouse/Significant Other  hydroxychloroquine (PLAQUENIL) 200 MG tablet [id number] Yes Take 200 mg by mouth 2 (two) times daily.  [provider] Taking Active Spouse/Significant Other  levETIRAcetam (KEPPRA) 500 MG tablet [id number] Yes Take 1 tablet (500 mg total) by mouth 2 (two) times daily. Penumalli, Earlean Polka, MD Taking Active   Omega-3 Fatty Acids (OMEGA 3 PO) [id number] Yes Take 1 capsule by mouth daily. [provider] Taking Active Spouse/Significant Other  polyvinyl alcohol (ARTIFICIAL TEARS) 1.4 % ophthalmic solution [id number] Yes Place 1 drop into both eyes daily as needed for dry eyes. [provider] Taking Active Spouse/Significant Other  potassium chloride SA (K-DUR,KLOR-CON) 20 MEQ tablet [id number] Yes Take 20 mEq by mouth daily.  [provider] Taking Active Spouse/Significant Other  PRESCRIPTION MEDICATION [id number] Yes Inhale into the lungs at bedtime. CPAP [provider] Taking Active Spouse/Significant Other  simvastatin (ZOCOR) 20 MG tablet [id number] Yes Take 20 mg by mouth at bedtime.  [provider] Taking Active Multiple Informants  tamsulosin (FLOMAX) 0.4 MG CAPS [id number] Yes Take 0.4 mg by mouth daily.  [provider] Taking Active Spouse/Significant Other  telmisartan-hydrochlorothiazide (MICARDIS HCT) 40-12.5 MG tablet [id number] Yes Take 2 tablets by mouth daily. [provider] Taking Active Multiple Informants  vitamin B-12 (CYANOCOBALAMIN) 1000 MCG tablet [id number] Yes Take 1,000 mcg by mouth daily. [provider] Taking Active Spouse/Significant Other         ASSESSMENT: Date Discharged from Hospital: 07/25/2018 Date Medication Reconciliation Performed: 08/01/2018  Medications Discontinued at Discharge:   None  No  new medications were prescribed  at discharge.  Patient was recently discharged from hospital and all medications have been reviewed  Assessment:  Drugs sorted by system:  Neurologic/Psychologic: levetiracetam  Cardiovascular: aspirin '81mg'$ , omega-3-fatty acids, simvastatin, telmisartan-HCTZ  Pulmonary/Allergy: fluticasone NS  Topical: clobetasol cream, artificial tears  Pain: acetaminophen  Genitourinary: tamsulosin  Vitamins/Minerals/Supplements: potassium, vitamin B-12  Miscellaneous: hydroxychloroquine  Medication Review Findings:  . Dizziness symptoms per spouse, cardiologist ware, recommended that patient check blood pressure and bring log into office, no medication changes yet.  Spouse reports she will pick up BP machine this weekend.    Medication Assistance Findings:  Patient paying $90 / 90 day supply (180 tablets) of hydroxychloroquine   Extra Help:   '[]'$  Already receiving Full Extra Help  '[]'$  Already receiving Partial Extra Help  '[]'$  Eligible based on reported income and assets  '[x]'$  Not Eligible based on reported income and assets  Patient Assistance Programs: No programs available for hydroxychloroquine   Additional medication assistance options reviewed with patient as warranted:  Mail order programs, [employer] and [employer] currently closed.  I reviewed programs with spouse and recommended that she regularly check status as it may re-open depending on funding and in January.    -RX Outreach: $14 for up to 30 tablets / $30 for up to 90 tablets ('200mg'$ ).  Reviewed program with spouse who voiced understanding on how to apply in the future if desired.    Plan: I will email spouse [employer] program names for hydroxychloroquine medication assistance.   [facility] pharmacy case is being closed due to the following reasons:  Goals have been met.  Patient has been provided [facility] CM contact information if assistance needed in the future.    Thank you for allowing [facility]  pharmacy to be involved in this patient's care.    Ralene Bathe, PharmD, [place] [phone]

## 2018-08-03 ENCOUNTER — Other Ambulatory Visit: Payer: Self-pay | Admitting: *Deleted

## 2018-08-03 DIAGNOSIS — E1169 Type 2 diabetes mellitus with other specified complication: Secondary | ICD-10-CM | POA: Diagnosis not present

## 2018-08-03 NOTE — Patient Outreach (Signed)
[facility]) Care Management  08/03/2018  Tylen Leverich November 25, 1945 [id number]   Telephone Screen  Referral Date: 08/01/18 Referral Source: Nurse call center Referral Reason: double dose of Rx taken call poison center recommended  Insurance:HTA   Outreach attempt # 1 successful at the home number after 2nd call to the home, phone connectin difficulty x 1 Patient's wife is able to verify HIPAA Reviewed and addressed referral to [facility] with patient Mrs Bechard reports the poison control center was called and Mr Mayo's am medication was held until noon   Social: 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 He is independent/assist with iADLs. They confirm that transportation to medical is not a concern He reports if he is not able to drive Mrs Stauffer assists   Conditions: TIA, Seizures, Systemic lupus, HTN, memory loss, hyperlipidemia, depression, cardiac murmur, obstructive sleep apnea, DM (last A1c 6.7 on 07/01/18 , ED, enlarged prostate  DME: CPAP Falls There have been reported falls with minimum injury and his MDs aware and his neurologist has been evaluating him for this and memory issues  Medications:  [facility] pharmacy assisted with medication cost on 07/29/18  And today  They denies concerns with taking medications as prescribed, affording medications, side effects of medications and questions about medications   Appointments: Dr Tenny Craw was seen on 07/23/18 and on 07/27/18 Mr Silguero was seen for a new CPAP mask fitting Neurologist next appointment is in April 2020  Advance Directives: They have the forms for living will and POA but have not completed them  They want more times to review and discuss their wishes  CM encouraged a call to [facility] CM when they are ready for a referral to [facility]   Consent: [facility] RN CM reviewed [facility] services with patient. Patient gave verbal consent for  services.  Plan: [facility] RN CM will close case at this time as patient has been assessed and no needs identified.    Pt encouraged to return a call to [facility] RN CM prn  Kimberly L. Noelle Penner, RN, BSN, CCM [facility] Telephonic Care Management Care Coordinator Office number [phone] Mobile number [phone]  Main [facility] number [phone] Fax number [phone]

## 2018-08-10 ENCOUNTER — Ambulatory Visit: Payer: PPO | Admitting: Nurse Practitioner

## 2018-08-17 DIAGNOSIS — G4733 Obstructive sleep apnea (adult) (pediatric): Secondary | ICD-10-CM | POA: Diagnosis not present

## 2018-08-26 DIAGNOSIS — R351 Nocturia: Secondary | ICD-10-CM | POA: Diagnosis not present

## 2018-08-26 DIAGNOSIS — N401 Enlarged prostate with lower urinary tract symptoms: Secondary | ICD-10-CM | POA: Diagnosis not present

## 2018-08-26 DIAGNOSIS — R350 Frequency of micturition: Secondary | ICD-10-CM | POA: Diagnosis not present

## 2018-09-06 ENCOUNTER — Ambulatory Visit: Payer: Self-pay | Admitting: Diagnostic Neuroimaging

## 2018-09-06 ENCOUNTER — Telehealth: Payer: Self-pay | Admitting: Diagnostic Neuroimaging

## 2018-09-06 NOTE — Telephone Encounter (Signed)
Pts wife ([name] on [place]) requesting a call stating that medication levETIRAcetam (KEPPRA) 500 MG tablet has caused weakness and drossiness. Would like to know how long to expect these s/e

## 2018-09-06 NOTE — Telephone Encounter (Signed)
May reduce to levetiracetam 500mg  daily x 1-2 weeks for side effects to improve. Then increase to 500mg  twice a day. _VRP

## 2018-09-06 NOTE — Telephone Encounter (Signed)
Called wife, [name] [place] and advised she write down Dr Visteon CorporationPenumalli's instructions. She stated she got pen, paper. This RN then gave her DR Penumalli's instructions. Advised she can go back to Keppra twice daily after 1 week if his side effects have improved. Otherwise she can continue with once a day for a 2nd week.  She then stated that when he first began the new prescription he took an extra dose during the night. She called poison control and was advised to skip his am dose, which she did. She then took his medications so that she can administer them herself. She repeated Dr Richrd HumblesPenumalli's instructions correctly, verbalized understanding, appreciation of call back. This RN advised she call for any concerns or questions.

## 2018-09-16 DIAGNOSIS — G4733 Obstructive sleep apnea (adult) (pediatric): Secondary | ICD-10-CM | POA: Diagnosis not present

## 2018-10-17 DIAGNOSIS — G4733 Obstructive sleep apnea (adult) (pediatric): Secondary | ICD-10-CM | POA: Diagnosis not present

## 2018-11-08 DIAGNOSIS — G4733 Obstructive sleep apnea (adult) (pediatric): Secondary | ICD-10-CM | POA: Diagnosis not present

## 2018-11-17 DIAGNOSIS — G4733 Obstructive sleep apnea (adult) (pediatric): Secondary | ICD-10-CM | POA: Diagnosis not present

## 2018-11-21 DIAGNOSIS — N401 Enlarged prostate with lower urinary tract symptoms: Secondary | ICD-10-CM | POA: Diagnosis not present

## 2018-11-21 DIAGNOSIS — R351 Nocturia: Secondary | ICD-10-CM | POA: Diagnosis not present

## 2018-11-21 DIAGNOSIS — R350 Frequency of micturition: Secondary | ICD-10-CM | POA: Diagnosis not present

## 2018-12-07 ENCOUNTER — Encounter (HOSPITAL_COMMUNITY): Payer: Self-pay | Admitting: Emergency Medicine

## 2018-12-07 ENCOUNTER — Other Ambulatory Visit: Payer: Self-pay

## 2018-12-07 ENCOUNTER — Inpatient Hospital Stay (HOSPITAL_COMMUNITY)
Admission: EM | Admit: 2018-12-07 | Discharge: 2018-12-09 | DRG: 066 | Disposition: A | Discharge status: Home Health | Payer: PPO | Attending: Internal Medicine | Admitting: Internal Medicine

## 2018-12-07 ENCOUNTER — Emergency Department (HOSPITAL_COMMUNITY): Payer: PPO

## 2018-12-07 DIAGNOSIS — M329 Systemic lupus erythematosus, unspecified: Secondary | ICD-10-CM | POA: Diagnosis present

## 2018-12-07 DIAGNOSIS — R402142 Coma scale, eyes open, spontaneous, at arrival to emergency department: Secondary | ICD-10-CM | POA: Diagnosis present

## 2018-12-07 DIAGNOSIS — R29810 Facial weakness: Secondary | ICD-10-CM | POA: Diagnosis not present

## 2018-12-07 DIAGNOSIS — Z8249 Family history of ischemic heart disease and other diseases of the circulatory system: Secondary | ICD-10-CM | POA: Diagnosis not present

## 2018-12-07 DIAGNOSIS — I63511 Cerebral infarction due to unspecified occlusion or stenosis of right middle cerebral artery: Principal | ICD-10-CM | POA: Diagnosis present

## 2018-12-07 DIAGNOSIS — Z6834 Body mass index (BMI) 34.0-34.9, adult: Secondary | ICD-10-CM | POA: Diagnosis not present

## 2018-12-07 DIAGNOSIS — Z888 Allergy status to other drugs, medicaments and biological substances status: Secondary | ICD-10-CM | POA: Diagnosis not present

## 2018-12-07 DIAGNOSIS — I361 Nonrheumatic tricuspid (valve) insufficiency: Secondary | ICD-10-CM | POA: Diagnosis not present

## 2018-12-07 DIAGNOSIS — R2990 Unspecified symptoms and signs involving the nervous system: Secondary | ICD-10-CM

## 2018-12-07 DIAGNOSIS — Z7982 Long term (current) use of aspirin: Secondary | ICD-10-CM

## 2018-12-07 DIAGNOSIS — R402362 Coma scale, best motor response, obeys commands, at arrival to emergency department: Secondary | ICD-10-CM | POA: Diagnosis not present

## 2018-12-07 DIAGNOSIS — I10 Essential (primary) hypertension: Secondary | ICD-10-CM | POA: Diagnosis present

## 2018-12-07 DIAGNOSIS — I6389 Other cerebral infarction: Secondary | ICD-10-CM | POA: Diagnosis not present

## 2018-12-07 DIAGNOSIS — Z79899 Other long term (current) drug therapy: Secondary | ICD-10-CM | POA: Diagnosis not present

## 2018-12-07 DIAGNOSIS — R413 Other amnesia: Secondary | ICD-10-CM | POA: Diagnosis not present

## 2018-12-07 DIAGNOSIS — R4781 Slurred speech: Secondary | ICD-10-CM | POA: Diagnosis not present

## 2018-12-07 DIAGNOSIS — F0390 Unspecified dementia without behavioral disturbance: Secondary | ICD-10-CM | POA: Diagnosis not present

## 2018-12-07 DIAGNOSIS — R2689 Other abnormalities of gait and mobility: Secondary | ICD-10-CM | POA: Diagnosis not present

## 2018-12-07 DIAGNOSIS — G40909 Epilepsy, unspecified, not intractable, without status epilepticus: Secondary | ICD-10-CM | POA: Diagnosis present

## 2018-12-07 DIAGNOSIS — R29704 NIHSS score 4: Secondary | ICD-10-CM | POA: Diagnosis not present

## 2018-12-07 DIAGNOSIS — R402242 Coma scale, best verbal response, confused conversation, at arrival to emergency department: Secondary | ICD-10-CM | POA: Diagnosis present

## 2018-12-07 DIAGNOSIS — I639 Cerebral infarction, unspecified: Secondary | ICD-10-CM

## 2018-12-07 DIAGNOSIS — E1151 Type 2 diabetes mellitus with diabetic peripheral angiopathy without gangrene: Secondary | ICD-10-CM | POA: Diagnosis not present

## 2018-12-07 DIAGNOSIS — R531 Weakness: Secondary | ICD-10-CM | POA: Diagnosis not present

## 2018-12-07 DIAGNOSIS — E669 Obesity, unspecified: Secondary | ICD-10-CM | POA: Diagnosis present

## 2018-12-07 DIAGNOSIS — Z91013 Allergy to seafood: Secondary | ICD-10-CM

## 2018-12-07 DIAGNOSIS — G3184 Mild cognitive impairment, so stated: Secondary | ICD-10-CM | POA: Diagnosis present

## 2018-12-07 DIAGNOSIS — E785 Hyperlipidemia, unspecified: Secondary | ICD-10-CM | POA: Diagnosis present

## 2018-12-07 DIAGNOSIS — R569 Unspecified convulsions: Secondary | ICD-10-CM

## 2018-12-07 DIAGNOSIS — I340 Nonrheumatic mitral (valve) insufficiency: Secondary | ICD-10-CM | POA: Diagnosis not present

## 2018-12-07 DIAGNOSIS — R29818 Other symptoms and signs involving the nervous system: Secondary | ICD-10-CM | POA: Diagnosis not present

## 2018-12-07 DIAGNOSIS — I6523 Occlusion and stenosis of bilateral carotid arteries: Secondary | ICD-10-CM | POA: Diagnosis not present

## 2018-12-07 DIAGNOSIS — F03B0 Unspecified dementia, moderate, without behavioral disturbance, psychotic disturbance, mood disturbance, and anxiety: Secondary | ICD-10-CM | POA: Diagnosis present

## 2018-12-07 HISTORY — DX: Cerebral infarction, unspecified: I63.9

## 2018-12-07 LAB — RAPID URINE DRUG SCREEN, HOSP PERFORMED
Amphetamines: NOT DETECTED
Barbiturates: NOT DETECTED
Benzodiazepines: NOT DETECTED
Cocaine: NOT DETECTED
Opiates: NOT DETECTED
Tetrahydrocannabinol: NOT DETECTED

## 2018-12-07 LAB — COMPREHENSIVE METABOLIC PANEL
ALT: 19 U/L (ref 0–44)
AST: 23 U/L (ref 15–41)
Albumin: 4.2 g/dL (ref 3.5–5.0)
Alkaline Phosphatase: 55 U/L (ref 38–126)
Anion gap: 8 (ref 5–15)
BUN: 21 mg/dL (ref 8–23)
CO2: 24 mmol/L (ref 22–32)
Calcium: 9.2 mg/dL (ref 8.9–10.3)
Chloride: 105 mmol/L (ref 98–111)
Creatinine, Ser: 1.57 mg/dL — ABNORMAL HIGH (ref 0.61–1.24)
GFR calc Af Amer: 51 mL/min — ABNORMAL LOW (ref >60)
GFR calc non Af Amer: 44 mL/min — ABNORMAL LOW (ref >60)
Glucose, Bld: 173 mg/dL — ABNORMAL HIGH (ref 70–99)
Potassium: 4.5 mmol/L (ref 3.5–5.1)
Sodium: 137 mmol/L (ref 135–145)
Total Bilirubin: 0.9 mg/dL (ref 0.3–1.2)
Total Protein: 8.1 g/dL (ref 6.5–8.1)

## 2018-12-07 LAB — URINALYSIS, ROUTINE W REFLEX MICROSCOPIC
Bilirubin Urine: NEGATIVE
Glucose, UA: NEGATIVE mg/dL
Hgb urine dipstick: NEGATIVE
Ketones, ur: NEGATIVE mg/dL
Leukocytes,Ua: NEGATIVE
NITRITE: NEGATIVE
Protein, ur: NEGATIVE mg/dL
Specific Gravity, Urine: 1.02 (ref 1.005–1.030)
pH: 7 (ref 5.0–8.0)

## 2018-12-07 LAB — CBC
HCT: 38.1 % — ABNORMAL LOW (ref 39.0–52.0)
Hemoglobin: 12.3 g/dL — ABNORMAL LOW (ref 13.0–17.0)
MCH: 31.1 pg (ref 26.0–34.0)
MCHC: 32.3 g/dL (ref 30.0–36.0)
MCV: 96.5 fL (ref 80.0–100.0)
Platelets: 129 10*3/uL — ABNORMAL LOW (ref 150–400)
RBC: 3.95 MIL/uL — ABNORMAL LOW (ref 4.22–5.81)
RDW: 11.2 % — ABNORMAL LOW (ref 11.5–15.5)
WBC: 3.6 10*3/uL — ABNORMAL LOW (ref 4.0–10.5)
nRBC: 0 % (ref 0.0–0.2)

## 2018-12-07 LAB — DIFFERENTIAL
Abs Immature Granulocytes: 0 10*3/uL (ref 0.00–0.07)
Basophils Absolute: 0 10*3/uL (ref 0.0–0.1)
Basophils Relative: 1 %
Eosinophils Absolute: 0 10*3/uL (ref 0.0–0.5)
Eosinophils Relative: 1 %
Immature Granulocytes: 0 %
Lymphocytes Relative: 39 %
Lymphs Abs: 1.4 10*3/uL (ref 0.7–4.0)
Monocytes Absolute: 0.4 10*3/uL (ref 0.1–1.0)
Monocytes Relative: 12 %
NEUTROS PCT: 47 %
Neutro Abs: 1.7 10*3/uL (ref 1.7–7.7)

## 2018-12-07 LAB — ETHANOL: Alcohol, Ethyl (B): <10 mg/dL (ref <10)

## 2018-12-07 LAB — PROTIME-INR
INR: 1.1 (ref 0.8–1.2)
Prothrombin Time: 14.3 seconds (ref 11.4–15.2)

## 2018-12-07 LAB — APTT: aPTT: 49 seconds — ABNORMAL HIGH (ref 24–36)

## 2018-12-07 LAB — I-STAT CREATININE, ED: Creatinine, Ser: 1.6 mg/dL — ABNORMAL HIGH (ref 0.61–1.24)

## 2018-12-07 LAB — CBG MONITORING, ED: Glucose-Capillary: 144 mg/dL — ABNORMAL HIGH (ref 70–99)

## 2018-12-07 MED ORDER — HYDROXYCHLOROQUINE SULFATE 200 MG PO TABS
200.0000 mg | ORAL_TABLET | Freq: Two times a day (BID) | ORAL | Status: DC
  Administered 2018-12-07 – 2018-12-09 (×4): 200 mg via ORAL
  Filled 2018-12-07 (×4): qty 1

## 2018-12-07 MED ORDER — POTASSIUM CHLORIDE CRYS ER 20 MEQ PO TBCR
20.0000 meq | EXTENDED_RELEASE_TABLET | Freq: Every day | ORAL | Status: DC
  Administered 2018-12-08 – 2018-12-09 (×2): 20 meq via ORAL
  Filled 2018-12-07 (×2): qty 1

## 2018-12-07 MED ORDER — LEVETIRACETAM 500 MG PO TABS
500.0000 mg | ORAL_TABLET | Freq: Two times a day (BID) | ORAL | Status: DC
  Administered 2018-12-07 – 2018-12-09 (×4): 500 mg via ORAL
  Filled 2018-12-07 (×4): qty 1

## 2018-12-07 MED ORDER — ASPIRIN EC 81 MG PO TBEC
81.0000 mg | DELAYED_RELEASE_TABLET | Freq: Every day | ORAL | Status: DC
  Administered 2018-12-08 – 2018-12-09 (×2): 81 mg via ORAL
  Filled 2018-12-07 (×2): qty 1

## 2018-12-07 MED ORDER — ACETAMINOPHEN 500 MG PO TABS
500.0000 mg | ORAL_TABLET | Freq: Four times a day (QID) | ORAL | Status: DC | PRN
  Administered 2018-12-07: 500 mg via ORAL
  Filled 2018-12-07: qty 1

## 2018-12-07 MED ORDER — SIMVASTATIN 20 MG PO TABS
20.0000 mg | ORAL_TABLET | Freq: Every day | ORAL | Status: DC
  Administered 2018-12-07 – 2018-12-08 (×2): 20 mg via ORAL
  Filled 2018-12-07 (×2): qty 1

## 2018-12-07 MED ORDER — STROKE: EARLY STAGES OF RECOVERY BOOK
Freq: Once | Status: AC
  Administered 2018-12-07: 22:00:00

## 2018-12-07 MED ORDER — IOHEXOL 350 MG/ML SOLN
75.0000 mL | Freq: Once | INTRAVENOUS | Status: AC | PRN
  Administered 2018-12-07: 75 mL via INTRAVENOUS

## 2018-12-07 MED ORDER — ENOXAPARIN SODIUM 60 MG/0.6ML ~~LOC~~ SOLN
50.0000 mg | SUBCUTANEOUS | Status: DC
  Administered 2018-12-07 – 2018-12-08 (×2): 50 mg via SUBCUTANEOUS
  Filled 2018-12-07 (×2): qty 0.5

## 2018-12-07 MED ORDER — POLYVINYL ALCOHOL 1.4 % OP SOLN: 1.0000 [drp] | Freq: Every day | OPHTHALMIC | Status: DC | PRN

## 2018-12-07 MED ORDER — CYCLOBENZAPRINE HCL 10 MG PO TABS
5.0000 mg | ORAL_TABLET | Freq: Once | ORAL | Status: AC
  Administered 2018-12-07: 5 mg via ORAL
  Filled 2018-12-07: qty 1

## 2018-12-07 MED ORDER — VITAMIN B-12 1000 MCG PO TABS
1000.0000 ug | ORAL_TABLET | Freq: Every day | ORAL | Status: DC
  Administered 2018-12-07 – 2018-12-09 (×3): 1000 ug via ORAL
  Filled 2018-12-07 (×3): qty 1

## 2018-12-07 MED ORDER — TAMSULOSIN HCL 0.4 MG PO CAPS
0.4000 mg | ORAL_CAPSULE | Freq: Every day | ORAL | Status: DC
  Administered 2018-12-08 – 2018-12-09 (×2): 0.4 mg via ORAL
  Filled 2018-12-07 (×2): qty 1

## 2018-12-07 NOTE — ED Notes (Addendum)
Patient transported to MRI 

## 2018-12-07 NOTE — Code Documentation (Signed)
73 yo male coming from a 54 office with complaints of new onset of left facial droop. Pt did not sleep well last night, but woke up and was acting his baseline per family. Upon arrival to the doctor's office at 1030, the patient was noted to have a new onset of left facial droop. Pt sent over to the ED by private vehicle to be evaluated for stroke. Triage activated a Code Stroke.Stroke Team met patient in CT. Initial NIHSS 4 due to inability to answer questions or name pictures per baseline of dementia along with a very slight left facial droop. Family reports natural slight droop to the left at baseline. CT Head showed no sign of hemorrhage. CTA completed with no signs of LVO. Pt is not a tPA candidate due to being too mild to treat. Patient will remain in the tPA window until 1500. MRI ordered to confirm stroke vs. Seizure. Handoff given to Percy, Therapist, sports.

## 2018-12-07 NOTE — Consult Note (Addendum)
Neurology Consultation  Reason for Consult: Acute code stroke for right-sided weakness, facial droop Referring Physician: Dr. Azalia Bilis, ED provider  CC: Weakness, facial droop  History is obtained from: Patient, wife, chart  HPI: David Irwin is a 73 y.o. male with past medical history of significant memory deficit, hypertension, lupus, diabetes, who was in his usual state of health till he was taken to his doctor this morning around 10:30 AM which is his last known normal, when he was noted to have sudden onset of left facial droop.  There is some confusion regarding as to what side was a week on but he was brought in as a code stroke for unilateral weakness and facial droop.  He was brought in via personal vehicle at triage and a code stroke was activated. Initial evaluation was nonfocal but he had a NIH stroke scale of 4 due to inability to answer some questions along with inability to name all the pictures on the NIH stroke card and mild left facial droop. Later on family reported that the left facial droop is baseline as he has some facial asymmetry. The wife denies any preceding sicknesses or illnesses, fevers or chills.  There has been no sick contacts.  No fevers chills.  No chest pain shortness of breath.  No nausea vomiting.  No frequent urination diarrhea constipation. He does not have any excessive bleeding or bruising tendencies at this time. He is a patient of [facility], Dr. Marjory Lies, who has been following him for his dementia.  He is also been recently diagnosed with seizures.  His seizures have not been tonic-clonic and he goes into staring spells which have been presumed to be seizures related to his dementia.  He is on antiepileptics for that.  No witnessed staring spells or tonic-clonic activity at this time.  No incontinence.  No tongue bite. He also has a history of dementia documented in his chart.  LKW: 10:30 AM on 12/07/2018 tpa given?: no, nonfocal  symptoms Premorbid modified Rankin scale (mRS): 2  ROS: ROS was performed and is negative except as noted in the HPI.  Past Medical History:  Diagnosis Date  . Diabetes mellitus without complication (HCC)   . Hypertension   . Lupus (HCC)   . Memory loss   . Murmur, cardiac   . Nocturia      Family History  Problem Relation Age of Onset  . Kidney disease Mother   . Dementia Father   Dementia in father, hypertension renal disease in mother.   Social History:   reports that he has never smoked. He has never used smokeless tobacco. He reports that he does not drink alcohol or use drugs. No tobacco alcohol or illicit drug use  Medications No current facility-administered medications for this encounter.   Current Outpatient Medications:  .  acetaminophen (TYLENOL) 500 MG tablet, Take 500 mg by mouth every 6 (six) hours as needed for headache (pain)., Disp: , Rfl:  .  aspirin EC 81 MG tablet, Take 81 mg by mouth daily., Disp: , Rfl:  .  clobetasol cream (TEMOVATE) 0.05 %, Apply 1 application topically as needed (break out lupus reaction on scalp)., Disp: , Rfl:  .  fluticasone (FLONASE) 50 MCG/ACT nasal spray, Place 2 sprays into both nostrils at bedtime as needed for allergies or rhinitis. , Disp: , Rfl: 5 .  hydroxychloroquine (PLAQUENIL) 200 MG tablet, Take 200 mg by mouth 2 (two) times daily. , Disp: , Rfl:  .  levETIRAcetam (KEPPRA)  500 MG tablet, Take 1 tablet (500 mg total) by mouth 2 (two) times daily., Disp: 60 tablet, Rfl: 12 .  Omega-3 Fatty Acids (OMEGA 3 PO), Take 1 capsule by mouth daily., Disp: , Rfl:  .  polyvinyl alcohol (ARTIFICIAL TEARS) 1.4 % ophthalmic solution, Place 1 drop into both eyes daily as needed for dry eyes., Disp: , Rfl:  .  potassium chloride SA (K-DUR,KLOR-CON) 20 MEQ tablet, Take 20 mEq by mouth daily. , Disp: , Rfl:  .  PRESCRIPTION MEDICATION, Inhale into the lungs at bedtime. CPAP, Disp: , Rfl:  .  simvastatin (ZOCOR) 20 MG tablet, Take 20 mg by  mouth at bedtime. , Disp: , Rfl:  .  tamsulosin (FLOMAX) 0.4 MG CAPS, Take 0.4 mg by mouth daily. , Disp: , Rfl:  .  telmisartan-hydrochlorothiazide (MICARDIS HCT) 40-12.5 MG tablet, Take 2 tablets by mouth daily., Disp: , Rfl: 4 .  vitamin B-12 (CYANOCOBALAMIN) 1000 MCG tablet, Take 1,000 mcg by mouth daily., Disp: , Rfl:    Exam: Current vital signs: BP (!) 150/73   Pulse 61   Temp 97.6 F (36.4 C) (Oral)   Resp 16   SpO2 97%  Vital signs in last 24 hours: Temp:  [97.6 F (36.4 C)] 97.6 F (36.4 C) (03/04 1257) Pulse Rate:  [61] 61 (03/04 1257) Resp:  [16] 16 (03/04 1257) BP: (150)/(73) 150/73 (03/04 1257) SpO2:  [97 %] 97 % (03/04 1257)  David: Awake alert, bradyphrenic HEENT: Normocephalic atraumatic CVs: Respiratory regular rhythm Respiratory: Chest clear to auscultation, chest elevation symmetric Extremities: Warm well perfused with no edema Abdomen: Obese, nontender Neurological exam He is awake alert oriented to self, and the fact that he is in the hospital.  Could not tell me his correct age. His speech is not dysarthric. He was able to name simple objects without any problems but had difficulty naming all the objects on the NIH stroke card. Cranial nerves: Pupils equal round react light, extraocular movements intact, visual fields full, face is grossly symmetric with mild asymmetry- very mild flattening of the left nasolabial fold, facial sensation intact, palate midline, tongue midline. Motor exam: Symmetric 5/5 strength in all extremities with poor effort on grip strength bilaterally. Lower extremities are antigravity and normal tone and range of motion. Sensory exam: Intact light touch all over without extinction Coordination: Intact finger-nose-finger-slow to perform Gait testing was deferred at this point NIHSS4 possibly due to baseline deficits.   Labs I have reviewed labs in epic and the results pertinent to this consultation are:  CBC    Component  Value Date/Time   WBC 3.6 (L) 12/07/2018 1215   RBC 3.95 (L) 12/07/2018 1215   HGB 12.3 (L) 12/07/2018 1215   HCT 38.1 (L) 12/07/2018 1215   PLT 129 (L) 12/07/2018 1215   MCV 96.5 12/07/2018 1215   MCH 31.1 12/07/2018 1215   MCHC 32.3 12/07/2018 1215   RDW 11.2 (L) 12/07/2018 1215   LYMPHSABS 1.4 12/07/2018 1215   MONOABS 0.4 12/07/2018 1215   EOSABS 0.0 12/07/2018 1215   BASOSABS 0.0 12/07/2018 1215    CMP     Component Value Date/Time   NA 138 07/25/2018 1822   K 4.8 07/25/2018 1822   CL 104 07/25/2018 1822   CO2 24 07/25/2018 1806   GLUCOSE 187 (H) 07/25/2018 1822   BUN 24 (H) 07/25/2018 1822   CREATININE 1.60 (H) 12/07/2018 1218   CALCIUM 8.9 07/25/2018 1806   PROT 7.3 07/25/2018 1806   ALBUMIN 4.0  07/25/2018 1806   AST 25 07/25/2018 1806   ALT 18 07/25/2018 1806   ALKPHOS 45 07/25/2018 1806   BILITOT 0.7 07/25/2018 1806   GFRNONAA 45 (L) 07/25/2018 1806   GFRAA 52 (L) 07/25/2018 1806   Imaging I have reviewed the images obtained: CT-scan of the brain: No acute changes.  Aspects 10. CTA head and neck: No acute changes.  Moderate right ICA stenosis of the siphon but no intracranial occlusion or stenosis.  Assessment: 73 year old man past medical history of significant memory deficits, possible seizures, hypertension, lupus, diabetes, depression, brought in for concern for strokelike symptoms with last known normal 10:30 AM. Minimal deficits on examination related to possible underlying dementia and very nonfocal hence no treatment with TPA. No evidence of cortical signs on exam or CTA to suggest emergent LVO- hence not a candidate for EVT. Other differentials could include possible behavioral disturbance with dementia versus possible subclinical seizure with postictal lethargy.  Impression: Evaluate for possible seizure Evaluate for possible stroke Evaluate for possible worsening of dementia with behavioral components.  Recommendations: MRI brain without  contrast-pursue stroke work-up only if it shows a stroke. I would recommend patient be admitted for observation and get an EEG to rule out ongoing electrographic disturbance. Continue home Keppra 500 twice daily for now. Maintain seizure precautions Check urinalysis chest x-ray and labs for any evidence of infection. We will follow with you after the studies become available.  -- Milon Dikes, MD [facility] Pager: [phone] If 7pm to 7am, please call on call as listed on AMION.  Addendum Brain MRI shows a right thalamic and left cortical stroke. Should be admitted for stroke work-up Recommend the following in addition to above: - Admit to hospitalist -Telemetry -2D echo -A1c LDL -Frequent neurochecks -PT, OT, speech therapy - Aspirin 325, atorvastatin 80 - Might need long-term cardiac monitoring.  Will defer decision to stroke team after rounding.  -- Milon Dikes, MD [facility] Pager: [phone] If 7pm to 7am, please call on call as listed on AMION.

## 2018-12-07 NOTE — ED Triage Notes (Signed)
Pt BIB POV for arm pain. Upon arrival pt was evaluated by Dr. Donnald Garre and a code stroke was called. Pt originally went to his PCP for the left arm pain when his PCP and family noticed the pt has slurred speech and left sided facial droop. Pt's wife advised he was LSN at 1030 this date. Pt's wife advised he was normal when they left the house and his appointment was at 1045 this date. Pt's wife reports the pt was extremely restless last night which is unusual for him as he normally sleeps extremely well. Wife advised she has not noticed any weaknesses on either side of his body.

## 2018-12-07 NOTE — ED Provider Notes (Signed)
MSE was initiated and I personally evaluated the patient and placed orders (if any) at  12:12 PM on December 07, 2018.  The patient appears stable so that the remainder of the MSE may be completed by another provider.  Patient was going to PCP office for a scheduled visit for left arm pains been present for 2 weeks.  Patient's wife reports that when they got to the office he was having difficulty walking and getting out of the car.  He seemed slowed and delayed.  She and the nurse noted slurred speech and difficulty talking.  This occurred at 10:30 AM today.  Patient's wife reports that they were up together in the morning and he was normal first thing in the morning.  Patient is awake and alert.  He does not have any respiratory distress.  He does have evident right mouth droop which the patient's wife identifies is new.  He is slow to generate speech.  He can answer questions appropriately.  No airway impingement.  No respiratory distress.  Patient is not endorsing visual field cut. Patient can hold both hands out in extension without gross drift.  Will call code stroke based on new onset of facial droop and speech deficit at 10:30 AM.   Arby Barrette, MD 12/07/18 1213

## 2018-12-07 NOTE — ED Notes (Signed)
Pt returned from MRI °

## 2018-12-07 NOTE — ED Notes (Signed)
Pt not available for Q61min neuro check and vitals. Pt currently in MRI scanner.

## 2018-12-07 NOTE — H&P (Signed)
History and Physical    David BoozeGeorge Mackins NFA:[id number] DOB: 06/24/1946 DOA: 12/07/2018  PCP: Daisy Florooss, Charles Alan, MD  Patient coming from: home. Now from PCP office   I have personally briefly reviewed patient's old medical records available.   Chief Complaint: Slurring of speech and loss of balance.  HPI: David Irwin is a 73 y.o. male with medical history significant of hypertension, diabetes not on treatment, lupus, early dementia with dementia related seizure disorder who is presenting to the emergency room from primary care physician office where he was found to have possible stroke.  Patient is poor historian.  Patient is accompanied by patient's wife and their daughter.  They noted that he did not sleep well last night and he woke up in the morning with slurring of his speech as well as he was stumbling around when getting ready to go to the doctor's office.  They went to doctor's office, they explained the symptoms, patient was also noted to have possible left facial droop so he was sent to the ER.  Patient himself currently denies any complaints to me.  No recent fever chills.  No seizure-like episodes.  Patient has some pain on his left shoulder, denies any weakness.  He is speaking in full sentences now.  Denies any visual changes. ED Course: Hemodynamically stable.  Subtle left facial droop noted but no other neurological deficit.  Code stroke was called.  Patient with minimum or no neurological deficit, he was not a TPA candidate.  CT head and CT angiogram of the head and neck with no acute findings.  It does show chronic intracranial stenosis.  An MRI of the brain showed acute right thalamic stroke.  Seen by neurology in the emergency room.  Review of Systems: As per HPI otherwise 10 point review of systems negative.    Past Medical History:  Diagnosis Date  . Diabetes mellitus without complication (HCC)   . Hypertension   . Lupus (HCC)   . Memory loss   . Murmur, cardiac   .  Nocturia     Past Surgical History:  Procedure Laterality Date  . APPENDECTOMY    . EYE SURGERY       reports that he has never smoked. He has never used smokeless tobacco. He reports that he does not drink alcohol or use drugs.  Allergies  Allergen Reactions  . Liraglutide Other (See Comments)    hallucinations   . Shellfish Allergy Hives    Family History  Problem Relation Age of Onset  . Kidney disease Mother   . Dementia Father      Prior to Admission medications   Medication Sig Start Date End Date Taking? Authorizing Provider  acetaminophen (TYLENOL) 500 MG tablet Take 500 mg by mouth every 6 (six) hours as needed for headache (pain).    [provider]  aspirin EC 81 MG tablet Take 81 mg by mouth daily.    [provider]  clobetasol cream (TEMOVATE) 0.05 % Apply 1 application topically as needed (break out lupus reaction on scalp).    [provider]  fluticasone (FLONASE) 50 MCG/ACT nasal spray Place 2 sprays into both nostrils at bedtime as needed for allergies or rhinitis.  07/06/18   [provider]  hydroxychloroquine (PLAQUENIL) 200 MG tablet Take 200 mg by mouth 2 (two) times daily.     [provider]  levETIRAcetam (KEPPRA) 500 MG tablet Take 1 tablet (500 mg total) by mouth 2 (two) times daily. 07/27/18  Penumalli, Glenford Bayley, MD  Omega-3 Fatty Acids (OMEGA 3 PO) Take 1 capsule by mouth daily.    [provider]  polyvinyl alcohol (ARTIFICIAL TEARS) 1.4 % ophthalmic solution Place 1 drop into both eyes daily as needed for dry eyes.    [provider]  potassium chloride SA (K-DUR,KLOR-CON) 20 MEQ tablet Take 20 mEq by mouth daily.     [provider]  PRESCRIPTION MEDICATION Inhale into the lungs at bedtime. CPAP    [provider]  simvastatin (ZOCOR) 20 MG tablet Take 20 mg by mouth at bedtime.     [provider]  tamsulosin (FLOMAX) 0.4 MG CAPS Take 0.4 mg by mouth  daily.  04/27/13   [provider]  telmisartan-hydrochlorothiazide (MICARDIS HCT) 40-12.5 MG tablet Take 2 tablets by mouth daily. 07/08/18   [provider]  vitamin B-12 (CYANOCOBALAMIN) 1000 MCG tablet Take 1,000 mcg by mouth daily.    [provider]    Physical Exam: Vitals:   12/07/18 1300 12/07/18 1315 12/07/18 1330 12/07/18 1345  BP: (!) 153/88 (!) 153/84  (!) 152/79  Pulse: 63 62 81 77  Resp: (!) 21 13 (!) 23 18  Temp:      TempSrc:      SpO2: 97% 97% 96% 95%  Weight:      Height:        Constitutional: NAD, calm, comfortable Vitals:   12/07/18 1300 12/07/18 1315 12/07/18 1330 12/07/18 1345  BP: (!) 153/88 (!) 153/84  (!) 152/79  Pulse: 63 62 81 77  Resp: (!) 21 13 (!) 23 18  Temp:      TempSrc:      SpO2: 97% 97% 96% 95%  Weight:      Height:       Eyes: PERRL, lids and conjunctivae normal ENMT: Mucous membranes are moist. Posterior pharynx clear of any exudate or lesions.Normal dentition.  Neck: normal, supple, no masses, no thyromegaly Respiratory: clear to auscultation bilaterally, no wheezing, no crackles. Normal respiratory effort. No accessory muscle use.  Cardiovascular: Regular rate and rhythm, no murmurs / rubs / gallops. No extremity edema. 2+ pedal pulses. No carotid bruits.  Abdomen: no tenderness, no masses palpated. No hepatosplenomegaly. Bowel sounds positive.  Musculoskeletal: no clubbing / cyanosis. No joint deformity upper and lower extremities. Good ROM, no contractures. Normal muscle tone.  Skin: no rashes, lesions, ulcers. No induration Neurologic: CN 2-12 grossly intact. Sensation intact, DTR normal. Strength 5/5 in all 4.  Psychiatric: Normal judgment and insight. Alert and oriented x 3. Normal mood.  Patient can mostly answer all orientation questions.    Labs on Admission: I have personally reviewed following labs and imaging studies  CBC: Recent Labs  Lab 12/07/18 1215  WBC 3.6*  NEUTROABS 1.7  HGB 12.3*   HCT 38.1*  MCV 96.5  PLT 129*   Basic Metabolic Panel: Recent Labs  Lab 12/07/18 1215 12/07/18 1218  NA 137  --   K 4.5  --   CL 105  --   CO2 24  --   GLUCOSE 173*  --   BUN 21  --   CREATININE 1.57* 1.60*  CALCIUM 9.2  --    GFR: Estimated Creatinine Clearance: 52.2 mL/min (A) (by C-G formula based on SCr of 1.6 mg/dL (H)). Liver Function Tests: Recent Labs  Lab 12/07/18 1215  AST 23  ALT 19  ALKPHOS 55  BILITOT 0.9  PROT 8.1  ALBUMIN 4.2   No results for input(s):  LIPASE, AMYLASE in the last 168 hours. No results for input(s): AMMONIA in the last 168 hours. Coagulation Profile: Recent Labs  Lab 12/07/18 1215  INR 1.1   Cardiac Enzymes: No results for input(s): CKTOTAL, CKMB, CKMBINDEX, TROPONINI in the last 168 hours. BNP (last 3 results) No results for input(s): PROBNP in the last 8760 hours. HbA1C: No results for input(s): HGBA1C in the last 72 hours. CBG: Recent Labs  Lab 12/07/18 1251  GLUCAP 144*   Lipid Profile: No results for input(s): CHOL, HDL, LDLCALC, TRIG, CHOLHDL, LDLDIRECT in the last 72 hours. Thyroid Function Tests: No results for input(s): TSH, T4TOTAL, FREET4, T3FREE, THYROIDAB in the last 72 hours. Anemia Panel: No results for input(s): VITAMINB12, FOLATE, FERRITIN, TIBC, IRON, RETICCTPCT in the last 72 hours. Urine analysis:    Component Value Date/Time   COLORURINE STRAW (A) 12/07/2018 1335   APPEARANCEUR CLEAR 12/07/2018 1335   LABSPEC 1.020 12/07/2018 1335   PHURINE 7.0 12/07/2018 1335   GLUCOSEU NEGATIVE 12/07/2018 1335   HGBUR NEGATIVE 12/07/2018 1335   BILIRUBINUR NEGATIVE 12/07/2018 1335   KETONESUR NEGATIVE 12/07/2018 1335   PROTEINUR NEGATIVE 12/07/2018 1335   NITRITE NEGATIVE 12/07/2018 1335   LEUKOCYTESUR NEGATIVE 12/07/2018 1335    Radiological Exams on Admission: Ct Angio Head W Or Wo Contrast  Result Date: 12/07/2018 CLINICAL DATA:  73 year old male code stroke. Left side weakness and facial droop last  seen normal at 1030 hours. EXAM: CT ANGIOGRAPHY HEAD AND NECK TECHNIQUE: Multidetector CT imaging of the head and neck was performed using the standard protocol during bolus administration of intravenous contrast. Multiplanar CT image reconstructions and MIPs were obtained to evaluate the vascular anatomy. Carotid stenosis measurements (when applicable) are obtained utilizing NASCET criteria, using the distal internal carotid diameter as the denominator. CONTRAST:  75mL OMNIPAQUE IOHEXOL 350 MG/ML SOLN COMPARISON:  Head CT without contrast 1221 hours today. Brain MRI and intracranial MRA 08/18/2014. FINDINGS: CTA NECK Skeleton: Negative. Absent dentition. Upper chest: Mild atelectasis in the upper lungs. No superior mediastinal lymphadenopathy. Other neck: Negative. Aortic arch: Aberrant origin of the right subclavian artery. Mild aortic arch calcified plaque. Right carotid system: Minimal plaque in the right CCA proximal to the bifurcation. Soft and calcified plaque at the right bifurcation and ICA origin without stenosis. Negative cervical right ICA. Left carotid system: Left CCA origin mildly obscured by dense left subclavian vein contrast. Mildly tortuous proximal left CCA. Mild calcified plaque proximal to the bifurcation without stenosis. Bulky calcified plaque at the left ICA origin resulting in 50 % stenosis with respect to the distal vessel. Negative cervical left ICA otherwise. Vertebral arteries: Aberrant origin of the right subclavian artery with calcified plaque but no stenosis. The right vertebral artery is non dominant and seems to arise from the right CCA rather than the right subclavian on series 7, image 278. It has a late entry into the cervical transverse foramen and remains diminutive but patent to the skull base. No proximal left subclavian artery stenosis. Minimal calcified plaque at the. Tortuous left V1 left vertebral artery origin without stenosis segment. The left vertebral is dominant and  patent to the skull base without stenosis. CTA HEAD Posterior circulation: Diminutive non dominant right vertebral artery remains patent to the vertebrobasilar junction. The left vertebral artery is dominant and supplies the basilar with a normal left PICA origin. Patent basilar artery without stenosis. Normal SCA and left PCA origins. Fetal type right PCA origin. The left posterior communicating artery is also present. Mild bilateral PCA tortuosity.  Visible PCA branches appear within normal limits. Anterior circulation: Patent ICA siphons. The left siphon is calcified through the cavernous and supraclinoid segments with mild stenosis. The right siphon demonstrates similar extensive calcification, but with moderate to severe supraclinoid segment stenosis as seen on series 7, image 99 and series 9, image 84. Normal posterior communicating artery origins. Patent carotid termini. Normal MCA and ACA origins. Diminutive or absent anterior communicating artery. Bilateral ACA branches are within normal limits. The left MCA M1 segment is tortuous. The left MCA bifurcation and left MCA branches are within normal limits. The right MCA M1 segment and bifurcation are patent without stenosis. Right MCA branches are within normal limits. Venous sinuses: Early contrast timing limits evaluation. Anatomic variants: Aberrant right subclavian artery origin. Dominant left vertebral artery. Non dominant and diminutive right vertebral artery which seems to arise from the right CCA rather than the subclavian, and remains diminutive to the vertebrobasilar junction. Fetal type right PCA origin. Review of the MIP images confirms the above findings IMPRESSION: 1. Negative for large vessel occlusion. 2. Positive for moderate to severe Right ICA supraclinoid segment stenosis due to calcified plaque. Left ICA siphon and bilateral carotid bifurcation atherosclerosis without significant stenosis. 3. Aberrant origin of the right subclavian artery  and dominant left vertebral artery which supplies the basilar. The non dominant right vertebral artery is diminutive and appears to arise from the right CCA rather than the right subclavian. Salient findings communicated to Dr. Laurence Slate at 1:04 pmon 3/4/2020by text page via the [facility] messaging system. Electronically Signed   By: Odessa Fleming M.D.   On: 12/07/2018 13:06   Ct Angio Neck W Or Wo Contrast  Result Date: 12/07/2018 CLINICAL DATA:  73 year old male code stroke. Left side weakness and facial droop last seen normal at 1030 hours. EXAM: CT ANGIOGRAPHY HEAD AND NECK TECHNIQUE: Multidetector CT imaging of the head and neck was performed using the standard protocol during bolus administration of intravenous contrast. Multiplanar CT image reconstructions and MIPs were obtained to evaluate the vascular anatomy. Carotid stenosis measurements (when applicable) are obtained utilizing NASCET criteria, using the distal internal carotid diameter as the denominator. CONTRAST:  53mL OMNIPAQUE IOHEXOL 350 MG/ML SOLN COMPARISON:  Head CT without contrast 1221 hours today. Brain MRI and intracranial MRA 08/18/2014. FINDINGS: CTA NECK Skeleton: Negative. Absent dentition. Upper chest: Mild atelectasis in the upper lungs. No superior mediastinal lymphadenopathy. Other neck: Negative. Aortic arch: Aberrant origin of the right subclavian artery. Mild aortic arch calcified plaque. Right carotid system: Minimal plaque in the right CCA proximal to the bifurcation. Soft and calcified plaque at the right bifurcation and ICA origin without stenosis. Negative cervical right ICA. Left carotid system: Left CCA origin mildly obscured by dense left subclavian vein contrast. Mildly tortuous proximal left CCA. Mild calcified plaque proximal to the bifurcation without stenosis. Bulky calcified plaque at the left ICA origin resulting in 50 % stenosis with respect to the distal vessel. Negative cervical left ICA otherwise. Vertebral arteries:  Aberrant origin of the right subclavian artery with calcified plaque but no stenosis. The right vertebral artery is non dominant and seems to arise from the right CCA rather than the right subclavian on series 7, image 278. It has a late entry into the cervical transverse foramen and remains diminutive but patent to the skull base. No proximal left subclavian artery stenosis. Minimal calcified plaque at the. Tortuous left V1 left vertebral artery origin without stenosis segment. The left vertebral is dominant and patent to the skull  base without stenosis. CTA HEAD Posterior circulation: Diminutive non dominant right vertebral artery remains patent to the vertebrobasilar junction. The left vertebral artery is dominant and supplies the basilar with a normal left PICA origin. Patent basilar artery without stenosis. Normal SCA and left PCA origins. Fetal type right PCA origin. The left posterior communicating artery is also present. Mild bilateral PCA tortuosity. Visible PCA branches appear within normal limits. Anterior circulation: Patent ICA siphons. The left siphon is calcified through the cavernous and supraclinoid segments with mild stenosis. The right siphon demonstrates similar extensive calcification, but with moderate to severe supraclinoid segment stenosis as seen on series 7, image 99 and series 9, image 84. Normal posterior communicating artery origins. Patent carotid termini. Normal MCA and ACA origins. Diminutive or absent anterior communicating artery. Bilateral ACA branches are within normal limits. The left MCA M1 segment is tortuous. The left MCA bifurcation and left MCA branches are within normal limits. The right MCA M1 segment and bifurcation are patent without stenosis. Right MCA branches are within normal limits. Venous sinuses: Early contrast timing limits evaluation. Anatomic variants: Aberrant right subclavian artery origin. Dominant left vertebral artery. Non dominant and diminutive right  vertebral artery which seems to arise from the right CCA rather than the subclavian, and remains diminutive to the vertebrobasilar junction. Fetal type right PCA origin. Review of the MIP images confirms the above findings IMPRESSION: 1. Negative for large vessel occlusion. 2. Positive for moderate to severe Right ICA supraclinoid segment stenosis due to calcified plaque. Left ICA siphon and bilateral carotid bifurcation atherosclerosis without significant stenosis. 3. 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 The non dominant right vertebral artery is diminutive and appears to arise from the right CCA rather than the right subclavian. Salient findings communicated to Dr. Laurence Slate at 1:04 pmon 3/4/2020by text page via the [facility] messaging system. Electronically Signed   By: Odessa Fleming M.D.   On: 12/07/2018 13:06   Mr Brain Wo Contrast  Result Date: 12/07/2018 CLINICAL DATA:  73 y/o  M; left-sided weakness and facial droop. EXAM: MRI HEAD WITHOUT CONTRAST TECHNIQUE: Multiplanar, multiecho pulse sequences of the brain and surrounding structures were obtained without intravenous contrast. COMPARISON:  12/07/2018 CT head and CTA head.  08/18/2014 MRI head. FINDINGS: Brain: Motion degradation of multiple pulse sequences. 11 mm focus of reduced diffusion within the right lateral thalamus compatible with acute/early subacute infarction. No associated hemorrhage or mass effect. Probable additional punctate focus of infarction within left frontal cortex (series 3, image 33). Multiple small chronic infarcts are present within the cerebellum, right frontal cortex, left parietal cortex, and bilateral occipital lobes. Confluent nonspecific T2 FLAIR hyperintensities in subcortical and periventricular white matter are compatible with moderate to severe chronic microvascular ischemic changes. Moderate volume loss of the brain. Vascular: Normal flow voids. Skull and  upper cervical spine: Normal marrow signal. Sinuses/Orbits: Negative. Other: Bilateral intra-ocular lens replacement. IMPRESSION: 1. 11 mm acute/early subacute infarction within the right lateral thalamus. No associated hemorrhage or mass effect. Probable additional punctate focus of infarction in left frontal cortex. 2. Moderate to severe chronic microvascular ischemic changes and moderate volume loss of the brain well as multiple small chronic infarcts in the brain with progression from 2015. These results were called by telephone at the time of interpretation on 12/07/2018 at 3:20 pm to Dr. Milon Dikes , who verbally acknowledged these results. Electronically Signed   By: Mitzi Hansen M.D.   On: 12/07/2018  15:21   Ct Head Code Stroke Wo Contrast  Result Date: 12/07/2018 CLINICAL DATA:  Code stroke. Left-sided weakness and facial droop. Last seen normal 1030 hours EXAM: CT HEAD WITHOUT CONTRAST TECHNIQUE: Contiguous axial images were obtained from the base of the skull through the vertex without intravenous contrast. COMPARISON:  CT head 07/25/2018 FINDINGS: Brain: Mild to moderate atrophy. Chronic infarct right frontal lobe unchanged. Small chronic infarct left occipital lobe unchanged. Chronic microvascular ischemia in the white matter. Benign-appearing calcification right pons is unchanged. Negative for acute infarct, hemorrhage, or mass Vascular: Negative for hyperdense vessel Skull: Negative Sinuses/Orbits: Paranasal sinuses clear.  Negative orbit Other: None ASPECTS ([place] Stroke Program Early CT Score) - Ganglionic level infarction (caudate, lentiform nuclei, internal capsule, insula, M1-M3 cortex): 7 - Supraganglionic infarction (M4-M6 cortex): 3 Total score (0-10 with 10 being normal): 10 IMPRESSION: 1. No acute intracranial abnormality. 2. ASPECTS is 10 Electronically Signed   By: Marlan Palau M.D.   On: 12/07/2018 12:29    EKG: Independently reviewed.  Normal sinus rhythm.  No acute  changes.  Assessment/Plan Principal Problem:   Acute ischemic right MCA stroke (HCC) Active Problems:   Mild cognitive impairment, so stated   Essential hypertension   Hyperlipidemia   Moderate dementia without behavioral disturbance (HCC)   New onset seizure (HCC)   Stroke (HCC)     1.  Acute ischemic stroke, right MCA territory: Probably due to right ICA stenosis. Admit to monitored unit because of severity of symptoms. Neurochecks and vital signs as per stroke protocol. Patient was not a TPA and vascular intervention candidate because of minimal findings. Diet , cardiac diet after bedside swallow evaluation. Antiplatelets, resume aspirin.  Due to intracranial stenosis, patient may benefit with Plavix, will defer to neurology.  patient is on simvastatin that he will continue. Blood pressure goals , not to treat blood pressures today.  Will allow permissive hypertension. Consultations, neurology, speech, PT OT MRI of the brain resulted. CTA head neck, resulted with right ICA stenosis. 2D echocardiogram ordered. A1c and lipid profile.  2.  Early dementia with seizure disorder: Continue Keppra as per neurology recommendation.  3.  Hypertension: Permissive hypertension today.  Will not resume any antihypertensive medications today.  4.  Hyperlipidemia: On simvastatin.  Will check lipid profile.    DVT prophylaxis: Lovenox Code Status: Full code Family Communication: Wife and daughter at bedside Disposition Plan: Home with outpatient therapies Consults called: Neurology Admission status: Observation   Dorcas Carrow MD [facility] Pager [phone]  If 7PM-7AM, please contact night-coverage [email or web address] Password [facility]  12/07/2018, 3:44 PM

## 2018-12-08 ENCOUNTER — Observation Stay (HOSPITAL_BASED_OUTPATIENT_CLINIC_OR_DEPARTMENT_OTHER): Payer: PPO

## 2018-12-08 DIAGNOSIS — I361 Nonrheumatic tricuspid (valve) insufficiency: Secondary | ICD-10-CM

## 2018-12-08 DIAGNOSIS — I340 Nonrheumatic mitral (valve) insufficiency: Secondary | ICD-10-CM

## 2018-12-08 DIAGNOSIS — Z888 Allergy status to other drugs, medicaments and biological substances status: Secondary | ICD-10-CM | POA: Diagnosis not present

## 2018-12-08 DIAGNOSIS — R402242 Coma scale, best verbal response, confused conversation, at arrival to emergency department: Secondary | ICD-10-CM | POA: Diagnosis present

## 2018-12-08 DIAGNOSIS — R29810 Facial weakness: Secondary | ICD-10-CM | POA: Diagnosis present

## 2018-12-08 DIAGNOSIS — E669 Obesity, unspecified: Secondary | ICD-10-CM | POA: Diagnosis present

## 2018-12-08 DIAGNOSIS — R402362 Coma scale, best motor response, obeys commands, at arrival to emergency department: Secondary | ICD-10-CM | POA: Diagnosis present

## 2018-12-08 DIAGNOSIS — E785 Hyperlipidemia, unspecified: Secondary | ICD-10-CM | POA: Diagnosis present

## 2018-12-08 DIAGNOSIS — Z91013 Allergy to seafood: Secondary | ICD-10-CM | POA: Diagnosis not present

## 2018-12-08 DIAGNOSIS — R29704 NIHSS score 4: Secondary | ICD-10-CM | POA: Diagnosis present

## 2018-12-08 DIAGNOSIS — R413 Other amnesia: Secondary | ICD-10-CM | POA: Diagnosis present

## 2018-12-08 DIAGNOSIS — Z8249 Family history of ischemic heart disease and other diseases of the circulatory system: Secondary | ICD-10-CM | POA: Diagnosis not present

## 2018-12-08 DIAGNOSIS — Z7982 Long term (current) use of aspirin: Secondary | ICD-10-CM | POA: Diagnosis not present

## 2018-12-08 DIAGNOSIS — R2990 Unspecified symptoms and signs involving the nervous system: Secondary | ICD-10-CM | POA: Diagnosis not present

## 2018-12-08 DIAGNOSIS — G40909 Epilepsy, unspecified, not intractable, without status epilepticus: Secondary | ICD-10-CM | POA: Diagnosis present

## 2018-12-08 DIAGNOSIS — I10 Essential (primary) hypertension: Secondary | ICD-10-CM | POA: Diagnosis present

## 2018-12-08 DIAGNOSIS — Z6834 Body mass index (BMI) 34.0-34.9, adult: Secondary | ICD-10-CM | POA: Diagnosis not present

## 2018-12-08 DIAGNOSIS — M329 Systemic lupus erythematosus, unspecified: Secondary | ICD-10-CM | POA: Diagnosis present

## 2018-12-08 DIAGNOSIS — R402142 Coma scale, eyes open, spontaneous, at arrival to emergency department: Secondary | ICD-10-CM | POA: Diagnosis present

## 2018-12-08 DIAGNOSIS — I63511 Cerebral infarction due to unspecified occlusion or stenosis of right middle cerebral artery: Secondary | ICD-10-CM | POA: Diagnosis present

## 2018-12-08 DIAGNOSIS — E1151 Type 2 diabetes mellitus with diabetic peripheral angiopathy without gangrene: Secondary | ICD-10-CM | POA: Diagnosis present

## 2018-12-08 DIAGNOSIS — R4781 Slurred speech: Secondary | ICD-10-CM | POA: Diagnosis present

## 2018-12-08 DIAGNOSIS — Z79899 Other long term (current) drug therapy: Secondary | ICD-10-CM | POA: Diagnosis not present

## 2018-12-08 DIAGNOSIS — F0390 Unspecified dementia without behavioral disturbance: Secondary | ICD-10-CM | POA: Diagnosis present

## 2018-12-08 LAB — ECHOCARDIOGRAM COMPLETE
Height: 70 in
Weight: 3827.19 oz

## 2018-12-08 LAB — HEMOGLOBIN A1C
Hgb A1c MFr Bld: 8.5 % — ABNORMAL HIGH (ref 4.8–5.6)
MEAN PLASMA GLUCOSE: 197.25 mg/dL

## 2018-12-08 LAB — BASIC METABOLIC PANEL
Anion gap: 7 (ref 5–15)
BUN: 20 mg/dL (ref 8–23)
CO2: 25 mmol/L (ref 22–32)
Calcium: 9 mg/dL (ref 8.9–10.3)
Chloride: 106 mmol/L (ref 98–111)
Creatinine, Ser: 1.51 mg/dL — ABNORMAL HIGH (ref 0.61–1.24)
GFR calc non Af Amer: 46 mL/min — ABNORMAL LOW (ref >60)
GFR, EST AFRICAN AMERICAN: 53 mL/min — AB (ref >60)
Glucose, Bld: 190 mg/dL — ABNORMAL HIGH (ref 70–99)
Potassium: 4.1 mmol/L (ref 3.5–5.1)
Sodium: 138 mmol/L (ref 135–145)

## 2018-12-08 LAB — LIPID PANEL
Cholesterol: 119 mg/dL (ref 0–200)
HDL: 34 mg/dL — ABNORMAL LOW (ref >40)
LDL Cholesterol: 74 mg/dL (ref 0–99)
Total CHOL/HDL Ratio: 3.5 RATIO
Triglycerides: 54 mg/dL (ref <150)
VLDL: 11 mg/dL (ref 0–40)

## 2018-12-08 LAB — MAGNESIUM: Magnesium: 1.6 mg/dL — ABNORMAL LOW (ref 1.7–2.4)

## 2018-12-08 MED ORDER — MAGNESIUM SULFATE 2 GM/50ML IV SOLN
2.0000 g | Freq: Once | INTRAVENOUS | Status: AC
  Administered 2018-12-08: 2 g via INTRAVENOUS
  Filled 2018-12-08: qty 50

## 2018-12-08 NOTE — Progress Notes (Signed)
SLP Cancellation Note  Patient Details Name: David Irwin MRN: [id number] DOB: November 16, 1945   Cancelled treatment:       Reason Eval/Treat Not Completed: Patient declined, no reason specified. Pt is currently resting and his wife requested that the evaluation be attempted later. SLP will follow up.   [name] I. Vear Clock, MS, CCC-SLP Acute Rehabilitation Services Office number [phone] Pager [phone]  [name] 12/08/2018, 3:37 PM

## 2018-12-08 NOTE — Progress Notes (Signed)
PROGRESS NOTE    David Irwin  ZOX:[id number] DOB: 03/21/1946 DOA: 12/07/2018 PCP: Daisy Floro, MD  Brief Narrative: 73 year old male with history of hypertension, diabetes mellitus, lupus, dementia, seizure disorder presented to the ED with slurring of speech and stumbling x1 day. -Upon further work-up in the emergency room MRI noted acute right thalamic infarct   Assessment & Plan:     Acute ischemic right MCA stroke (HCC) -MRI noted acute right thalamic infarct -CTA head and neck noted severe right ICA stenosis -2D echocardiogram done this morning, pending -LDL is 74 -Hemoglobin A1c is 8.5 -He is on aspirin 81 mg at baseline currently on 325 mg daily neurology consulting will likely add Plavix await input -PT OT/SLP evaluations today  History of dementia and seizure disorder -Continue Keppra per home regimen  Hypertension -Continue to hold antihypertensives for permissive hypertension  Dyslipidemia -Continue statin   DVT prophylaxis: Lovenox Code Status: Full code Family Communication: Wife and daughter at bedside Disposition Plan: Home pending above work-up  Consultants:   Neurology   Procedures:   Antimicrobials:    Subjective: -Wife reports improvement in slurring  Objective: Vitals:   12/07/18 2230 12/08/18 0030 12/08/18 0420 12/08/18 1203  BP: (!) 167/93  (!) 156/80 (!) 144/84  Pulse: 100 80 70 77  Resp:    17  Temp: 97.8 F (36.6 C) 99 F (37.2 C) 97.7 F (36.5 C) 98.4 F (36.9 C)  TempSrc: Oral Oral Oral Oral  SpO2: 99% 99% 99% 96%  Weight:      Height:        Intake/Output Summary (Last 24 hours) at 12/08/2018 1345 Last data filed at 12/08/2018 8119 Gross per 24 hour  Intake 150 ml  Output 700 ml  Net -550 ml   Filed Weights   12/07/18 1205  Weight: 108.5 kg    Examination:  General exam: Elderly frail chronically ill-appearing male, laying in bed, no distress, alert awake oriented to self and partly to place Respiratory  system: Clear to auscultation. Respiratory effort normal. Cardiovascular system: S1 & S2 heard, RRR Gastrointestinal system: Abdomen is nondistended, soft and nontender.Normal bowel sounds heard. Central nervous system: Alert and oriented to self and place, mild dysarthria noted, no other focal abnormalities elicited Extremities: Symmetric 5 x 5 power. Skin: No rashes, lesions or ulcers Psychiatry: Flat affect   Data Reviewed:   CBC: Recent Labs  Lab 12/07/18 1215  WBC 3.6*  NEUTROABS 1.7  HGB 12.3*  HCT 38.1*  MCV 96.5  PLT 129*   Basic Metabolic Panel: Recent Labs  Lab 12/07/18 1215 12/07/18 1218 12/07/18 2327  NA 137  --  138  K 4.5  --  4.1  CL 105  --  106  CO2 24  --  25  GLUCOSE 173*  --  190*  BUN 21  --  20  CREATININE 1.57* 1.60* 1.51*  CALCIUM 9.2  --  9.0  MG  --   --  1.6*   GFR: Estimated Creatinine Clearance: 55.3 mL/min (A) (by C-G formula based on SCr of 1.51 mg/dL (H)). Liver Function Tests: Recent Labs  Lab 12/07/18 1215  AST 23  ALT 19  ALKPHOS 55  BILITOT 0.9  PROT 8.1  ALBUMIN 4.2   No results for input(s): LIPASE, AMYLASE in the last 168 hours. No results for input(s): AMMONIA in the last 168 hours. Coagulation Profile: Recent Labs  Lab 12/07/18 1215  INR 1.1   Cardiac Enzymes: No results for input(s): CKTOTAL, CKMB, CKMBINDEX,  TROPONINI in the last 168 hours. BNP (last 3 results) No results for input(s): PROBNP in the last 8760 hours. HbA1C: Recent Labs    12/08/18 0504  HGBA1C 8.5*   CBG: Recent Labs  Lab 12/07/18 1251  GLUCAP 144*   Lipid Profile: Recent Labs    12/08/18 0504  CHOL 119  HDL 34*  LDLCALC 74  TRIG 54  CHOLHDL 3.5   Thyroid Function Tests: No results for input(s): TSH, T4TOTAL, FREET4, T3FREE, THYROIDAB in the last 72 hours. Anemia Panel: No results for input(s): VITAMINB12, FOLATE, FERRITIN, TIBC, IRON, RETICCTPCT in the last 72 hours. Urine analysis:    Component Value Date/Time    COLORURINE STRAW (A) 12/07/2018 1335   APPEARANCEUR CLEAR 12/07/2018 1335   LABSPEC 1.020 12/07/2018 1335   PHURINE 7.0 12/07/2018 1335   GLUCOSEU NEGATIVE 12/07/2018 1335   HGBUR NEGATIVE 12/07/2018 1335   BILIRUBINUR NEGATIVE 12/07/2018 1335   KETONESUR NEGATIVE 12/07/2018 1335   PROTEINUR NEGATIVE 12/07/2018 1335   NITRITE NEGATIVE 12/07/2018 1335   LEUKOCYTESUR NEGATIVE 12/07/2018 1335   Sepsis Labs: @LABRCNTIP (procalcitonin:4,lacticidven:4)  )No results found for this or any previous visit (from the past 240 hour(s)).       Radiology Studies: Ct Angio Head W Or Wo Contrast  Result Date: 12/07/2018 CLINICAL DATA:  73 year old male code stroke. Left side weakness and facial droop last seen normal at 1030 hours. EXAM: CT ANGIOGRAPHY HEAD AND NECK TECHNIQUE: Multidetector CT imaging of the head and neck was performed using the standard protocol during bolus administration of intravenous contrast. Multiplanar CT image reconstructions and MIPs were obtained to evaluate the vascular anatomy. Carotid stenosis measurements (when applicable) are obtained utilizing NASCET criteria, using the distal internal carotid diameter as the denominator. CONTRAST:  59mL OMNIPAQUE IOHEXOL 350 MG/ML SOLN COMPARISON:  Head CT without contrast 1221 hours today. Brain MRI and intracranial MRA 08/18/2014. FINDINGS: CTA NECK Skeleton: Negative. Absent dentition. Upper chest: Mild atelectasis in the upper lungs. No superior mediastinal lymphadenopathy. Other neck: Negative. Aortic arch: Aberrant origin of the right subclavian artery. Mild aortic arch calcified plaque. Right carotid system: Minimal plaque in the right CCA proximal to the bifurcation. Soft and calcified plaque at the right bifurcation and ICA origin without stenosis. Negative cervical right ICA. Left carotid system: Left CCA origin mildly obscured by dense left subclavian vein contrast. Mildly tortuous proximal left CCA. Mild calcified plaque proximal  to the bifurcation without stenosis. Bulky calcified plaque at the left ICA origin resulting in 50 % stenosis with respect to the distal vessel. Negative cervical left ICA otherwise. Vertebral arteries: Aberrant origin of the right subclavian artery with calcified plaque but no stenosis. The right vertebral artery is non dominant and seems to arise from the right CCA rather than the right subclavian on series 7, image 278. It has a late entry into the cervical transverse foramen and remains diminutive but patent to the skull base. No proximal left subclavian artery stenosis. Minimal calcified plaque at the. Tortuous left V1 left vertebral artery origin without stenosis segment. The left vertebral is dominant and patent to the skull base without stenosis. CTA HEAD Posterior circulation: Diminutive non dominant right vertebral artery remains patent to the vertebrobasilar junction. The left vertebral artery is dominant and supplies the basilar with a normal left PICA origin. Patent basilar artery without stenosis. Normal SCA and left PCA origins. Fetal type right PCA origin. The left posterior communicating artery is also present. Mild bilateral PCA tortuosity. Visible PCA branches appear within normal limits.  Anterior circulation: Patent ICA siphons. The left siphon is calcified through the cavernous and supraclinoid segments with mild stenosis. The right siphon demonstrates similar extensive calcification, but with moderate to severe supraclinoid segment stenosis as seen on series 7, image 99 and series 9, image 84. Normal posterior communicating artery origins. Patent carotid termini. Normal MCA and ACA origins. Diminutive or absent anterior communicating artery. Bilateral ACA branches are within normal limits. The left MCA M1 segment is tortuous. The left MCA bifurcation and left MCA branches are within normal limits. The right MCA M1 segment and bifurcation are patent without stenosis. Right MCA branches are  within normal limits. Venous sinuses: Early contrast timing limits evaluation. Anatomic variants: Aberrant right subclavian artery origin. Dominant left vertebral artery. Non dominant and diminutive right vertebral artery which seems to arise from the right CCA rather than the subclavian, and remains diminutive to the vertebrobasilar junction. Fetal type right PCA origin. Review of the MIP images confirms the above findings IMPRESSION: 1. Negative for large vessel occlusion. 2. Positive for moderate to severe Right ICA supraclinoid segment stenosis due to calcified plaque. Left ICA siphon and bilateral carotid bifurcation atherosclerosis without significant stenosis. 3. 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 The non dominant right vertebral artery is diminutive and appears to arise from the right CCA rather than the right subclavian. Salient findings communicated to Dr. Laurence Slate at 1:04 pmon 3/4/2020by text page via the [facility] messaging system. Electronically Signed   By: Odessa Fleming M.D.   On: 12/07/2018 13:06   Ct Angio Neck W Or Wo Contrast  Result Date: 12/07/2018 CLINICAL DATA:  73 year old male code stroke. Left side weakness and facial droop last seen normal at 1030 hours. EXAM: CT ANGIOGRAPHY HEAD AND NECK TECHNIQUE: Multidetector CT imaging of the head and neck was performed using the standard protocol during bolus administration of intravenous contrast. Multiplanar CT image reconstructions and MIPs were obtained to evaluate the vascular anatomy. Carotid stenosis measurements (when applicable) are obtained utilizing NASCET criteria, using the distal internal carotid diameter as the denominator. CONTRAST:  110mL OMNIPAQUE IOHEXOL 350 MG/ML SOLN COMPARISON:  Head CT without contrast 1221 hours today. Brain MRI and intracranial MRA 08/18/2014. FINDINGS: CTA NECK Skeleton: Negative. Absent dentition. Upper chest: Mild atelectasis in the upper  lungs. No superior mediastinal lymphadenopathy. Other neck: Negative. Aortic arch: Aberrant origin of the right subclavian artery. Mild aortic arch calcified plaque. Right carotid system: Minimal plaque in the right CCA proximal to the bifurcation. Soft and calcified plaque at the right bifurcation and ICA origin without stenosis. Negative cervical right ICA. Left carotid system: Left CCA origin mildly obscured by dense left subclavian vein contrast. Mildly tortuous proximal left CCA. Mild calcified plaque proximal to the bifurcation without stenosis. Bulky calcified plaque at the left ICA origin resulting in 50 % stenosis with respect to the distal vessel. Negative cervical left ICA otherwise. Vertebral arteries: Aberrant origin of the right subclavian artery with calcified plaque but no stenosis. The right vertebral artery is non dominant and seems to arise from the right CCA rather than the right subclavian on series 7, image 278. It has a late entry into the cervical transverse foramen and remains diminutive but patent to the skull base. No proximal left subclavian artery stenosis. Minimal calcified plaque at the. Tortuous left V1 left vertebral artery origin without stenosis segment. The left vertebral is dominant and patent to the skull base without stenosis. CTA HEAD Posterior circulation:  Diminutive non dominant right vertebral artery remains patent to the vertebrobasilar junction. The left vertebral artery is dominant and supplies the basilar with a normal left PICA origin. Patent basilar artery without stenosis. Normal SCA and left PCA origins. Fetal type right PCA origin. The left posterior communicating artery is also present. Mild bilateral PCA tortuosity. Visible PCA branches appear within normal limits. Anterior circulation: Patent ICA siphons. The left siphon is calcified through the cavernous and supraclinoid segments with mild stenosis. The right siphon demonstrates similar extensive calcification,  but with moderate to severe supraclinoid segment stenosis as seen on series 7, image 99 and series 9, image 84. Normal posterior communicating artery origins. Patent carotid termini. Normal MCA and ACA origins. Diminutive or absent anterior communicating artery. Bilateral ACA branches are within normal limits. The left MCA M1 segment is tortuous. The left MCA bifurcation and left MCA branches are within normal limits. The right MCA M1 segment and bifurcation are patent without stenosis. Right MCA branches are within normal limits. Venous sinuses: Early contrast timing limits evaluation. Anatomic variants: Aberrant right subclavian artery origin. Dominant left vertebral artery. Non dominant and diminutive right vertebral artery which seems to arise from the right CCA rather than the subclavian, and remains diminutive to the vertebrobasilar junction. Fetal type right PCA origin. Review of the MIP images confirms the above findings IMPRESSION: 1. Negative for large vessel occlusion. 2. Positive for moderate to severe Right ICA supraclinoid segment stenosis due to calcified plaque. Left ICA siphon and bilateral carotid bifurcation atherosclerosis without significant stenosis. 3. 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 The non dominant right vertebral artery is diminutive and appears to arise from the right CCA rather than the right subclavian. Salient findings communicated to Dr. Laurence Slate at 1:04 pmon 3/4/2020by text page via the [facility] messaging system. Electronically Signed   By: Odessa Fleming M.D.   On: 12/07/2018 13:06   Mr Brain Wo Contrast  Result Date: 12/07/2018 CLINICAL DATA:  73 y/o  M; left-sided weakness and facial droop. EXAM: MRI HEAD WITHOUT CONTRAST TECHNIQUE: Multiplanar, multiecho pulse sequences of the brain and surrounding structures were obtained without intravenous contrast. COMPARISON:  12/07/2018 CT head and CTA head.  08/18/2014 MRI  head. FINDINGS: Brain: Motion degradation of multiple pulse sequences. 11 mm focus of reduced diffusion within the right lateral thalamus compatible with acute/early subacute infarction. No associated hemorrhage or mass effect. Probable additional punctate focus of infarction within left frontal cortex (series 3, image 33). Multiple small chronic infarcts are present within the cerebellum, right frontal cortex, left parietal cortex, and bilateral occipital lobes. Confluent nonspecific T2 FLAIR hyperintensities in subcortical and periventricular white matter are compatible with moderate to severe chronic microvascular ischemic changes. Moderate volume loss of the brain. Vascular: Normal flow voids. Skull and upper cervical spine: Normal marrow signal. Sinuses/Orbits: Negative. Other: Bilateral intra-ocular lens replacement. IMPRESSION: 1. 11 mm acute/early subacute infarction within the right lateral thalamus. No associated hemorrhage or mass effect. Probable additional punctate focus of infarction in left frontal cortex. 2. Moderate to severe chronic microvascular ischemic changes and moderate volume loss of the brain well as multiple small chronic infarcts in the brain with progression from 2015. These results were called by telephone at the time of interpretation on 12/07/2018 at 3:20 pm to Dr. Milon Dikes , who verbally acknowledged these results. Electronically Signed   By: Mitzi Hansen M.D.   On: 12/07/2018 15:21   Ct Head Code Stroke  Wo Contrast  Result Date: 12/07/2018 CLINICAL DATA:  Code stroke. Left-sided weakness and facial droop. Last seen normal 1030 hours EXAM: CT HEAD WITHOUT CONTRAST TECHNIQUE: Contiguous axial images were obtained from the base of the skull through the vertex without intravenous contrast. COMPARISON:  CT head 07/25/2018 FINDINGS: Brain: Mild to moderate atrophy. Chronic infarct right frontal lobe unchanged. Small chronic infarct left occipital lobe unchanged. Chronic  microvascular ischemia in the white matter. Benign-appearing calcification right pons is unchanged. Negative for acute infarct, hemorrhage, or mass Vascular: Negative for hyperdense vessel Skull: Negative Sinuses/Orbits: Paranasal sinuses clear.  Negative orbit Other: None ASPECTS (Alberta Stroke Program Early CT Score) - Ganglionic level infarction (caudate, lentiform nuclei, internal capsule, insula, M1-M3 cortex): 7 - Supraganglionic infarction (M4-M6 cortex): 3 Total score (0-10 with 10 being normal): 10 IMPRESSION: 1. No acute intracranial abnormality. 2. ASPECTS is 10 Electronically Signed   By: Marlan Palau M.D.   On: 12/07/2018 12:29        Scheduled Meds: . aspirin EC  81 mg Oral Daily  . enoxaparin (LOVENOX) injection  50 mg Subcutaneous Q24H  . hydroxychloroquine  200 mg Oral BID  . levETIRAcetam  500 mg Oral BID  . potassium chloride SA  20 mEq Oral Daily  . simvastatin  20 mg Oral QHS  . tamsulosin  0.4 mg Oral Daily  . vitamin B-12  1,000 mcg Oral Daily   Continuous Infusions:   LOS: 0 days    Time spent:    Zannie Cove, MD [facility]  12/08/2018, 1:45 PM

## 2018-12-08 NOTE — Evaluation (Signed)
Occupational Therapy Evaluation Patient Details Name: David Irwin MRN: [id number] DOB: 10-Feb-1946 Today's Date: 12/08/2018    History of Present Illness 73 year old male with history of hypertension, diabetes mellitus, lupus, dementia, seizure disorder presented to the ED with slurring of speech and stumbling x1 day. Pt s/p R thalamic CVA.   Clinical Impression   Patient presenting with decreased I in self care, balance, functional mobility/transfers, safety awareness, strength, and endurance.  Patient reports being mod I and recently began using cane for ambualtion PTA. Patient currently functioning at min - mod A. Patient will benefit from acute OT to increase overall independence in the areas of ADLs, functional mobility, and safety awareness in order to safely discharge to next venue of care.    Follow Up Recommendations  SNF    Equipment Recommendations  None recommended by OT    Recommendations for Other Services Other (comment)(none at this time)     Precautions / Restrictions Precautions Precautions: Fall Precaution Comments: possible seizure activity      Mobility Bed Mobility Overal bed mobility: Needs Assistance Bed Mobility: Supine to Sit     Supine to sit: Min assist     General bed mobility comments: min A trunk elevation with min cuing for proper technique  Transfers    General transfer comment: deferred per RN request as caregiver reported possible seizure prior to evaluation    Balance Overall balance assessment: Needs assistance Sitting-balance support: Feet supported Sitting balance-Leahy Scale: Good Sitting balance - Comments: static sitting balance with supervision for safety         ADL either performed or assessed with clinical judgement   ADL Overall ADL's : Needs assistance/impaired     Grooming: Wash/dry hands;Wash/dry face;Set up;Oral care;Cueing for sequencing;Sitting   Upper Body Bathing: Set up;Sitting   Lower Body Bathing:  Moderate assistance;Minimal assistance;Sit to/from stand   Upper Body Dressing : Set up;Sitting   Lower Body Dressing: Minimal assistance;Sit to/from stand;Moderate assistance           Vision Baseline Vision/History: Wears glasses Wears Glasses: At all times Patient Visual Report: No change from baseline              Pertinent Vitals/Pain Pain Assessment: Faces Faces Pain Scale: No hurt     Hand Dominance Right   Extremity/Trunk Assessment Upper Extremity Assessment Upper Extremity Assessment: Generalized weakness   Lower Extremity Assessment Lower Extremity Assessment: Defer to PT evaluation   Cervical / Trunk Assessment Cervical / Trunk Assessment: Kyphotic   Communication Communication Communication: Expressive difficulties   Cognition Arousal/Alertness: Awake/alert Behavior During Therapy: WFL for tasks assessed/performed Overall Cognitive Status: History of cognitive impairments - at baseline                   Home Living Family/patient expects to be discharged to:: Private residence Living Arrangements: Spouse/significant other;Children Available Help at Discharge: Family;Available 24 hours/day Type of Home: House     Bathroom Shower/Tub: Chief Strategy Officer: Standard     Home Equipment: Cane - single point          Prior Functioning/Environment Level of Independence: Independent with assistive device(s)        Comments: mod I PTA for self care tasks and recently got cane ~ 1 wk prior per caregiver        OT Problem List: Decreased strength;Decreased knowledge of use of DME or AE;Decreased range of motion;Decreased coordination;Decreased activity tolerance;Cardiopulmonary status limiting activity;Impaired balance (sitting and/or standing);Pain;Decreased safety awareness;Decreased cognition  OT Treatment/Interventions: Self-care/ADL training;Balance training;Therapeutic exercise;Neuromuscular education;Therapeutic  activities;Energy conservation;Cognitive remediation/compensation;DME and/or AE instruction;Patient/family education;Manual therapy    OT Goals(Current goals can be found in the care plan section) Acute Rehab OT Goals Patient Stated Goal: to get stronger OT Goal Formulation: With patient/family Time For Goal Achievement: 12/22/18 Potential to Achieve Goals: Good ADL Goals Pt Will Perform Upper Body Bathing: with supervision Pt Will Perform Lower Body Bathing: with supervision Pt Will Perform Upper Body Dressing: with supervision Pt Will Perform Lower Body Dressing: with supervision Pt Will Transfer to Toilet: with supervision Pt Will Perform Toileting - Clothing Manipulation and hygiene: with supervision Pt Will Perform Tub/Shower Transfer: with supervision  OT Frequency: Min 2X/week   Barriers to D/C: Other (comment)  none known at this time          AM-PAC OT "6 Clicks" Daily Activity     Outcome Measure Help from another person eating meals?: None Help from another person taking care of personal grooming?: A Little Help from another person toileting, which includes using toliet, bedpan, or urinal?: A Lot Help from another person bathing (including washing, rinsing, drying)?: A Lot Help from another person to put on and taking off regular upper body clothing?: A Little Help from another person to put on and taking off regular lower body clothing?: A Lot 6 Click Score: 16   End of Session Nurse Communication: Mobility status;Precautions  Activity Tolerance: Patient tolerated treatment well Patient left:    OT Visit Diagnosis: Muscle weakness (generalized) (M62.81)                Time: 3817-7116 OT Time Calculation (min): 17 min Charges:  OT General Charges $OT Visit: 1 Visit OT Evaluation $OT Eval Low Complexity: 1 Low  [name], MS, OTR/L 12/08/2018, 2:05 PM

## 2018-12-08 NOTE — Progress Notes (Signed)
  Echocardiogram 2D Echocardiogram has been performed.  David Irwin 12/08/2018, 9:07 AM

## 2018-12-08 NOTE — Evaluation (Signed)
Speech Language Pathology Evaluation Patient Details Name: David Irwin MRN: [id number] DOB: 1946/05/28 Today's Date: 12/08/2018 Time: 1635-1700 SLP Time Calculation (min) (ACUTE ONLY): 25 min  Problem List:  Patient Active Problem List   Diagnosis Date Noted  . Acute ischemic right MCA stroke (HCC) 12/07/2018  . Stroke (HCC) 12/07/2018  . Moderate dementia without behavioral disturbance (HCC) 07/27/2018  . New onset seizure (HCC) 07/27/2018  . Depression 12/16/2017  . Essential hypertension 03/26/2017  . Hyperlipidemia 03/26/2017  . Cardiac murmur 03/26/2017  . Mild cognitive impairment, so stated 05/01/2013  . Memory loss 05/01/2013   Past Medical History:  Past Medical History:  Diagnosis Date  . Diabetes mellitus without complication (HCC)   . Hypertension   . Lupus (HCC)   . Memory loss   . Murmur, cardiac   . Nocturia    Past Surgical History:  Past Surgical History:  Procedure Laterality Date  . APPENDECTOMY    . EYE SURGERY     HPI:  Pt is a 73 y.o. male with medical history significant of hypertension, diabetes not on treatment, lupus, early dementia with dementia related seizure disorder who presented to the emergency room from primary care physician office where he was found to have possible stroke with symptoms of "slurring and stumbling". MRI from 12/07/18 revealed 11 mm acute/early subacute infarction within the right lateral thalamus without hemorrhage or mass effect. Probable additional punctate focus of infarction in left frontal cortex.   Assessment / Plan / Recommendation Clinical Impression  Pt participated in speech/language evaluation with his wife present. Both parties denied any prior or current deficits in speech or language but indicated the pt has been having increasing difficulty with memory for the past five years. His speech and language skills are currently within normal limits. Deficits were noted in memory but the pt and his wife indicated that  his performance during the session did not appear different from his baseline. Further skilled SLP services are not clinically indicated at time. Pt, nursing, and his wife were educated regarding this and all parties verbalized understanding as well as agreement with plan of care.    SLP Assessment  SLP Recommendation/Assessment: Patient does not need any further Speech Lanaguage Pathology Services SLP Visit Diagnosis: Cognitive communication deficit (R41.841)    Follow Up Recommendations  None    Frequency and Duration           SLP Evaluation Cognition  Overall Cognitive Status: History of cognitive impairments - at baseline Arousal/Alertness: Awake/alert Orientation Level: Oriented to person;Disoriented to place;Disoriented to time;Disoriented to situation Attention: Focused Focused Attention: Appears intact Memory: Impaired Memory Impairment: Storage deficit;Retrieval deficit Awareness: Appears intact       Comprehension  Auditory Comprehension Overall Auditory Comprehension: Appears within functional limits for tasks assessed Yes/No Questions: Within Functional Limits(SImple: 5/5; Complex: 5/5) Commands: Within Functional Limits One Step Basic Commands: (4/4) Two Step Basic Commands: (4/4) Multistep Basic Commands: (3/3) Other Conversation Comments: Paragraph (yes/no questions): 3/4 Reading Comprehension Reading Status: Not tested    Expression Expression Primary Mode of Expression: Verbal Verbal Expression Overall Verbal Expression: Appears within functional limits for tasks assessed Initiation: No impairment Automatic Speech: Counting;Day of week;Month of year(WNL) Level of Generative/Spontaneous Verbalization: Conversation Repetition: No impairment(5/5) Naming: No impairment(Confrontational: 10/10; Responsive: 5/5) Pragmatics: No impairment Written Expression Dominant Hand: Right   Oral / Motor  Motor Speech Overall Motor Speech: Appears within functional  limits for tasks assessed Respiration: Within functional limits Phonation: Normal Resonance: Within functional limits Articulation: Within  functional limitis Intelligibility: Intelligible Motor Planning: Witnin functional limits Motor Speech Errors: Not applicable   David Bramer I. Vear Clock, MS, CCC-SLP Acute Rehabilitation Services Office number [phone] Pager [phone]                   [name] 12/08/2018, 5:11 PM

## 2018-12-08 NOTE — Progress Notes (Addendum)
STROKE TEAM PROGRESS NOTE   INTERVAL HISTORY Patient in bed, NAD. His wife and friend at the bedside. I reviewed history of presenting illness the patient.  Vitals:   12/07/18 2030 12/07/18 2230 12/08/18 0030 12/08/18 0420  BP: (!) 156/71 (!) 167/93  (!) 156/80  Pulse: 97 100 80 70  Resp:      Temp: 97.7 F (36.5 C) 97.8 F (36.6 C) 99 F (37.2 C) 97.7 F (36.5 C)  TempSrc: Oral Oral Oral Oral  SpO2: 97% 99% 99% 99%  Weight:      Height:        CBC:  Recent Labs  Lab 12/07/18 1215  WBC 3.6*  NEUTROABS 1.7  HGB 12.3*  HCT 38.1*  MCV 96.5  PLT 129*    Basic Metabolic Panel:  Recent Labs  Lab 12/07/18 1215 12/07/18 1218 12/07/18 2327  NA 137  --  138  K 4.5  --  4.1  CL 105  --  106  CO2 24  --  25  GLUCOSE 173*  --  190*  BUN 21  --  20  CREATININE 1.57* 1.60* 1.51*  CALCIUM 9.2  --  9.0  MG  --   --  1.6*   Lipid Panel:     Component Value Date/Time   CHOL 119 12/08/2018 0504   TRIG 54 12/08/2018 0504   HDL 34 (L) 12/08/2018 0504   CHOLHDL 3.5 12/08/2018 0504   VLDL 11 12/08/2018 0504   LDLCALC 74 12/08/2018 0504   HgbA1c:  Lab Results  Component Value Date   HGBA1C 8.5 (H) 12/08/2018   Urine Drug Screen:     Component Value Date/Time   LABOPIA NONE DETECTED 12/07/2018 1335   COCAINSCRNUR NONE DETECTED 12/07/2018 1335   LABBENZ NONE DETECTED 12/07/2018 1335   AMPHETMU NONE DETECTED 12/07/2018 1335   THCU NONE DETECTED 12/07/2018 1335   LABBARB NONE DETECTED 12/07/2018 1335    Alcohol Level     Component Value Date/Time   ETH <10 12/07/2018 1214    IMAGING Ct Angio Head W Or Wo Contrast  Result Date: 12/07/2018 CLINICAL DATA:  73 year old male code stroke. Left side weakness and facial droop last seen normal at 1030 hours. EXAM: CT ANGIOGRAPHY HEAD AND NECK TECHNIQUE: Multidetector CT imaging of the head and neck was performed using the standard protocol during bolus administration of intravenous contrast. Multiplanar CT image  reconstructions and MIPs were obtained to evaluate the vascular anatomy. Carotid stenosis measurements (when applicable) are obtained utilizing NASCET criteria, using the distal internal carotid diameter as the denominator. CONTRAST:  75mL OMNIPAQUE IOHEXOL 350 MG/ML SOLN COMPARISON:  Head CT without contrast 1221 hours today. Brain MRI and intracranial MRA 08/18/2014. FINDINGS: CTA NECK Skeleton: Negative. Absent dentition. Upper chest: Mild atelectasis in the upper lungs. No superior mediastinal lymphadenopathy. Other neck: Negative. Aortic arch: Aberrant origin of the right subclavian artery. Mild aortic arch calcified plaque. Right carotid system: Minimal plaque in the right CCA proximal to the bifurcation. Soft and calcified plaque at the right bifurcation and ICA origin without stenosis. Negative cervical right ICA. Left carotid system: Left CCA origin mildly obscured by dense left subclavian vein contrast. Mildly tortuous proximal left CCA. Mild calcified plaque proximal to the bifurcation without stenosis. Bulky calcified plaque at the left ICA origin resulting in 50 % stenosis with respect to the distal vessel. Negative cervical left ICA otherwise. Vertebral arteries: Aberrant origin of the right subclavian artery with calcified plaque but no stenosis. The right vertebral  artery is non dominant and seems to arise from the right CCA rather than the right subclavian on series 7, image 278. It has a late entry into the cervical transverse foramen and remains diminutive but patent to the skull base. No proximal left subclavian artery stenosis. Minimal calcified plaque at the. Tortuous left V1 left vertebral artery origin without stenosis segment. The left vertebral is dominant and patent to the skull base without stenosis. CTA HEAD Posterior circulation: Diminutive non dominant right vertebral artery remains patent to the vertebrobasilar junction. The left vertebral artery is dominant and supplies the basilar  with a normal left PICA origin. Patent basilar artery without stenosis. Normal SCA and left PCA origins. Fetal type right PCA origin. The left posterior communicating artery is also present. Mild bilateral PCA tortuosity. Visible PCA branches appear within normal limits. Anterior circulation: Patent ICA siphons. The left siphon is calcified through the cavernous and supraclinoid segments with mild stenosis. The right siphon demonstrates similar extensive calcification, but with moderate to severe supraclinoid segment stenosis as seen on series 7, image 99 and series 9, image 84. Normal posterior communicating artery origins. Patent carotid termini. Normal MCA and ACA origins. Diminutive or absent anterior communicating artery. Bilateral ACA branches are within normal limits. The left MCA M1 segment is tortuous. The left MCA bifurcation and left MCA branches are within normal limits. The right MCA M1 segment and bifurcation are patent without stenosis. Right MCA branches are within normal limits. Venous sinuses: Early contrast timing limits evaluation. Anatomic variants: Aberrant right subclavian artery origin. Dominant left vertebral artery. Non dominant and diminutive right vertebral artery which seems to arise from the right CCA rather than the subclavian, and remains diminutive to the vertebrobasilar junction. Fetal type right PCA origin. Review of the MIP images confirms the above findings IMPRESSION: 1. Negative for large vessel occlusion. 2. Positive for moderate to severe Right ICA supraclinoid segment stenosis due to calcified plaque. Left ICA siphon and bilateral carotid bifurcation atherosclerosis without significant stenosis. 3. 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 The non dominant right vertebral artery is diminutive and appears to arise from the right CCA rather than the right subclavian. Salient findings communicated to Dr. Laurence Slate  at 1:04 pmon 3/4/2020by text page via the [facility] messaging system. Electronically Signed   By: Odessa Fleming M.D.   On: 12/07/2018 13:06   Ct Angio Neck W Or Wo Contrast  Result Date: 12/07/2018 CLINICAL DATA:  73 year old male code stroke. Left side weakness and facial droop last seen normal at 1030 hours. EXAM: CT ANGIOGRAPHY HEAD AND NECK TECHNIQUE: Multidetector CT imaging of the head and neck was performed using the standard protocol during bolus administration of intravenous contrast. Multiplanar CT image reconstructions and MIPs were obtained to evaluate the vascular anatomy. Carotid stenosis measurements (when applicable) are obtained utilizing NASCET criteria, using the distal internal carotid diameter as the denominator. CONTRAST:  2mL OMNIPAQUE IOHEXOL 350 MG/ML SOLN COMPARISON:  Head CT without contrast 1221 hours today. Brain MRI and intracranial MRA 08/18/2014. FINDINGS: CTA NECK Skeleton: Negative. Absent dentition. Upper chest: Mild atelectasis in the upper lungs. No superior mediastinal lymphadenopathy. Other neck: Negative. Aortic arch: Aberrant origin of the right subclavian artery. Mild aortic arch calcified plaque. Right carotid system: Minimal plaque in the right CCA proximal to the bifurcation. Soft and calcified plaque at the right bifurcation and ICA origin without stenosis. Negative cervical right ICA. Left carotid system: Left CCA origin mildly  obscured by dense left subclavian vein contrast. Mildly tortuous proximal left CCA. Mild calcified plaque proximal to the bifurcation without stenosis. Bulky calcified plaque at the left ICA origin resulting in 50 % stenosis with respect to the distal vessel. Negative cervical left ICA otherwise. Vertebral arteries: Aberrant origin of the right subclavian artery with calcified plaque but no stenosis. The right vertebral artery is non dominant and seems to arise from the right CCA rather than the right subclavian on series 7, image 278. It has a late  entry into the cervical transverse foramen and remains diminutive but patent to the skull base. No proximal left subclavian artery stenosis. Minimal calcified plaque at the. Tortuous left V1 left vertebral artery origin without stenosis segment. The left vertebral is dominant and patent to the skull base without stenosis. CTA HEAD Posterior circulation: Diminutive non dominant right vertebral artery remains patent to the vertebrobasilar junction. The left vertebral artery is dominant and supplies the basilar with a normal left PICA origin. Patent basilar artery without stenosis. Normal SCA and left PCA origins. Fetal type right PCA origin. The left posterior communicating artery is also present. Mild bilateral PCA tortuosity. Visible PCA branches appear within normal limits. Anterior circulation: Patent ICA siphons. The left siphon is calcified through the cavernous and supraclinoid segments with mild stenosis. The right siphon demonstrates similar extensive calcification, but with moderate to severe supraclinoid segment stenosis as seen on series 7, image 99 and series 9, image 84. Normal posterior communicating artery origins. Patent carotid termini. Normal MCA and ACA origins. Diminutive or absent anterior communicating artery. Bilateral ACA branches are within normal limits. The left MCA M1 segment is tortuous. The left MCA bifurcation and left MCA branches are within normal limits. The right MCA M1 segment and bifurcation are patent without stenosis. Right MCA branches are within normal limits. Venous sinuses: Early contrast timing limits evaluation. Anatomic variants: Aberrant right subclavian artery origin. Dominant left vertebral artery. Non dominant and diminutive right vertebral artery which seems to arise from the right CCA rather than the subclavian, and remains diminutive to the vertebrobasilar junction. Fetal type right PCA origin. Review of the MIP images confirms the above findings IMPRESSION: 1.  Negative for large vessel occlusion. 2. Positive for moderate to severe Right ICA supraclinoid segment stenosis due to calcified plaque. Left ICA siphon and bilateral carotid bifurcation atherosclerosis without significant stenosis. 3. 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 The non dominant right vertebral artery is diminutive and appears to arise from the right CCA rather than the right subclavian. Salient findings communicated to Dr. Laurence Slate at 1:04 pmon 3/4/2020by text page via the [facility] messaging system. Electronically Signed   By: Odessa Fleming M.D.   On: 12/07/2018 13:06   Mr Brain Wo Contrast  Result Date: 12/07/2018 CLINICAL DATA:  73 y/o  M; left-sided weakness and facial droop. EXAM: MRI HEAD WITHOUT CONTRAST TECHNIQUE: Multiplanar, multiecho pulse sequences of the brain and surrounding structures were obtained without intravenous contrast. COMPARISON:  12/07/2018 CT head and CTA head.  08/18/2014 MRI head. FINDINGS: Brain: Motion degradation of multiple pulse sequences. 11 mm focus of reduced diffusion within the right lateral thalamus compatible with acute/early subacute infarction. No associated hemorrhage or mass effect. Probable additional punctate focus of infarction within left frontal cortex (series 3, image 33). Multiple small chronic infarcts are present within the cerebellum, right frontal cortex, left parietal cortex, and bilateral occipital lobes. Confluent nonspecific T2 FLAIR hyperintensities in  subcortical and periventricular white matter are compatible with moderate to severe chronic microvascular ischemic changes. Moderate volume loss of the brain. Vascular: Normal flow voids. Skull and upper cervical spine: Normal marrow signal. Sinuses/Orbits: Negative. Other: Bilateral intra-ocular lens replacement. IMPRESSION: 1. 11 mm acute/early subacute infarction within the right lateral thalamus. No associated hemorrhage or mass  effect. Probable additional punctate focus of infarction in left frontal cortex. 2. Moderate to severe chronic microvascular ischemic changes and moderate volume loss of the brain well as multiple small chronic infarcts in the brain with progression from 2015. These results were called by telephone at the time of interpretation on 12/07/2018 at 3:20 pm to Dr. Milon Dikes , who verbally acknowledged these results. Electronically Signed   By: Mitzi Hansen M.D.   On: 12/07/2018 15:21   Ct Head Code Stroke Wo Contrast  Result Date: 12/07/2018 CLINICAL DATA:  Code stroke. Left-sided weakness and facial droop. Last seen normal 1030 hours EXAM: CT HEAD WITHOUT CONTRAST TECHNIQUE: Contiguous axial images were obtained from the base of the skull through the vertex without intravenous contrast. COMPARISON:  CT head 07/25/2018 FINDINGS: Brain: Mild to moderate atrophy. Chronic infarct right frontal lobe unchanged. Small chronic infarct left occipital lobe unchanged. Chronic microvascular ischemia in the white matter. Benign-appearing calcification right pons is unchanged. Negative for acute infarct, hemorrhage, or mass Vascular: Negative for hyperdense vessel Skull: Negative Sinuses/Orbits: Paranasal sinuses clear.  Negative orbit Other: None ASPECTS ([place] Stroke Program Early CT Score) - Ganglionic level infarction (caudate, lentiform nuclei, internal capsule, insula, M1-M3 cortex): 7 - Supraganglionic infarction (M4-M6 cortex): 3 Total score (0-10 with 10 being normal): 10 IMPRESSION: 1. No acute intracranial abnormality. 2. ASPECTS is 10 Electronically Signed   By: Marlan Palau M.D.   On: 12/07/2018 12:29    PHYSICAL EXAM : Pleasant elderly African-American male currently not in distress. Mildly obese. . Afebrile. Head is nontraumatic. Neck is supple without bruit.    Cardiac exam no murmur or gallop. Lungs are clear to auscultation. Distal pulses are well felt. Neurological Exam :  Awake alert  oriented x 3 normal speech and language. Mild left lower face asymmetry. Tongue midline. No drift. Mild diminished fine finger movements on left. Orbits right over left upper extremity. Mild left grip weak.. diminished left upper and lower extremity touch and pinprick sensation . Normal coordination.  ASSESSMENT/PLAN Mr. David Irwin is a 73 y.o. male with history of memory deficit, HTN, lupus, DM who code stroke was initiated in ED for c/o right sided weakness and facial droop.   Stroke:  right lateral thalamus infarct   secondary to small vessel disease.   CT head  No hemorrhage   CTA head & neck : no LVO; severe stenosis RICA  MRI  11 mm acute to subacute right lateral thalamus infarct  2D Echo   1. The left ventricle has normal systolic function with an ejection fraction of 60-65%. The cavity size was normal. There is moderate asymmetric left ventricular hypertrophy. Left ventricular diastolic Doppler parameters are consistent with impaired  relaxation Elevated left atrial and left ventricular end-diastolic pressures.  2. The right ventricle has normal systolic function. The cavity was normal. There is no increase in right ventricular wall thickness.  3. Left atrial size was mildly dilated.  4. The mitral valve is normal in structure.  5. The tricuspid valve is normal in structure.  6. The aortic valve is tricuspid Moderate thickening of the aortic valve Moderate calcification of the aortic valve. Aortic  valve regurgitation is mild to moderate by color flow Doppler.  7. The pulmonic valve was normal in structure.  8. Cannot rule out PFO on apical 4 chamber view. Recommend limited study with agitated saline contrast injection.  LDL 74  HgbA1c 8.5  ASA and plavix for VTE prophylaxis Diet Order            Diet Heart Room service appropriate? Yes; Fluid consistency: Thin  Diet effective now               aspirin 81 mg daily prior to admission, now on aspirin 81 mg daily and  clopidogrel 75 mg daily.  Continue Aspirin and Plavix for 3 weeks, then stop Aspirin and continue with plavix only.   Therapy recommendations:  pending  Disposition:  pending  Hypertension  Stable . Permissive hypertension (OK if < 220/120) but gradually normalize in 5-7 days . Long-term BP goal normotensive  Hyperlipidemia  Home meds:  simvastatin 20 mg daily,  resumed in hospital  LDL 74, goal < 70  Increase simvastatin to 40 mg daily  Continue statin at discharge  Diabetes type II  HgbA1c 8.5, goal < 7.0  unControlled  Other Stroke Risk Factors  Advanced age  Obesity, Body mass index is 34.32 kg/m., recommend weight loss, diet and exercise as appropriate   Other Active Problems  Early dementia  Hospital day # 0  Valentina Lucks, MSN, NP-C [facility] [phone] I have personally obtained history,examined this patient, reviewed notes, independently viewed imaging studies, participated in medical decision making and plan of care.ROS completed by me personally and pertinent positives fully documented  I have made any additions or clarifications directly to the above note. Agree with note above.he presented with left-sided numbness and difficulty walking due to his right thalamic lacunar infarct from small vessel disease. Recommend dual antiplatelet therapy for 3 weeks followed by Plavix alone. Aggressive risk factor modification. Continue ongoing stroke workup. Mobilize out of bed and therapy consults. Long discussion with patient and wife and answered questions. Greater than 50% time during this 35 minute visit was spent on counseling and coordination of care about his lacunar stroke and answered questions.  Delia Heady, MD Medical Director [facility] Pager: [phone] 12/08/2018 3:19 PM   To contact Stroke Continuity provider, please refer to [email or web address]. After hours, contact General Neurology

## 2018-12-08 NOTE — Progress Notes (Signed)
Patient arrived in the unit at 1930 pm , alert and oriented x2-3, has period of confusion, denied of pain, resting in a bed at this time, and will closely monitor.

## 2018-12-08 NOTE — Evaluation (Signed)
Physical Therapy Evaluation Patient Details Name: David Irwin MRN: [id number] DOB: [birth year] Today's Date: 12/08/2018   History of Present Illness  Pt is a 73 y/o male admitted secondary to slurred speech and LOB. MRI of the brain revealed an 4mm acute/early subacute in farct of the R lateral thalamus. PMH including but not limited to early dementia, DM, HTN and lupus.    Clinical Impression  Pt presented supine in bed with HOB elevated, awake and willing to participate in therapy session. Pt's spouse was present throughout session as well. Prior to admission, pt reported that he ambulated with use of a cane and was independent with ADLs. Pt is currently limited secondary to fatigue and generalized weakness. Pt would continue to benefit from skilled physical therapy services at this time while admitted and after d/c to address the below listed limitations in order to improve overall safety and independence with functional mobility.     Follow Up Recommendations SNF;Other (comment)(if pt/family refuses, will need HHPT and 24/7 supervision/A)    Equipment Recommendations  Rolling walker with 5" wheels    Recommendations for Other Services       Precautions / Restrictions Precautions Precautions: Fall Precaution Comments: possible seizure activity Restrictions Weight Bearing Restrictions: No      Mobility  Bed Mobility Overal bed mobility: Needs Assistance Bed Mobility: Sit to Supine;Supine to Sit     Supine to sit: Mod assist Sit to supine: Min guard   General bed mobility comments: increased time and effort, mod A for trunk elevation, use of bed rail  Transfers Overall transfer level: Needs assistance Equipment used: Rolling walker (2 wheeled) Transfers: Sit to/from Stand Sit to Stand: Min guard         General transfer comment: min guard for safety, cueing for hand placement  Ambulation/Gait Ambulation/Gait assistance: Min guard Gait Distance (Feet): 15  Feet(15' x2 with sitting break to toilet) Assistive device: Rolling walker (2 wheeled) Gait Pattern/deviations: Step-through pattern;Decreased stride length;Shuffle Gait velocity: decreased   General Gait Details: pt with slow, cautious, shuffling gait pattern with RW, cueing for safety with managing AD; pt very limited secondary to fatigue  Stairs            Wheelchair Mobility    Modified Rankin (Stroke Patients Only) Modified Rankin (Stroke Patients Only) Pre-Morbid Rankin Score: Moderate disability Modified Rankin: Moderately severe disability     Balance Overall balance assessment: Needs assistance Sitting-balance support: Feet supported Sitting balance-Leahy Scale: Good Sitting balance - Comments: static sitting balance with supervision for safety   Standing balance support: Bilateral upper extremity supported;Single extremity supported Standing balance-Leahy Scale: Poor                               Pertinent Vitals/Pain Pain Assessment: No/denies pain Faces Pain Scale: No hurt    Home Living Family/patient expects to be discharged to:: Private residence Living Arrangements: Spouse/significant other Available Help at Discharge: Family;Available 24 hours/day Type of Home: House Home Access: Stairs to enter Entrance Stairs-Rails: Doctor, general practice of Steps: 8 Home Layout: One level Home Equipment: Cane - single point      Prior Function Level of Independence: Independent with assistive device(s)         Comments: mod I with use of cane     Hand Dominance   Dominant Hand: Right    Extremity/Trunk Assessment   Upper Extremity Assessment Upper Extremity Assessment: Generalized weakness    Lower  Extremity Assessment Lower Extremity Assessment: Generalized weakness    Cervical / Trunk Assessment Cervical / Trunk Assessment: Kyphotic  Communication   Communication: Expressive difficulties  Cognition  Arousal/Alertness: Awake/alert Behavior During Therapy: WFL for tasks assessed/performed Overall Cognitive Status: Impaired/Different from baseline Area of Impairment: Following commands;Safety/judgement;Problem solving                       Following Commands: Follows one step commands with increased time Safety/Judgement: Decreased awareness of deficits;Decreased awareness of safety   Problem Solving: Slow processing;Decreased initiation;Difficulty sequencing;Requires verbal cues;Requires tactile cues        General Comments      Exercises     Assessment/Plan    PT Assessment Patient needs continued PT services  PT Problem List Decreased strength;Decreased activity tolerance;Decreased balance;Decreased mobility;Decreased coordination;Decreased cognition;Decreased knowledge of use of DME;Decreased safety awareness;Decreased knowledge of precautions       PT Treatment Interventions DME instruction;Stair training;Gait training;Functional mobility training;Therapeutic activities;Therapeutic exercise;Neuromuscular re-education;Balance training;Cognitive remediation;Patient/family education    PT Goals (Current goals can be found in the Care Plan section)  Acute Rehab PT Goals Patient Stated Goal: to get stronger PT Goal Formulation: With patient/family Time For Goal Achievement: 12/22/18 Potential to Achieve Goals: Good    Frequency Min 4X/week   Barriers to discharge        Co-evaluation               AM-PAC PT "6 Clicks" Mobility  Outcome Measure Help needed turning from your back to your side while in a flat bed without using bedrails?: A Lot Help needed moving from lying on your back to sitting on the side of a flat bed without using bedrails?: A Lot Help needed moving to and from a bed to a chair (including a wheelchair)?: A Little Help needed standing up from a chair using your arms (e.g., wheelchair or bedside chair)?: A Little Help needed to walk  in hospital room?: A Little Help needed climbing 3-5 steps with a railing? : Total 6 Click Score: 14    End of Session   Activity Tolerance: Patient limited by fatigue Patient left: in bed;with call bell/phone within reach;with bed alarm set;with family/visitor present Nurse Communication: Mobility status PT Visit Diagnosis: Other abnormalities of gait and mobility (R26.89);Muscle weakness (generalized) (M62.81)    Time: 2111-7356 PT Time Calculation (min) (ACUTE ONLY): 27 min   Charges:   PT Evaluation $PT Eval Moderate Complexity: 1 Mod PT Treatments $Therapeutic Activity: 8-22 mins        Deborah Chalk, PT, DPT  Acute Rehabilitation Services Pager [phone] Office [phone]    Alessandra Bevels Yareli Carthen 12/08/2018, 3:59 PM

## 2018-12-08 NOTE — Progress Notes (Signed)
Patient's wife reported that the patient got very confused and start to slur his words about 30 minutes ago. She also stated he started picking at his hands and drooling a small amount. Patient states he only feels decreased sensation on the left side. Unchanged NIH, VSS. Nurse educated about importance of calling nurse when this happens. Dr. Jomarie Longs notified. Nurse will continue to monitor. Tyron Dullea Dawnmarie Breon

## 2018-12-09 MED ORDER — ASPIRIN EC 81 MG PO TBEC: 81.0000 mg | DELAYED_RELEASE_TABLET | Freq: Every day | ORAL | 0 refills | Status: DC

## 2018-12-09 MED ORDER — MAGNESIUM OXIDE 400 (241.3 MG) MG PO TABS: 400.0000 mg | ORAL_TABLET | Freq: Two times a day (BID) | ORAL | 1 refills | Status: DC

## 2018-12-09 MED ORDER — CLOPIDOGREL BISULFATE 75 MG PO TABS
75.0000 mg | ORAL_TABLET | Freq: Every day | ORAL | Status: DC
  Administered 2018-12-09: 75 mg via ORAL
  Filled 2018-12-09: qty 1

## 2018-12-09 MED ORDER — CLOPIDOGREL BISULFATE 75 MG PO TABS: 75.0000 mg | ORAL_TABLET | Freq: Every day | ORAL | 11 refills | Status: AC

## 2018-12-09 MED ORDER — MAGNESIUM OXIDE 400 (241.3 MG) MG PO TABS
400.0000 mg | ORAL_TABLET | Freq: Two times a day (BID) | ORAL | Status: DC
  Administered 2018-12-09: 400 mg via ORAL
  Filled 2018-12-09: qty 1

## 2018-12-09 MED ORDER — OMEPRAZOLE 20 MG PO CPDR: 20.0000 mg | DELAYED_RELEASE_CAPSULE | Freq: Every day | ORAL | 1 refills | Status: DC

## 2018-12-09 NOTE — Care Management Note (Signed)
Case Management Note Hortencia Conradi, RN MSN CCM Transitions of Care 28M/29M CM [phone] (cross coverage)  Patient Details  Name: David Irwin MRN: [id number] Date of Birth: 27-Mar-1946  Subjective/Objective:   Acute ischemic R MCA stroke                Action/Plan: PTA home with wife. Spoke with patient and wife at bedside. Discussed plans to return home with HH-PT/OT/Aide. HH choice offered. Referral accepted by [facility]. [facility] to provide RW. Patient to f/u with PCP for hospital f/u. No other transition of care needs identified.   Expected Discharge Date:  12/09/18               Expected Discharge Plan:  Home w Home Health Services  In-House Referral:  Clinical Social Work  Discharge planning Services  CM Consult  Post Acute Care Choice:  Durable Medical Equipment, Home Health Choice offered to:  Spouse, Patient  DME Arranged:  Walker rolling DME Agency:  [facility]  HH Arranged:  PT, OT, Nurse's Aide HH Agency:  [facility]  Status of Service:  Completed, signed off  If discussed at Long Length of Stay Meetings, dates discussed:    Additional Comments:  David Kinds, RN 12/09/2018, 11:26 AM

## 2018-12-09 NOTE — Plan of Care (Signed)
  Problem: Education: Goal: Knowledge of patient specific risk factors addressed and post discharge goals established will improve Outcome: Progressing   Problem: Clinical Measurements: Goal: Ability to maintain clinical measurements within normal limits will improve Outcome: Progressing

## 2018-12-09 NOTE — Progress Notes (Signed)
STROKE TEAM PROGRESS NOTE   INTERVAL HISTORY Patient in bed, NAD. Patient states she's noticed improvement in his left-sided numbness and weakness. He has been seen by therapy would recommend home or outpatient therapy. 2-D echo shows normal ejection fraction and no cardiac source of embolism but PFO cannot be excluded. Patient stroke was clearly lacunar hence would not recommend checking TEE  Vitals:   12/08/18 2050 12/08/18 2352 12/09/18 0417 12/09/18 0801  BP: (!) 161/72 (!) 136/56 (!) 145/65 (!) 148/78  Pulse: 73 66 71 73  Resp: 18 18 17 20   Temp: 98.5 F (36.9 C) 98.4 F (36.9 C) (!) 97.5 F (36.4 C) 98.9 F (37.2 C)  TempSrc: Oral Oral Oral Oral  SpO2: 97% 94% 98% 100%  Weight:      Height:        CBC:  Recent Labs  Lab 12/07/18 1215  WBC 3.6*  NEUTROABS 1.7  HGB 12.3*  HCT 38.1*  MCV 96.5  PLT 129*    Basic Metabolic Panel:  Recent Labs  Lab 12/07/18 1215 12/07/18 1218 12/07/18 2327  NA 137  --  138  K 4.5  --  4.1  CL 105  --  106  CO2 24  --  25  GLUCOSE 173*  --  190*  BUN 21  --  20  CREATININE 1.57* 1.60* 1.51*  CALCIUM 9.2  --  9.0  MG  --   --  1.6*   Lipid Panel:     Component Value Date/Time   CHOL 119 12/08/2018 0504   TRIG 54 12/08/2018 0504   HDL 34 (L) 12/08/2018 0504   CHOLHDL 3.5 12/08/2018 0504   VLDL 11 12/08/2018 0504   LDLCALC 74 12/08/2018 0504   HgbA1c:  Lab Results  Component Value Date   HGBA1C 8.5 (H) 12/08/2018   Urine Drug Screen:     Component Value Date/Time   LABOPIA NONE DETECTED 12/07/2018 1335   COCAINSCRNUR NONE DETECTED 12/07/2018 1335   LABBENZ NONE DETECTED 12/07/2018 1335   AMPHETMU NONE DETECTED 12/07/2018 1335   THCU NONE DETECTED 12/07/2018 1335   LABBARB NONE DETECTED 12/07/2018 1335    Alcohol Level     Component Value Date/Time   ETH <10 12/07/2018 1214    IMAGING Mr Brain Wo Contrast  Result Date: 12/07/2018 CLINICAL DATA:  73 y/o  M; left-sided weakness and facial droop. EXAM: MRI  HEAD WITHOUT CONTRAST TECHNIQUE: Multiplanar, multiecho pulse sequences of the brain and surrounding structures were obtained without intravenous contrast. COMPARISON:  12/07/2018 CT head and CTA head.  08/18/2014 MRI head. FINDINGS: Brain: Motion degradation of multiple pulse sequences. 11 mm focus of reduced diffusion within the right lateral thalamus compatible with acute/early subacute infarction. No associated hemorrhage or mass effect. Probable additional punctate focus of infarction within left frontal cortex (series 3, image 33). Multiple small chronic infarcts are present within the cerebellum, right frontal cortex, left parietal cortex, and bilateral occipital lobes. Confluent nonspecific T2 FLAIR hyperintensities in subcortical and periventricular white matter are compatible with moderate to severe chronic microvascular ischemic changes. Moderate volume loss of the brain. Vascular: Normal flow voids. Skull and upper cervical spine: Normal marrow signal. Sinuses/Orbits: Negative. Other: Bilateral intra-ocular lens replacement. IMPRESSION: 1. 11 mm acute/early subacute infarction within the right lateral thalamus. No associated hemorrhage or mass effect. Probable additional punctate focus of infarction in left frontal cortex. 2. Moderate to severe chronic microvascular ischemic changes and moderate volume loss of the brain well as multiple small chronic  infarcts in the brain with progression from 2015. These results were called by telephone at the time of interpretation on 12/07/2018 at 3:20 pm to Dr. Milon Dikes , who verbally acknowledged these results. Electronically Signed   By: Mitzi Hansen M.D.   On: 12/07/2018 15:21    PHYSICAL EXAM : Pleasant elderly African-American male currently not in distress. Mildly obese. . Afebrile. Head is nontraumatic. Neck is supple without bruit.    Cardiac exam no murmur or gallop. Lungs are clear to auscultation. Distal pulses are well  felt. Neurological Exam :  Awake alert oriented x 3 normal speech and language. Mild left lower face asymmetry. Tongue midline. No drift. Mild diminished fine finger movements on left. Orbits right over left upper extremity. Mild left grip weak.. normal sensation . Normal coordination.  ASSESSMENT/PLAN Mr. David Irwin is a 73 y.o. male with history of memory deficit, HTN, lupus, DM who code stroke was initiated in ED for c/o right sided weakness and facial droop.   Stroke:  right lateral thalamus infarct   secondary to small vessel disease.   CT head  No hemorrhage   CTA head & neck : no LVO; severe stenosis RICA  MRI  11 mm acute to subacute right lateral thalamus infarct  2D Echo   1. The left ventricle has normal systolic function with an ejection fraction of 60-65%. The cavity size was normal. There is moderate asymmetric left ventricular hypertrophy. Left ventricular diastolic Doppler parameters are consistent with impaired  relaxation Elevated left atrial and left ventricular end-diastolic pressures.  2. The right ventricle has normal systolic function. The cavity was normal. There is no increase in right ventricular wall thickness.  3. Left atrial size was mildly dilated.  4. The mitral valve is normal in structure.  5. The tricuspid valve is normal in structure.  6. The aortic valve is tricuspid Moderate thickening of the aortic valve Moderate calcification of the aortic valve. Aortic valve regurgitation is mild to moderate by color flow Doppler.  7. The pulmonic valve was normal in structure.  8. Cannot rule out PFO on apical 4 chamber view. Recommend limited study with agitated saline contrast injection.  LDL 74  HgbA1c 8.5  ASA and plavix for VTE prophylaxis Diet Order            Diet Heart Room service appropriate? Yes; Fluid consistency: Thin  Diet effective now              aspirin 81 mg daily prior to admission, now on aspirin 81 mg daily and clopidogrel 75 mg  daily.  Continue Aspirin and Plavix for 3 weeks, then stop Aspirin and continue with plavix only.   Therapy recommendations:  Home health Disposition:  Home Hypertension  Stable . Permissive hypertension (OK if < 220/120) but gradually normalize in 5-7 days . Long-term BP goal normotensive  Hyperlipidemia  Home meds:  simvastatin 20 mg daily,  resumed in hospital  LDL 74, goal < 70  Increase simvastatin to 40 mg daily  Continue statin at discharge  Diabetes type II  HgbA1c 8.5, goal < 7.0  unControlled  Other Stroke Risk Factors  Advanced age  Obesity, Body mass index is 34.32 kg/m., recommend weight loss, diet and exercise as appropriate   Other Active Problems  Early dementia  Hospital day # 1  He presented with left-sided numbness and difficulty walking due to his right thalamic lacunar infarct from small vessel disease. Recommend dual antiplatelet therapy for 3 weeks followed  by Plavix alone. Aggressive risk factor modification. -patient home. Follow-up as an outpatient in the stroke clinic in 6 weeks. Long discussion with patient and wife and Dr. Susie Cassette and answered questions. Greater than 50% time during this 25 minute visit was spent on counseling and coordination of care about his lacunar stroke and answered questions.  Delia Heady, MD Medical Director [facility] Pager: [phone] 12/09/2018 2:01 PM   To contact Stroke Continuity provider, please refer to [email or web address]. After hours, contact General Neurology

## 2018-12-09 NOTE — ED Provider Notes (Signed)
[facility] PROGRESSIVE CARE Provider Note   CSN: [id number] Arrival date & time: 12/07/18  1204    History   Chief Complaint Chief Complaint  Patient presents with  . Code Stroke    HPI David Irwin is a 73 y.o. male.     HPI 73 year old male who was brought to the emergency department after developing new unilateral arm weakness and facial droop while at his doctor's office today.  This occurred abruptly at approximately 10:45 AM.  Patient denies headache at this time.  Questionable left-sided facial droop.  He was going to the doctor for left shoulder and arm pain.  His speech was slurred at the clinic and thus he was brought to the emergency department immediately by his wife.  He was seen prior to my evaluation in triage and a code stroke was called.  At time of my evaluation the patient's speech seems to be improving.  He has no focal arm weakness at this time.  His speech is slow.  It is reported that he has some history of memory impairment and change in his ADLs over the past 1 to 2 years.  Patient denies chest pain shortness of breath.  Denies significant headache at this time.   Past Medical History:  Diagnosis Date  . Diabetes mellitus without complication (HCC)   . Hypertension   . Lupus (HCC)   . Memory loss   . Murmur, cardiac   . Nocturia     Patient Active Problem List   Diagnosis Date Noted  . Acute ischemic right MCA stroke (HCC) 12/07/2018  . Stroke (HCC) 12/07/2018  . Moderate dementia without behavioral disturbance (HCC) 07/27/2018  . New onset seizure (HCC) 07/27/2018  . Depression 12/16/2017  . Essential hypertension 03/26/2017  . Hyperlipidemia 03/26/2017  . Cardiac murmur 03/26/2017  . Mild cognitive impairment, so stated 05/01/2013  . Memory loss 05/01/2013    Past Surgical History:  Procedure Laterality Date  . APPENDECTOMY    . EYE SURGERY          Home Medications    Prior to Admission medications   Medication Sig Start  Date End Date Taking? Authorizing Provider  acetaminophen (TYLENOL) 500 MG tablet Take 500 mg by mouth every 6 (six) hours as needed for headache (pain).   Yes [provider]  aspirin EC 81 MG tablet Take 81 mg by mouth daily.   Yes [provider]  clobetasol cream (TEMOVATE) 0.05 % Apply 1 application topically as needed (break out lupus reaction on scalp).   Yes [provider]  fluticasone (FLONASE) 50 MCG/ACT nasal spray Place 2 sprays into both nostrils at bedtime as needed for allergies or rhinitis.  07/06/18  Yes [provider]  hydroxychloroquine (PLAQUENIL) 200 MG tablet Take 200 mg by mouth 2 (two) times daily.    Yes [provider]  ibuprofen (ADVIL) 200 MG tablet Take 600 mg by mouth every 6 (six) hours as needed for mild pain or moderate pain.   Yes [provider]  levETIRAcetam (KEPPRA) 500 MG tablet Take 1 tablet (500 mg total) by mouth 2 (two) times daily. 07/27/18  Yes Penumalli, Glenford Bayley, MD  Multiple Vitamins-Minerals (ALIVE MENS ENERGY PO) Take 1 tablet by mouth daily. Gummie   Yes [provider]  polyvinyl alcohol (ARTIFICIAL TEARS) 1.4 % ophthalmic solution Place 1 drop into both eyes daily as needed for dry eyes.   Yes [provider]  potassium chloride SA (K-DUR,KLOR-CON) 20 MEQ  tablet Take 20 mEq by mouth daily.    Yes [provider]  PRESCRIPTION MEDICATION Inhale into the lungs at bedtime. CPAP   Yes [provider]  simvastatin (ZOCOR) 20 MG tablet Take 20 mg by mouth at bedtime.    Yes [provider]  tamsulosin (FLOMAX) 0.4 MG CAPS Take 0.4 mg by mouth daily.  04/27/13  Yes [provider]  telmisartan-hydrochlorothiazide (MICARDIS HCT) 40-12.5 MG tablet Take 2 tablets by mouth daily. 07/08/18  Yes [provider]  vitamin B-12 (CYANOCOBALAMIN) 1000 MCG tablet Take 1,000 mcg by mouth daily.   Yes [provider]    Family History Family  History  Problem Relation Age of Onset  . Kidney disease Mother   . Dementia Father     Social History Social History   Tobacco Use  . Smoking status: Never Smoker  . Smokeless tobacco: Never Used  Substance Use Topics  . Alcohol use: No  . Drug use: No     Allergies   Liraglutide and Shellfish allergy   Review of Systems Review of Systems  All other systems reviewed and are negative.    Physical Exam Updated Vital Signs BP (!) 145/65 (BP Location: Right Arm)   Pulse 71   Temp (!) 97.5 F (36.4 C) (Oral)   Resp 17   Ht 5\' 10"  (1.778 m)   Wt 108.5 kg   SpO2 98%   BMI 34.32 kg/m   Physical Exam Vitals signs and nursing note reviewed.  Constitutional:      Appearance: He is well-developed.  HENT:     Head: Normocephalic and atraumatic.  Eyes:     Pupils: Pupils are equal, round, and reactive to light.  Neck:     Musculoskeletal: Normal range of motion.  Cardiovascular:     Rate and Rhythm: Normal rate and regular rhythm.     Heart sounds: Normal heart sounds.  Pulmonary:     Effort: Pulmonary effort is normal. No respiratory distress.     Breath sounds: Normal breath sounds.  Abdominal:     General: There is no distension.     Palpations: Abdomen is soft.     Tenderness: There is no abdominal tenderness.  Musculoskeletal: Normal range of motion.  Skin:    General: Skin is warm and dry.  Neurological:     General: No focal deficit present.     Mental Status: He is alert. Mental status is at baseline.     Comments: 5/5 strength in major muscle groups of  bilateral upper and lower extremities. Speech normal. No facial asymetry.   Psychiatric:        Judgment: Judgment normal.      ED Treatments / Results  Labs (all labs ordered are listed, but only abnormal results are displayed) Labs Reviewed  APTT - Abnormal; Notable for the following components:      Result Value   aPTT 49 (*)    All other components within normal limits  CBC - Abnormal;  Notable for the following components:   WBC 3.6 (*)    RBC 3.95 (*)    Hemoglobin 12.3 (*)    HCT 38.1 (*)    RDW 11.2 (*)    Platelets 129 (*)    All other components within normal limits  COMPREHENSIVE METABOLIC PANEL - Abnormal; Notable for the following components:   Glucose, Bld 173 (*)    Creatinine, Ser 1.57 (*)    GFR calc non Af Amer 44 (*)  GFR calc Af Amer 51 (*)    All other components within normal limits  URINALYSIS, ROUTINE W REFLEX MICROSCOPIC - Abnormal; Notable for the following components:   Color, Urine STRAW (*)    All other components within normal limits  HEMOGLOBIN A1C - Abnormal; Notable for the following components:   Hgb A1c MFr Bld 8.5 (*)    All other components within normal limits  LIPID PANEL - Abnormal; Notable for the following components:   HDL 34 (*)    All other components within normal limits  BASIC METABOLIC PANEL - Abnormal; Notable for the following components:   Glucose, Bld 190 (*)    Creatinine, Ser 1.51 (*)    GFR calc non Af Amer 46 (*)    GFR calc Af Amer 53 (*)    All other components within normal limits  MAGNESIUM - Abnormal; Notable for the following components:   Magnesium 1.6 (*)    All other components within normal limits  I-STAT CREATININE, ED - Abnormal; Notable for the following components:   Creatinine, Ser 1.60 (*)    All other components within normal limits  CBG MONITORING, ED - Abnormal; Notable for the following components:   Glucose-Capillary 144 (*)    All other components within normal limits  ETHANOL  PROTIME-INR  DIFFERENTIAL  RAPID URINE DRUG SCREEN, HOSP PERFORMED  I-STAT CREATININE, ED    EKG EKG Interpretation  Date/Time:  Wednesday December 07 2018 12:56:13 EST Ventricular Rate:  66 PR Interval:    QRS Duration: 92 QT Interval:  400 QTC Calculation: 420 R Axis:   21 Text Interpretation:  Sinus rhythm Borderline prolonged PR interval Low voltage, precordial leads No significant change since  last tracing Confirmed by Jacalyn Lefevre [id number]) on 12/08/2018 2:10:43 PM   Radiology Ct Angio Head W Or Wo Contrast  Result Date: 12/07/2018 CLINICAL DATA:  73 year old male code stroke. Left side weakness and facial droop last seen normal at 1030 hours. EXAM: CT ANGIOGRAPHY HEAD AND NECK TECHNIQUE: Multidetector CT imaging of the head and neck was performed using the standard protocol during bolus administration of intravenous contrast. Multiplanar CT image reconstructions and MIPs were obtained to evaluate the vascular anatomy. Carotid stenosis measurements (when applicable) are obtained utilizing NASCET criteria, using the distal internal carotid diameter as the denominator. CONTRAST:  75mL OMNIPAQUE IOHEXOL 350 MG/ML SOLN COMPARISON:  Head CT without contrast 1221 hours today. Brain MRI and intracranial MRA 08/18/2014. FINDINGS: CTA NECK Skeleton: Negative. Absent dentition. Upper chest: Mild atelectasis in the upper lungs. No superior mediastinal lymphadenopathy. Other neck: Negative. Aortic arch: Aberrant origin of the right subclavian artery. Mild aortic arch calcified plaque. Right carotid system: Minimal plaque in the right CCA proximal to the bifurcation. Soft and calcified plaque at the right bifurcation and ICA origin without stenosis. Negative cervical right ICA. Left carotid system: Left CCA origin mildly obscured by dense left subclavian vein contrast. Mildly tortuous proximal left CCA. Mild calcified plaque proximal to the bifurcation without stenosis. Bulky calcified plaque at the left ICA origin resulting in 50 % stenosis with respect to the distal vessel. Negative cervical left ICA otherwise. Vertebral arteries: Aberrant origin of the right subclavian artery with calcified plaque but no stenosis. The right vertebral artery is non dominant and seems to arise from the right CCA rather than the right subclavian on series 7, image 278. It has a late entry into the cervical transverse foramen and  remains diminutive but patent to the skull base. No proximal  left subclavian artery stenosis. Minimal calcified plaque at the. Tortuous left V1 left vertebral artery origin without stenosis segment. The left vertebral is dominant and patent to the skull base without stenosis. CTA HEAD Posterior circulation: Diminutive non dominant right vertebral artery remains patent to the vertebrobasilar junction. The left vertebral artery is dominant and supplies the basilar with a normal left PICA origin. Patent basilar artery without stenosis. Normal SCA and left PCA origins. Fetal type right PCA origin. The left posterior communicating artery is also present. Mild bilateral PCA tortuosity. Visible PCA branches appear within normal limits. Anterior circulation: Patent ICA siphons. The left siphon is calcified through the cavernous and supraclinoid segments with mild stenosis. The right siphon demonstrates similar extensive calcification, but with moderate to severe supraclinoid segment stenosis as seen on series 7, image 99 and series 9, image 84. Normal posterior communicating artery origins. Patent carotid termini. Normal MCA and ACA origins. Diminutive or absent anterior communicating artery. Bilateral ACA branches are within normal limits. The left MCA M1 segment is tortuous. The left MCA bifurcation and left MCA branches are within normal limits. The right MCA M1 segment and bifurcation are patent without stenosis. Right MCA branches are within normal limits. Venous sinuses: Early contrast timing limits evaluation. Anatomic variants: Aberrant right subclavian artery origin. Dominant left vertebral artery. Non dominant and diminutive right vertebral artery which seems to arise from the right CCA rather than the subclavian, and remains diminutive to the vertebrobasilar junction. Fetal type right PCA origin. Review of the MIP images confirms the above findings IMPRESSION: 1. Negative for large vessel occlusion. 2. Positive  for moderate to severe Right ICA supraclinoid segment stenosis due to calcified plaque. Left ICA siphon and bilateral carotid bifurcation atherosclerosis without significant stenosis. 3. 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 The non dominant right vertebral artery is diminutive and appears to arise from the right CCA rather than the right subclavian. Salient findings communicated to Dr. Laurence Slate at 1:04 pmon 3/4/2020by text page via the [facility] messaging system. Electronically Signed   By: Odessa Fleming M.D.   On: 12/07/2018 13:06   Ct Angio Neck W Or Wo Contrast  Result Date: 12/07/2018 CLINICAL DATA:  73 year old male code stroke. Left side weakness and facial droop last seen normal at 1030 hours. EXAM: CT ANGIOGRAPHY HEAD AND NECK TECHNIQUE: Multidetector CT imaging of the head and neck was performed using the standard protocol during bolus administration of intravenous contrast. Multiplanar CT image reconstructions and MIPs were obtained to evaluate the vascular anatomy. Carotid stenosis measurements (when applicable) are obtained utilizing NASCET criteria, using the distal internal carotid diameter as the denominator. CONTRAST:  37mL OMNIPAQUE IOHEXOL 350 MG/ML SOLN COMPARISON:  Head CT without contrast 1221 hours today. Brain MRI and intracranial MRA 08/18/2014. FINDINGS: CTA NECK Skeleton: Negative. Absent dentition. Upper chest: Mild atelectasis in the upper lungs. No superior mediastinal lymphadenopathy. Other neck: Negative. Aortic arch: Aberrant origin of the right subclavian artery. Mild aortic arch calcified plaque. Right carotid system: Minimal plaque in the right CCA proximal to the bifurcation. Soft and calcified plaque at the right bifurcation and ICA origin without stenosis. Negative cervical right ICA. Left carotid system: Left CCA origin mildly obscured by dense left subclavian vein contrast. Mildly tortuous proximal left CCA. Mild  calcified plaque proximal to the bifurcation without stenosis. Bulky calcified plaque at the left ICA origin resulting in 50 % stenosis with respect to the distal vessel. Negative cervical left  ICA otherwise. Vertebral arteries: Aberrant origin of the right subclavian artery with calcified plaque but no stenosis. The right vertebral artery is non dominant and seems to arise from the right CCA rather than the right subclavian on series 7, image 278. It has a late entry into the cervical transverse foramen and remains diminutive but patent to the skull base. No proximal left subclavian artery stenosis. Minimal calcified plaque at the. Tortuous left V1 left vertebral artery origin without stenosis segment. The left vertebral is dominant and patent to the skull base without stenosis. CTA HEAD Posterior circulation: Diminutive non dominant right vertebral artery remains patent to the vertebrobasilar junction. The left vertebral artery is dominant and supplies the basilar with a normal left PICA origin. Patent basilar artery without stenosis. Normal SCA and left PCA origins. Fetal type right PCA origin. The left posterior communicating artery is also present. Mild bilateral PCA tortuosity. Visible PCA branches appear within normal limits. Anterior circulation: Patent ICA siphons. The left siphon is calcified through the cavernous and supraclinoid segments with mild stenosis. The right siphon demonstrates similar extensive calcification, but with moderate to severe supraclinoid segment stenosis as seen on series 7, image 99 and series 9, image 84. Normal posterior communicating artery origins. Patent carotid termini. Normal MCA and ACA origins. Diminutive or absent anterior communicating artery. Bilateral ACA branches are within normal limits. The left MCA M1 segment is tortuous. The left MCA bifurcation and left MCA branches are within normal limits. The right MCA M1 segment and bifurcation are patent without stenosis.  Right MCA branches are within normal limits. Venous sinuses: Early contrast timing limits evaluation. Anatomic variants: Aberrant right subclavian artery origin. Dominant left vertebral artery. Non dominant and diminutive right vertebral artery which seems to arise from the right CCA rather than the subclavian, and remains diminutive to the vertebrobasilar junction. Fetal type right PCA origin. Review of the MIP images confirms the above findings IMPRESSION: 1. Negative for large vessel occlusion. 2. Positive for moderate to severe Right ICA supraclinoid segment stenosis due to calcified plaque. Left ICA siphon and bilateral carotid bifurcation atherosclerosis without significant stenosis. 3. 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 The non dominant right vertebral artery is diminutive and appears to arise from the right CCA rather than the right subclavian. Salient findings communicated to Dr. Laurence Slate at 1:04 pmon 3/4/2020by text page via the [facility] messaging system. Electronically Signed   By: Odessa Fleming M.D.   On: 12/07/2018 13:06   Mr Brain Wo Contrast  Result Date: 12/07/2018 CLINICAL DATA:  73 y/o  M; left-sided weakness and facial droop. EXAM: MRI HEAD WITHOUT CONTRAST TECHNIQUE: Multiplanar, multiecho pulse sequences of the brain and surrounding structures were obtained without intravenous contrast. COMPARISON:  12/07/2018 CT head and CTA head.  08/18/2014 MRI head. FINDINGS: Brain: Motion degradation of multiple pulse sequences. 11 mm focus of reduced diffusion within the right lateral thalamus compatible with acute/early subacute infarction. No associated hemorrhage or mass effect. Probable additional punctate focus of infarction within left frontal cortex (series 3, image 33). Multiple small chronic infarcts are present within the cerebellum, right frontal cortex, left parietal cortex, and bilateral occipital lobes. Confluent nonspecific T2  FLAIR hyperintensities in subcortical and periventricular white matter are compatible with moderate to severe chronic microvascular ischemic changes. Moderate volume loss of the brain. Vascular: Normal flow voids. Skull and upper cervical spine: Normal marrow signal. Sinuses/Orbits: Negative. Other: Bilateral intra-ocular lens replacement. IMPRESSION: 1. 11  mm acute/early subacute infarction within the right lateral thalamus. No associated hemorrhage or mass effect. Probable additional punctate focus of infarction in left frontal cortex. 2. Moderate to severe chronic microvascular ischemic changes and moderate volume loss of the brain well as multiple small chronic infarcts in the brain with progression from 2015. These results were called by telephone at the time of interpretation on 12/07/2018 at 3:20 pm to Dr. Milon Dikes , who verbally acknowledged these results. Electronically Signed   By: Mitzi Hansen M.D.   On: 12/07/2018 15:21   Ct Head Code Stroke Wo Contrast  Result Date: 12/07/2018 CLINICAL DATA:  Code stroke. Left-sided weakness and facial droop. Last seen normal 1030 hours EXAM: CT HEAD WITHOUT CONTRAST TECHNIQUE: Contiguous axial images were obtained from the base of the skull through the vertex without intravenous contrast. COMPARISON:  CT head 07/25/2018 FINDINGS: Brain: Mild to moderate atrophy. Chronic infarct right frontal lobe unchanged. Small chronic infarct left occipital lobe unchanged. Chronic microvascular ischemia in the white matter. Benign-appearing calcification right pons is unchanged. Negative for acute infarct, hemorrhage, or mass Vascular: Negative for hyperdense vessel Skull: Negative Sinuses/Orbits: Paranasal sinuses clear.  Negative orbit Other: None ASPECTS ([place] Stroke Program Early CT Score) - Ganglionic level infarction (caudate, lentiform nuclei, internal capsule, insula, M1-M3 cortex): 7 - Supraganglionic infarction (M4-M6 cortex): 3 Total score (0-10 with  10 being normal): 10 IMPRESSION: 1. No acute intracranial abnormality. 2. ASPECTS is 10 Electronically Signed   By: Marlan Palau M.D.   On: 12/07/2018 12:29    Procedures .Critical Care Performed by: Azalia Bilis, MD Authorized by: Azalia Bilis, MD   Critical care provider statement:    Critical care time (minutes):  32   Critical care was time spent personally by me on the following activities:  Discussions with consultants, evaluation of patient's response to treatment, examination of patient, ordering and performing treatments and interventions, ordering and review of laboratory studies, ordering and review of radiographic studies, pulse oximetry, re-evaluation of patient's condition, obtaining history from patient or surrogate and review of old charts   (including critical care time)  Medications Ordered in ED Medications  acetaminophen (TYLENOL) tablet 500 mg (500 mg Oral Given 12/07/18 2319)  aspirin EC tablet 81 mg (81 mg Oral Given 12/08/18 0929)  hydroxychloroquine (PLAQUENIL) tablet 200 mg (200 mg Oral Given 12/08/18 2243)  simvastatin (ZOCOR) tablet 20 mg (20 mg Oral Given 12/08/18 2243)  tamsulosin (FLOMAX) capsule 0.4 mg (0.4 mg Oral Given 12/08/18 0929)  vitamin B-12 (CYANOCOBALAMIN) tablet 1,000 mcg (1,000 mcg Oral Given 12/08/18 0929)  levETIRAcetam (KEPPRA) tablet 500 mg (500 mg Oral Given 12/08/18 2243)  potassium chloride SA (K-DUR,KLOR-CON) CR tablet 20 mEq (20 mEq Oral Given 12/08/18 0929)  polyvinyl alcohol (LIQUIFILM TEARS) 1.4 % ophthalmic solution 1 drop (has no administration in time range)  enoxaparin (LOVENOX) injection 50 mg (50 mg Subcutaneous Given 12/08/18 2244)  iohexol (OMNIPAQUE) 350 MG/ML injection 75 mL (75 mLs Intravenous Contrast Given 12/07/18 1241)   stroke: mapping our early stages of recovery book ( Does not apply Given 12/07/18 2155)  cyclobenzaprine (FLEXERIL) tablet 5 mg (5 mg Oral Given 12/07/18 2318)  magnesium sulfate IVPB 2 g 50 mL (2 g Intravenous New  Bag/Given 12/08/18 0057)     Initial Impression / Assessment and Plan / ED Course  I have reviewed the triage vital signs and the nursing notes.  Pertinent labs & imaging results that were available during my care of the patient were reviewed by me  and considered in my medical decision making (see chart for details).       Code stroke on arrival.  Seen and evaluated by myself in the stroke team in the CT scanner.  Patient symptoms seem to be improving.  Work-up to continue while in the emergency department.  We will continue to follow closely.  No chest pain or shortness of breath  Initial CT imaging without acute pathology.  Stat MRI demonstrates acute to subacute thalamic stroke.  Patient be admitted to hospital.  Stroke MD and team to follow.  Hospitalist admission.  Patient and family updated.  Final Clinical Impressions(s) / ED Diagnoses   Final diagnoses:  Cerebrovascular accident (CVA), unspecified mechanism [facility](HCC)    ED Discharge Orders    None       Azalia Bilisampos, Yash Cacciola, MD 12/09/18 [phone]

## 2018-12-09 NOTE — Discharge Summary (Signed)
Physician Discharge Summary  David Irwin MRN: [id number] DOB/AGE: [birth year] 71 y.o.  PCP: Daisy Floro, MD   Admit date: 12/07/2018 Discharge date: 12/09/2018  Discharge Diagnoses:    Principal Problem:   Acute ischemic right MCA stroke [facility]) Active Problems:   Mild cognitive impairment, so stated   Essential hypertension   Hyperlipidemia   Moderate dementia without behavioral disturbance (HCC)   New onset seizure (HCC)   Stroke (HCC)    Follow-up recommendations Follow-up with PCP in 3-5 days , including all  additional recommended appointments as below Follow-up CBC, CMP in 3-5 days Delia Heady, MD Recommends dual antiplatelet therapy for 3 weeks followed by Plavix alone Follow-up with neurology in 3-4 weeks Patient is being discharged with home health, declined SNF     Allergies as of 12/09/2018      Reactions   Liraglutide Other (See Comments)   hallucinations    Shellfish Allergy Hives      Medication List    STOP taking these medications   Advil 200 MG tablet Generic drug:  ibuprofen     TAKE these medications   acetaminophen 500 MG tablet Commonly known as:  TYLENOL Take 500 mg by mouth every 6 (six) hours as needed for headache (pain).   ALIVE MENS ENERGY PO Take 1 tablet by mouth daily. Gummie   Artificial Tears 1.4 % ophthalmic solution Generic drug:  polyvinyl alcohol Place 1 drop into both eyes daily as needed for dry eyes.   aspirin EC 81 MG tablet Take 1 tablet (81 mg total) by mouth daily.   clobetasol cream 0.05 % Commonly known as:  TEMOVATE Apply 1 application topically as needed (break out lupus reaction on scalp).   clopidogrel 75 MG tablet Commonly known as:  PLAVIX Take 1 tablet (75 mg total) by mouth daily.   fluticasone 50 MCG/ACT nasal spray Commonly known as:  FLONASE Place 2 sprays into both nostrils at bedtime as needed for allergies or rhinitis.   hydroxychloroquine 200 MG tablet Commonly known as:   PLAQUENIL Take 200 mg by mouth 2 (two) times daily.   levETIRAcetam 500 MG tablet Commonly known as:  KEPPRA Take 1 tablet (500 mg total) by mouth 2 (two) times daily.   magnesium oxide 400 (241.3 Mg) MG tablet Commonly known as:  MAG-OX Take 1 tablet (400 mg total) by mouth 2 (two) times daily.   omeprazole 20 MG capsule Commonly known as:  PriLOSEC Take 1 capsule (20 mg total) by mouth daily.   potassium chloride SA 20 MEQ tablet Commonly known as:  K-DUR,KLOR-CON Take 20 mEq by mouth daily.   PRESCRIPTION MEDICATION Inhale into the lungs at bedtime. CPAP   simvastatin 20 MG tablet Commonly known as:  ZOCOR Take 20 mg by mouth at bedtime.   tamsulosin 0.4 MG Caps capsule Commonly known as:  FLOMAX Take 0.4 mg by mouth daily.   telmisartan-hydrochlorothiazide 40-12.5 MG tablet Commonly known as:  MICARDIS HCT Take 2 tablets by mouth daily.   vitamin B-12 1000 MCG tablet Commonly known as:  CYANOCOBALAMIN Take 1,000 mcg by mouth daily.            Durable Medical Equipment  (From admission, onward)         Start     Ordered   12/09/18 0850  For home use only DME Walker rolling  Once    Question:  Patient needs a walker to treat with the following condition  Answer:  Stroke (cerebrum) (HCC)  12/09/18 0850             Discharge Condition:  stable  Discharge Instructions Get Medicines reviewed and adjusted: Please take all your medications with you for your next visit with your Primary MD  Please request your Primary MD to go over all hospital tests and procedure/radiological results at the follow up, please ask your Primary MD to get all Hospital records sent to his/her office.  If you experience worsening of your admission symptoms, develop shortness of breath, life threatening emergency, suicidal or homicidal thoughts you must seek medical attention immediately by calling 911 or calling your MD immediately  if symptoms less severe.  You must read  complete instructions/literature along with all the possible adverse reactions/side effects for all the Medicines you take and that have been prescribed to you. Take any new Medicines after you have completely understood and accpet all the possible adverse reactions/side effects.   Do not drive when taking Pain medications.   Do not take more than prescribed Pain, Sleep and Anxiety Medications  Special Instructions: If you have smoked or chewed Tobacco  in the last 2 yrs please stop smoking, stop any regular Alcohol  and or any Recreational drug use.  Wear Seat belts while driving.  Please note  You were cared for by a hospitalist during your hospital stay. Once you are discharged, your primary care physician will handle any further medical issues. Please note that NO REFILLS for any discharge medications will be authorized once you are discharged, as it is imperative that you return to your primary care physician (or establish a relationship with a primary care physician if you do not have one) for your aftercare needs so that they can reassess your need for medications and monitor your lab values.     Allergies  Allergen Reactions  . Liraglutide Other (See Comments)    hallucinations   . Shellfish Allergy Hives      Disposition: Discharge disposition: 01-Home or Self Care        Consults:  neurology    Significant Diagnostic Studies:  Ct Angio Head W Or Wo Contrast  Result Date: 12/07/2018 CLINICAL DATA:  73 year old male code stroke. Left side weakness and facial droop last seen normal at 1030 hours. EXAM: CT ANGIOGRAPHY HEAD AND NECK TECHNIQUE: Multidetector CT imaging of the head and neck was performed using the standard protocol during bolus administration of intravenous contrast. Multiplanar CT image reconstructions and MIPs were obtained to evaluate the vascular anatomy. Carotid stenosis measurements (when applicable) are obtained utilizing NASCET criteria, using the  distal internal carotid diameter as the denominator. CONTRAST:  75mL OMNIPAQUE IOHEXOL 350 MG/ML SOLN COMPARISON:  Head CT without contrast 1221 hours today. Brain MRI and intracranial MRA 08/18/2014. FINDINGS: CTA NECK Skeleton: Negative. Absent dentition. Upper chest: Mild atelectasis in the upper lungs. No superior mediastinal lymphadenopathy. Other neck: Negative. Aortic arch: Aberrant origin of the right subclavian artery. Mild aortic arch calcified plaque. Right carotid system: Minimal plaque in the right CCA proximal to the bifurcation. Soft and calcified plaque at the right bifurcation and ICA origin without stenosis. Negative cervical right ICA. Left carotid system: Left CCA origin mildly obscured by dense left subclavian vein contrast. Mildly tortuous proximal left CCA. Mild calcified plaque proximal to the bifurcation without stenosis. Bulky calcified plaque at the left ICA origin resulting in 50 % stenosis with respect to the distal vessel. Negative cervical left ICA otherwise. Vertebral arteries: Aberrant origin of the right subclavian artery with  calcified plaque but no stenosis. The right vertebral artery is non dominant and seems to arise from the right CCA rather than the right subclavian on series 7, image 278. It has a late entry into the cervical transverse foramen and remains diminutive but patent to the skull base. No proximal left subclavian artery stenosis. Minimal calcified plaque at the. Tortuous left V1 left vertebral artery origin without stenosis segment. The left vertebral is dominant and patent to the skull base without stenosis. CTA HEAD Posterior circulation: Diminutive non dominant right vertebral artery remains patent to the vertebrobasilar junction. The left vertebral artery is dominant and supplies the basilar with a normal left PICA origin. Patent basilar artery without stenosis. Normal SCA and left PCA origins. Fetal type right PCA origin. The left posterior communicating  artery is also present. Mild bilateral PCA tortuosity. Visible PCA branches appear within normal limits. Anterior circulation: Patent ICA siphons. The left siphon is calcified through the cavernous and supraclinoid segments with mild stenosis. The right siphon demonstrates similar extensive calcification, but with moderate to severe supraclinoid segment stenosis as seen on series 7, image 99 and series 9, image 84. Normal posterior communicating artery origins. Patent carotid termini. Normal MCA and ACA origins. Diminutive or absent anterior communicating artery. Bilateral ACA branches are within normal limits. The left MCA M1 segment is tortuous. The left MCA bifurcation and left MCA branches are within normal limits. The right MCA M1 segment and bifurcation are patent without stenosis. Right MCA branches are within normal limits. Venous sinuses: Early contrast timing limits evaluation. Anatomic variants: Aberrant right subclavian artery origin. Dominant left vertebral artery. Non dominant and diminutive right vertebral artery which seems to arise from the right CCA rather than the subclavian, and remains diminutive to the vertebrobasilar junction. Fetal type right PCA origin. Review of the MIP images confirms the above findings IMPRESSION: 1. Negative for large vessel occlusion. 2. Positive for moderate to severe Right ICA supraclinoid segment stenosis due to calcified plaque. Left ICA siphon and bilateral carotid bifurcation atherosclerosis without significant stenosis. 3. 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 The non dominant right vertebral artery is diminutive and appears to arise from the right CCA rather than the right subclavian. Salient findings communicated to Dr. Laurence Slate at 1:04 pmon 3/4/2020by text page via the [facility] messaging system. Electronically Signed   By: Odessa Fleming M.D.   On: 12/07/2018 13:06   Ct Angio Neck W Or Wo  Contrast  Result Date: 12/07/2018 CLINICAL DATA:  73 year old male code stroke. Left side weakness and facial droop last seen normal at 1030 hours. EXAM: CT ANGIOGRAPHY HEAD AND NECK TECHNIQUE: Multidetector CT imaging of the head and neck was performed using the standard protocol during bolus administration of intravenous contrast. Multiplanar CT image reconstructions and MIPs were obtained to evaluate the vascular anatomy. Carotid stenosis measurements (when applicable) are obtained utilizing NASCET criteria, using the distal internal carotid diameter as the denominator. CONTRAST:  75mL OMNIPAQUE IOHEXOL 350 MG/ML SOLN COMPARISON:  Head CT without contrast 1221 hours today. Brain MRI and intracranial MRA 08/18/2014. FINDINGS: CTA NECK Skeleton: Negative. Absent dentition. Upper chest: Mild atelectasis in the upper lungs. No superior mediastinal lymphadenopathy. Other neck: Negative. Aortic arch: Aberrant origin of the right subclavian artery. Mild aortic arch calcified plaque. Right carotid system: Minimal plaque in the right CCA proximal to the bifurcation. Soft and calcified plaque at the right bifurcation and ICA origin without stenosis. Negative cervical right  ICA. Left carotid system: Left CCA origin mildly obscured by dense left subclavian vein contrast. Mildly tortuous proximal left CCA. Mild calcified plaque proximal to the bifurcation without stenosis. Bulky calcified plaque at the left ICA origin resulting in 50 % stenosis with respect to the distal vessel. Negative cervical left ICA otherwise. Vertebral arteries: Aberrant origin of the right subclavian artery with calcified plaque but no stenosis. The right vertebral artery is non dominant and seems to arise from the right CCA rather than the right subclavian on series 7, image 278. It has a late entry into the cervical transverse foramen and remains diminutive but patent to the skull base. No proximal left subclavian artery stenosis. Minimal calcified  plaque at the. Tortuous left V1 left vertebral artery origin without stenosis segment. The left vertebral is dominant and patent to the skull base without stenosis. CTA HEAD Posterior circulation: Diminutive non dominant right vertebral artery remains patent to the vertebrobasilar junction. The left vertebral artery is dominant and supplies the basilar with a normal left PICA origin. Patent basilar artery without stenosis. Normal SCA and left PCA origins. Fetal type right PCA origin. The left posterior communicating artery is also present. Mild bilateral PCA tortuosity. Visible PCA branches appear within normal limits. Anterior circulation: Patent ICA siphons. The left siphon is calcified through the cavernous and supraclinoid segments with mild stenosis. The right siphon demonstrates similar extensive calcification, but with moderate to severe supraclinoid segment stenosis as seen on series 7, image 99 and series 9, image 84. Normal posterior communicating artery origins. Patent carotid termini. Normal MCA and ACA origins. Diminutive or absent anterior communicating artery. Bilateral ACA branches are within normal limits. The left MCA M1 segment is tortuous. The left MCA bifurcation and left MCA branches are within normal limits. The right MCA M1 segment and bifurcation are patent without stenosis. Right MCA branches are within normal limits. Venous sinuses: Early contrast timing limits evaluation. Anatomic variants: Aberrant right subclavian artery origin. Dominant left vertebral artery. Non dominant and diminutive right vertebral artery which seems to arise from the right CCA rather than the subclavian, and remains diminutive to the vertebrobasilar junction. Fetal type right PCA origin. Review of the MIP images confirms the above findings IMPRESSION: 1. Negative for large vessel occlusion. 2. Positive for moderate to severe Right ICA supraclinoid segment stenosis due to calcified plaque. Left ICA siphon and  bilateral carotid bifurcation atherosclerosis without significant stenosis. 3. 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 The non dominant right vertebral artery is diminutive and appears to arise from the right CCA rather than the right subclavian. Salient findings communicated to Dr. Laurence Slate at 1:04 pmon 3/4/2020by text page via the [facility] messaging system. Electronically Signed   By: Odessa Fleming M.D.   On: 12/07/2018 13:06   Mr Brain Wo Contrast  Result Date: 12/07/2018 CLINICAL DATA:  73 y/o  M; left-sided weakness and facial droop. EXAM: MRI HEAD WITHOUT CONTRAST TECHNIQUE: Multiplanar, multiecho pulse sequences of the brain and surrounding structures were obtained without intravenous contrast. COMPARISON:  12/07/2018 CT head and CTA head.  08/18/2014 MRI head. FINDINGS: Brain: Motion degradation of multiple pulse sequences. 11 mm focus of reduced diffusion within the right lateral thalamus compatible with acute/early subacute infarction. No associated hemorrhage or mass effect. Probable additional punctate focus of infarction within left frontal cortex (series 3, image 33). Multiple small chronic infarcts are present within the cerebellum, right frontal cortex, left parietal cortex, and bilateral  occipital lobes. Confluent nonspecific T2 FLAIR hyperintensities in subcortical and periventricular white matter are compatible with moderate to severe chronic microvascular ischemic changes. Moderate volume loss of the brain. Vascular: Normal flow voids. Skull and upper cervical spine: Normal marrow signal. Sinuses/Orbits: Negative. Other: Bilateral intra-ocular lens replacement. IMPRESSION: 1. 11 mm acute/early subacute infarction within the right lateral thalamus. No associated hemorrhage or mass effect. Probable additional punctate focus of infarction in left frontal cortex. 2. Moderate to severe chronic microvascular ischemic changes and moderate  volume loss of the brain well as multiple small chronic infarcts in the brain with progression from 2015. These results were called by telephone at the time of interpretation on 12/07/2018 at 3:20 pm to Dr. Milon Dikes , who verbally acknowledged these results. Electronically Signed   By: Mitzi Hansen M.D.   On: 12/07/2018 15:21   Ct Head Code Stroke Wo Contrast  Result Date: 12/07/2018 CLINICAL DATA:  Code stroke. Left-sided weakness and facial droop. Last seen normal 1030 hours EXAM: CT HEAD WITHOUT CONTRAST TECHNIQUE: Contiguous axial images were obtained from the base of the skull through the vertex without intravenous contrast. COMPARISON:  CT head 07/25/2018 FINDINGS: Brain: Mild to moderate atrophy. Chronic infarct right frontal lobe unchanged. Small chronic infarct left occipital lobe unchanged. Chronic microvascular ischemia in the white matter. Benign-appearing calcification right pons is unchanged. Negative for acute infarct, hemorrhage, or mass Vascular: Negative for hyperdense vessel Skull: Negative Sinuses/Orbits: Paranasal sinuses clear.  Negative orbit Other: None ASPECTS (Alberta Stroke Program Early CT Score) - Ganglionic level infarction (caudate, lentiform nuclei, internal capsule, insula, M1-M3 cortex): 7 - Supraganglionic infarction (M4-M6 cortex): 3 Total score (0-10 with 10 being normal): 10 IMPRESSION: 1. No acute intracranial abnormality. 2. ASPECTS is 10 Electronically Signed   By: Marlan Palau M.D.   On: 12/07/2018 12:29    echocardiogram    2D Echo  1. The left ventricle has normal systolic function with an ejection fraction of 60-65%. The cavity size was normal. There is moderate asymmetric left ventricular hypertrophy. Left ventricular diastolic Doppler parameters are consistent with impaired  relaxation Elevated left atrial and left ventricular end-diastolic pressures. 2. The right ventricle has normal systolic function. The cavity was normal. There is no  increase in right ventricular wall thickness. 3. Left atrial size was mildly dilated. 4. The mitral valve is normal in structure. 5. The tricuspid valve is normal in structure. 6. The aortic valve is tricuspid Moderate thickening of the aortic valve Moderate calcification of the aortic valve. Aortic valve regurgitation is mild to moderate by color flow Doppler. 7. The pulmonic valve was normal in structure. 8. Cannot rule out PFO on apical 4 chamber view. Recommend limited study with agitated saline contrast injection.     Filed Weights   12/07/18 1205  Weight: 108.5 kg     Microbiology: No results found for this or any previous visit (from the past 240 hour(s)).     Blood Culture No results found for: SDES, SPECREQUEST, CULT, REPTSTATUS    Labs: Results for orders placed or performed during the hospital encounter of 12/07/18 (from the past 48 hour(s))  Ethanol     Status: None   Collection Time: 12/07/18 12:14 PM  Result Value Ref Range   Alcohol, Ethyl (B) <10 <10 mg/dL    Comment: (NOTE) Lowest detectable limit for serum alcohol is 10 mg/dL. For medical purposes only. Performed at [facility], [address] [place], [place] [address]   Protime-INR  Status: None   Collection Time: 12/07/18 12:15 PM  Result Value Ref Range   Prothrombin Time 14.3 11.4 - 15.2 seconds   INR 1.1 0.8 - 1.2    Comment: (NOTE) INR goal varies based on device and disease states. Performed at [facility], [address], [place], [place] [address]   APTT     Status: Abnormal   Collection Time: 12/07/18 12:15 PM  Result Value Ref Range   aPTT 49 (H) 24 - 36 seconds    Comment:        IF BASELINE aPTT IS ELEVATED, SUGGEST PATIENT RISK ASSESSMENT BE USED TO DETERMINE APPROPRIATE ANTICOAGULANT THERAPY. Performed at [facility], [address] [place], [place] [address]   CBC     Status: Abnormal   Collection Time: 12/07/18 12:15 PM  Result Value Ref  Range   WBC 3.6 (L) 4.0 - 10.5 K/uL   RBC 3.95 (L) 4.22 - 5.81 MIL/uL   Hemoglobin 12.3 (L) 13.0 - 17.0 g/dL   HCT 09.8 (L) 11.9 - 14.7 %   MCV 96.5 80.0 - 100.0 fL   MCH 31.1 26.0 - 34.0 pg   MCHC 32.3 30.0 - 36.0 g/dL   RDW 82.9 (L) 56.2 - 13.0 %   Platelets 129 (L) 150 - 400 K/uL   nRBC 0.0 0.0 - 0.2 %    Comment: Performed at [facility], [address] [place], [place] [address]  Differential     Status: None   Collection Time: 12/07/18 12:15 PM  Result Value Ref Range   Neutrophils Relative % 47 %   Neutro Abs 1.7 1.7 - 7.7 K/uL   Lymphocytes Relative 39 %   Lymphs Abs 1.4 0.7 - 4.0 K/uL   Monocytes Relative 12 %   Monocytes Absolute 0.4 0.1 - 1.0 K/uL   Eosinophils Relative 1 %   Eosinophils Absolute 0.0 0.0 - 0.5 K/uL   Basophils Relative 1 %   Basophils Absolute 0.0 0.0 - 0.1 K/uL   Immature Granulocytes 0 %   Abs Immature Granulocytes 0.00 0.00 - 0.07 K/uL    Comment: Performed at [facility], [address], [place], [place] [address]  Comprehensive metabolic panel     Status: Abnormal   Collection Time: 12/07/18 12:15 PM  Result Value Ref Range   Sodium 137 135 - 145 mmol/L   Potassium 4.5 3.5 - 5.1 mmol/L   Chloride 105 98 - 111 mmol/L   CO2 24 22 - 32 mmol/L   Glucose, Bld 173 (H) 70 - 99 mg/dL   BUN 21 8 - 23 mg/dL   Creatinine, Ser 9.52 (H) 0.61 - 1.24 mg/dL   Calcium 9.2 8.9 - 84.1 mg/dL   Total Protein 8.1 6.5 - 8.1 g/dL   Albumin 4.2 3.5 - 5.0 g/dL   AST 23 15 - 41 U/L   ALT 19 0 - 44 U/L   Alkaline Phosphatase 55 38 - 126 U/L   Total Bilirubin 0.9 0.3 - 1.2 mg/dL   GFR calc non Af Amer 44 (L) >60 mL/min   GFR calc Af Amer 51 (L) >60 mL/min   Anion gap 8 5 - 15    Comment: Performed at [facility], [address] [place], [place] [address]  I-stat Creatinine, ED     Status: Abnormal   Collection Time: 12/07/18 12:18 PM  Result Value Ref Range   Creatinine, Ser 1.60 (H) 0.61 - 1.24 mg/dL  CBG monitoring, ED     Status:  Abnormal   Collection Time: 12/07/18 12:51 PM  Result Value Ref Range   Glucose-Capillary 144 (H) 70 - 99 mg/dL  Urine rapid drug screen (hosp performed)     Status: None   Collection Time: 12/07/18  1:35 PM  Result Value Ref Range   Opiates NONE DETECTED NONE DETECTED   Cocaine NONE DETECTED NONE DETECTED   Benzodiazepines NONE DETECTED NONE DETECTED   Amphetamines NONE DETECTED NONE DETECTED   Tetrahydrocannabinol NONE DETECTED NONE DETECTED   Barbiturates NONE DETECTED NONE DETECTED    Comment: (NOTE) DRUG SCREEN FOR MEDICAL PURPOSES ONLY.  IF CONFIRMATION IS NEEDED FOR ANY PURPOSE, NOTIFY LAB WITHIN 5 DAYS. LOWEST DETECTABLE LIMITS FOR URINE DRUG SCREEN Drug Class                     Cutoff (ng/mL) Amphetamine and metabolites    1000 Barbiturate and metabolites    200 Benzodiazepine                 200 Tricyclics and metabolites     300 Opiates and metabolites        300 Cocaine and metabolites        300 THC                            50 Performed at [facility], [address], [place], [place] [address]   Urinalysis, Routine w reflex microscopic     Status: Abnormal   Collection Time: 12/07/18  1:35 PM  Result Value Ref Range   Color, Urine STRAW (A) YELLOW   APPearance CLEAR CLEAR   Specific Gravity, Urine 1.020 1.005 - 1.030   pH 7.0 5.0 - 8.0   Glucose, UA NEGATIVE NEGATIVE mg/dL   Hgb urine dipstick NEGATIVE NEGATIVE   Bilirubin Urine NEGATIVE NEGATIVE   Ketones, ur NEGATIVE NEGATIVE mg/dL   Protein, ur NEGATIVE NEGATIVE mg/dL   Nitrite NEGATIVE NEGATIVE   Leukocytes,Ua NEGATIVE NEGATIVE    Comment: Performed at [facility], [address], [place], [place] [address]  Basic metabolic panel     Status: Abnormal   Collection Time: 12/07/18 11:27 PM  Result Value Ref Range   Sodium 138 135 - 145 mmol/L   Potassium 4.1 3.5 - 5.1 mmol/L   Chloride 106 98 - 111 mmol/L   CO2 25 22 - 32 mmol/L   Glucose, Bld 190 (H) 70 - 99 mg/dL   BUN 20 8 - 23  mg/dL   Creatinine, Ser 3.24 (H) 0.61 - 1.24 mg/dL   Calcium 9.0 8.9 - 40.1 mg/dL   GFR calc non Af Amer 46 (L) >60 mL/min   GFR calc Af Amer 53 (L) >60 mL/min   Anion gap 7 5 - 15    Comment: Performed at [facility], [address] [place], [place] [address]  Magnesium     Status: Abnormal   Collection Time: 12/07/18 11:27 PM  Result Value Ref Range   Magnesium 1.6 (L) 1.7 - 2.4 mg/dL    Comment: Performed at [facility], [address], [place], [place] [address]  Hemoglobin A1c     Status: Abnormal   Collection Time: 12/08/18  5:04 AM  Result Value Ref Range   Hgb A1c MFr Bld 8.5 (H) 4.8 - 5.6 %    Comment: (NOTE) Pre diabetes:  5.7%-6.4% Diabetes:              >6.4% Glycemic control for   <7.0% adults with diabetes    Mean Plasma Glucose 197.25 mg/dL    Comment: Performed at [facility], [address], [place], [place] [address]  Lipid panel     Status: Abnormal   Collection Time: 12/08/18  5:04 AM  Result Value Ref Range   Cholesterol 119 0 - 200 mg/dL   Triglycerides 54 <604 mg/dL   HDL 34 (L) >54 mg/dL   Total CHOL/HDL Ratio 3.5 RATIO   VLDL 11 0 - 40 mg/dL   LDL Cholesterol 74 0 - 99 mg/dL    Comment:        Total Cholesterol/HDL:CHD Risk Coronary Heart Disease Risk Table                     Men   Women  1/2 Average Risk   3.4   3.3  Average Risk       5.0   4.4  2 X Average Risk   9.6   7.1  3 X Average Risk  23.4   11.0        Use the calculated Patient Ratio above and the CHD Risk Table to determine the patient's CHD Risk.        ATP III CLASSIFICATION (LDL):  <100     mg/dL   Optimal  098-119  mg/dL   Near or Above                    Optimal  130-159  mg/dL   Borderline  147-829  mg/dL   High  >562     mg/dL   Very High Performed at [facility], [address] [place], [place] [address]      Lipid Panel     Component Value Date/Time   CHOL 119 12/08/2018 0504   TRIG 54 12/08/2018 0504   HDL 34 (L)  12/08/2018 0504   CHOLHDL 3.5 12/08/2018 0504   VLDL 11 12/08/2018 0504   LDLCALC 74 12/08/2018 0504     Lab Results  Component Value Date   HGBA1C 8.5 (H) 12/08/2018     Lab Results  Component Value Date   LDLCALC 74 12/08/2018   CREATININE 1.51 (H) 12/07/2018     HPI   David Irwin is a 73 y.o. male with medical history significant of hypertension, diabetes not on treatment, lupus, early dementia with dementia related seizure disorder who is presenting to the emergency room from primary care physician office where he was found to have possible stroke.  Patient is poor historian.  Patient is accompanied by patient's wife and their daughter.  They noted that he did not sleep well last night and he woke up in the morning with slurring of his speech as well as he was stumbling around when getting ready to go to the doctor's office.  They went to doctor's office, they explained the symptoms, patient was also noted to have possible left facial droop so he was sent to the ER.  Patient himself currently denies any complaints to me.  No recent fever chills.  No seizure-like episodes.  Patient has some pain on his left shoulder, denies any weakness.  He is speaking in full sentences now.  Denies any visual changes. ED Course: Hemodynamically stable.  Subtle left facial droop noted but no other neurological deficit.  Code stroke was  called.  Patient with minimum or no neurological deficit, he was not a TPA candidate.  CT head and CT angiogram of the head and neck with no acute findings.  It does show chronic intracranial stenosis.  An MRI of the brain showed acute right thalamic stroke.  Seen by neurology in the emergency room.  HOSPITAL COURSE:    Acute ischemic right MCA stroke (HCC) -MRI noted acute right thalamic infarct -CTA head and neck noted severe right ICA stenosis -2D echocardiogram   ejection fraction of 60-65%. Cannot rule out PFO on apical 4 chamber view. Recommend limited study  with agitated saline contrast injection , however discussed with Dr Pearlean Brownie and repeat echo with agitated saline  is not required -LDL is 74 , on simvastatin -Hemoglobin A1c is 8.5 -He is on aspirin 81 mg at baseline   aspirin 81 mg daily prior to admission, now on aspirin 81 mg daily and clopidogrel 75 mg daily.  Continue Aspirin and Plavix for 3 weeks, then stop Aspirin and continue with plavix only.  -PT OT/SLP  Recommend SNF but patient chooses to go with home health  History of dementia and seizure disorder -Continue Keppra per home regimen  Hypertension -Continue to hold antihypertensives for permissive hypertension  Dyslipidemia -Continue statin  Obesity, Body mass index is 34.32 kg/m., recommend weight loss  Discharge Exam: *  Blood pressure (!) 148/78, pulse 73, temperature 98.9 F (37.2 C), temperature source Oral, resp. rate 20, height 5\' 10"  (1.778 m), weight 108.5 kg, SpO2 100 %. Cardiovascular system: S1 & S2 heard, RRR Gastrointestinal system: Abdomen is nondistended, soft and nontender.Normal bowel sounds heard. Central nervous system: Alert and oriented to self and place, mild dysarthria noted, no other focal abnormalities elicited Extremities: Symmetric 5 x 5 power. Skin: No rashes, lesions or ulcers     Follow-up Information    Daisy Floro, MD. Call.   Specialty:  Family Medicine Why:  Hospital follow-up in 3-5 days Contact information: [address] [place] [address] [phone]        Micki Riley, MD. Call.   Specialties:  Neurology, Radiology Why:  To make appointment with neurology, for stroke follow-up Contact information: [address] [place] [address] [phone]           Signed: Richarda Overlie 12/09/2018, 8:58 AM      Time needed to  prepare  discharge, discussed with the patient and family 35 minutes

## 2018-12-09 NOTE — Progress Notes (Signed)
Patient discharge home today with homehealth services.  IV and telemetry discontinued.  Rolling walker delivered to patient to take home AVS discussed.  Patient has no complaint of discomfort upon discharge.

## 2018-12-09 NOTE — Plan of Care (Signed)
  Problem: Education: Goal: Knowledge of secondary prevention will improve Outcome: Completed/Met Goal: Knowledge of patient specific risk factors addressed and post discharge goals established will improve Outcome: Completed/Met Goal: Individualized Educational Video(s) Outcome: Completed/Met Goal: Knowledge of secondary prevention will improve Outcome: Completed/Met Goal: Knowledge of patient specific risk factors addressed and post discharge goals established will improve Outcome: Completed/Met Goal: Individualized Educational Video(s) Outcome: Completed/Met   Problem: Ischemic Stroke/TIA Tissue Perfusion: Goal: Complications of ischemic stroke/TIA will be minimized Outcome: Completed/Met   Problem: Education: Goal: Knowledge of General Education information will improve Description Including pain rating scale, medication(s)/side effects and non-pharmacologic comfort measures Outcome: Completed/Met   Problem: Health Behavior/Discharge Planning: Goal: Ability to manage health-related needs will improve Outcome: Completed/Met   Problem: Clinical Measurements: Goal: Ability to maintain clinical measurements within normal limits will improve Outcome: Completed/Met Goal: Will remain free from infection Outcome: Completed/Met Goal: Diagnostic test results will improve Outcome: Completed/Met Goal: Respiratory complications will improve Outcome: Completed/Met Goal: Cardiovascular complication will be avoided Outcome: Completed/Met   Problem: Activity: Goal: Risk for activity intolerance will decrease Outcome: Completed/Met   Problem: Nutrition: Goal: Adequate nutrition will be maintained Outcome: Completed/Met   Problem: Coping: Goal: Level of anxiety will decrease Outcome: Completed/Met   Problem: Elimination: Goal: Will not experience complications related to bowel motility Outcome: Completed/Met Goal: Will not experience complications related to urinary  retention Outcome: Completed/Met   Problem: Pain Managment: Goal: General experience of comfort will improve Outcome: Completed/Met   Problem: Safety: Goal: Ability to remain free from injury will improve Outcome: Completed/Met   Problem: Skin Integrity: Goal: Risk for impaired skin integrity will decrease Outcome: Completed/Met

## 2018-12-10 DIAGNOSIS — F329 Major depressive disorder, single episode, unspecified: Secondary | ICD-10-CM | POA: Diagnosis not present

## 2018-12-10 DIAGNOSIS — I69392 Facial weakness following cerebral infarction: Secondary | ICD-10-CM | POA: Diagnosis not present

## 2018-12-10 DIAGNOSIS — I69354 Hemiplegia and hemiparesis following cerebral infarction affecting left non-dominant side: Secondary | ICD-10-CM | POA: Diagnosis not present

## 2018-12-10 DIAGNOSIS — Z7982 Long term (current) use of aspirin: Secondary | ICD-10-CM | POA: Diagnosis not present

## 2018-12-10 DIAGNOSIS — Z7902 Long term (current) use of antithrombotics/antiplatelets: Secondary | ICD-10-CM | POA: Diagnosis not present

## 2018-12-10 DIAGNOSIS — F0390 Unspecified dementia without behavioral disturbance: Secondary | ICD-10-CM | POA: Diagnosis not present

## 2018-12-10 DIAGNOSIS — Z9181 History of falling: Secondary | ICD-10-CM | POA: Diagnosis not present

## 2018-12-10 DIAGNOSIS — R32 Unspecified urinary incontinence: Secondary | ICD-10-CM | POA: Diagnosis not present

## 2018-12-10 DIAGNOSIS — I10 Essential (primary) hypertension: Secondary | ICD-10-CM | POA: Diagnosis not present

## 2018-12-10 DIAGNOSIS — E119 Type 2 diabetes mellitus without complications: Secondary | ICD-10-CM | POA: Diagnosis not present

## 2018-12-10 DIAGNOSIS — E785 Hyperlipidemia, unspecified: Secondary | ICD-10-CM | POA: Diagnosis not present

## 2018-12-15 DIAGNOSIS — R899 Unspecified abnormal finding in specimens from other organs, systems and tissues: Secondary | ICD-10-CM | POA: Diagnosis not present

## 2018-12-15 DIAGNOSIS — Z09 Encounter for follow-up examination after completed treatment for conditions other than malignant neoplasm: Secondary | ICD-10-CM | POA: Diagnosis not present

## 2018-12-15 DIAGNOSIS — I63511 Cerebral infarction due to unspecified occlusion or stenosis of right middle cerebral artery: Secondary | ICD-10-CM | POA: Diagnosis not present

## 2018-12-15 DIAGNOSIS — I69354 Hemiplegia and hemiparesis following cerebral infarction affecting left non-dominant side: Secondary | ICD-10-CM | POA: Diagnosis not present

## 2018-12-26 ENCOUNTER — Telehealth: Payer: Self-pay | Admitting: *Deleted

## 2018-12-26 NOTE — Patient Outreach (Signed)
[facility]) Care Management  12/26/2018  David Irwin [birth year] [id number]   CSW received referral from [facility] RN, David Irwin that patient was recently hospitalized for stroke, mild cognitive impairment, moderate dementia & that wife states she is concerned about husband's angry outbursts and him stating that he cannot do anything right. Wife states that there are 2 wobbly steps coming into the home and needs assistance with repairs. CSW called & spoke with patient's wife, David Irwin (cellphone #: [phone]) who informed CSW that patient's attitude has been better in the past 2 weeks that she has learned to go behind him in the mornings to make sure that he actually takes his medications which help control his outbursts and that his outlook has been a little better given the circumstances. David Irwin shared that prior to stroke, patient had been very active and is looking forward to being able to go to the senior [facility] and participating in activities and although they have been quarantining themselves due to the coronovirus, they have been able to spend some time outside and enjoy the nice weather. Patient has been seen by physical & occupational therapy through [facility], still waiting for speech therapy though. Patient's wife had been visited by the social worker with David Irwin who informed wife that they also have a contact that could help assist with fixing the 2 wobbly steps. David Irwin states that the 2 wobby steps that she had expressed concern about are in the front of their house and are a little steep, but that they prefer to use the back steps which are easier for them to use, but that she was just concerned about the front steps for visitors. CSW spoke with patient's wife about [employer] as a Theatre stage manager and offered to have their brochure mailed to them, but patient's wife declined and took down the phone number and plans to call [employer] to inquire about the  program.  Patient's wife declined the need for behavioral health intervention at this time and hopes that his outbursts were just an adjustment period. CSW plans to check back in 2 weeks to ensure contact had been made to [employer] and that no further CSW needs had come up. CSW encouraged wife to call CSW if any needs came up before 2 weeks.    David Maxin, LCSW [facility]  Clinical Social Worker cell #: [phone]

## 2019-01-09 ENCOUNTER — Other Ambulatory Visit: Payer: Self-pay | Admitting: *Deleted

## 2019-01-09 DIAGNOSIS — M79675 Pain in left toe(s): Secondary | ICD-10-CM | POA: Diagnosis not present

## 2019-01-09 DIAGNOSIS — M79674 Pain in right toe(s): Secondary | ICD-10-CM | POA: Diagnosis not present

## 2019-01-09 DIAGNOSIS — M5430 Sciatica, unspecified side: Secondary | ICD-10-CM | POA: Diagnosis not present

## 2019-01-09 DIAGNOSIS — H1013 Acute atopic conjunctivitis, bilateral: Secondary | ICD-10-CM | POA: Diagnosis not present

## 2019-01-09 DIAGNOSIS — E1169 Type 2 diabetes mellitus with other specified complication: Secondary | ICD-10-CM | POA: Diagnosis not present

## 2019-01-09 DIAGNOSIS — B351 Tinea unguium: Secondary | ICD-10-CM | POA: Diagnosis not present

## 2019-01-09 DIAGNOSIS — H02889 Meibomian gland dysfunction of unspecified eye, unspecified eyelid: Secondary | ICD-10-CM | POA: Diagnosis not present

## 2019-01-10 DIAGNOSIS — E119 Type 2 diabetes mellitus without complications: Secondary | ICD-10-CM | POA: Diagnosis not present

## 2019-01-10 DIAGNOSIS — F0390 Unspecified dementia without behavioral disturbance: Secondary | ICD-10-CM | POA: Diagnosis not present

## 2019-01-10 DIAGNOSIS — I69392 Facial weakness following cerebral infarction: Secondary | ICD-10-CM | POA: Diagnosis not present

## 2019-01-10 DIAGNOSIS — Z7982 Long term (current) use of aspirin: Secondary | ICD-10-CM | POA: Diagnosis not present

## 2019-01-10 DIAGNOSIS — Z7902 Long term (current) use of antithrombotics/antiplatelets: Secondary | ICD-10-CM | POA: Diagnosis not present

## 2019-01-10 DIAGNOSIS — R32 Unspecified urinary incontinence: Secondary | ICD-10-CM | POA: Diagnosis not present

## 2019-01-10 DIAGNOSIS — F329 Major depressive disorder, single episode, unspecified: Secondary | ICD-10-CM | POA: Diagnosis not present

## 2019-01-10 DIAGNOSIS — I10 Essential (primary) hypertension: Secondary | ICD-10-CM | POA: Diagnosis not present

## 2019-01-10 DIAGNOSIS — E785 Hyperlipidemia, unspecified: Secondary | ICD-10-CM | POA: Diagnosis not present

## 2019-01-10 DIAGNOSIS — I69354 Hemiplegia and hemiparesis following cerebral infarction affecting left non-dominant side: Secondary | ICD-10-CM | POA: Diagnosis not present

## 2019-01-10 DIAGNOSIS — Z9181 History of falling: Secondary | ICD-10-CM | POA: Diagnosis not present

## 2019-01-12 NOTE — Patient Outreach (Signed)
[facility]) Care Management  01/12/2019  David Irwin 27-Mar-1946 [id number]   CSW called to follow-up with patient's wife regarding her concerns of his angry outbursts. Patient's wife reports that "it's gotten quite a bit better & it's not as frequent at all" he's doing a lot better, he had some pain in his hip but he had a doctors appointment on Tuesday - they're working on getting his blood sugar down. [facility] had discharged him from their services - his last session with PT was this past Saturday and OT was stopped before then. Patient is not having to use any assistive devices, just a cane when they go outside. Patient's wife states that she has been very ontop of his medications and making sure he takes them all when he is prescribed. CSW offered patient's wife information on Caregiver Support groups, but she declined at this time.   CSW will perform a case closure on patient, as further social work needs have been identified at this time.   [name], LCSW [facility]  Clinical Social Worker cell #: [phone]

## 2019-01-17 DIAGNOSIS — I69392 Facial weakness following cerebral infarction: Secondary | ICD-10-CM | POA: Diagnosis not present

## 2019-01-17 DIAGNOSIS — I69354 Hemiplegia and hemiparesis following cerebral infarction affecting left non-dominant side: Secondary | ICD-10-CM | POA: Diagnosis not present

## 2019-01-17 DIAGNOSIS — F329 Major depressive disorder, single episode, unspecified: Secondary | ICD-10-CM | POA: Diagnosis not present

## 2019-01-17 DIAGNOSIS — F0390 Unspecified dementia without behavioral disturbance: Secondary | ICD-10-CM | POA: Diagnosis not present

## 2019-01-17 DIAGNOSIS — E785 Hyperlipidemia, unspecified: Secondary | ICD-10-CM | POA: Diagnosis not present

## 2019-01-17 DIAGNOSIS — E119 Type 2 diabetes mellitus without complications: Secondary | ICD-10-CM | POA: Diagnosis not present

## 2019-01-17 DIAGNOSIS — R32 Unspecified urinary incontinence: Secondary | ICD-10-CM | POA: Diagnosis not present

## 2019-01-17 DIAGNOSIS — Z9181 History of falling: Secondary | ICD-10-CM | POA: Diagnosis not present

## 2019-01-17 DIAGNOSIS — Z7902 Long term (current) use of antithrombotics/antiplatelets: Secondary | ICD-10-CM | POA: Diagnosis not present

## 2019-01-17 DIAGNOSIS — I10 Essential (primary) hypertension: Secondary | ICD-10-CM | POA: Diagnosis not present

## 2019-01-17 DIAGNOSIS — Z7982 Long term (current) use of aspirin: Secondary | ICD-10-CM | POA: Diagnosis not present

## 2019-01-19 ENCOUNTER — Encounter: Payer: Self-pay | Admitting: *Deleted

## 2019-01-19 ENCOUNTER — Telehealth: Payer: Self-pay | Admitting: *Deleted

## 2019-01-19 NOTE — Telephone Encounter (Signed)
Spoke with wife, [name] on [place] and informed her due to current COVID 19 pandemic, our office is severely reducing in person visits in order to minimize the risk to our patients and healthcare providers. We recommend to convert patient's appointment to a video visit. We'll take all precautions to reduce any security or privacy concerns. This will be treated like an office visit, and we will file with insurance. She consented to video visit. Pt's email is [email or web address]. [name] understands that the [employer] must be downloaded and operational on the device pt plans to use for the visit. Updated EMR. Answered questions, she verbalized understanding, appreciation. Webex scheduled; e mail sent.

## 2019-01-23 ENCOUNTER — Ambulatory Visit (INDEPENDENT_AMBULATORY_CARE_PROVIDER_SITE_OTHER): Payer: PPO | Admitting: Diagnostic Neuroimaging

## 2019-01-23 ENCOUNTER — Other Ambulatory Visit: Payer: Self-pay

## 2019-01-23 ENCOUNTER — Encounter: Payer: Self-pay | Admitting: Diagnostic Neuroimaging

## 2019-01-23 DIAGNOSIS — I6381 Other cerebral infarction due to occlusion or stenosis of small artery: Secondary | ICD-10-CM

## 2019-01-23 DIAGNOSIS — M150 Primary generalized (osteo)arthritis: Secondary | ICD-10-CM | POA: Diagnosis not present

## 2019-01-23 DIAGNOSIS — F03B0 Unspecified dementia, moderate, without behavioral disturbance, psychotic disturbance, mood disturbance, and anxiety: Secondary | ICD-10-CM

## 2019-01-23 DIAGNOSIS — R569 Unspecified convulsions: Secondary | ICD-10-CM | POA: Diagnosis not present

## 2019-01-23 DIAGNOSIS — R430 Anosmia: Secondary | ICD-10-CM | POA: Diagnosis not present

## 2019-01-23 DIAGNOSIS — F0390 Unspecified dementia without behavioral disturbance: Secondary | ICD-10-CM

## 2019-01-23 DIAGNOSIS — M5136 Other intervertebral disc degeneration, lumbar region: Secondary | ICD-10-CM | POA: Diagnosis not present

## 2019-01-23 DIAGNOSIS — I639 Cerebral infarction, unspecified: Secondary | ICD-10-CM | POA: Diagnosis not present

## 2019-01-23 DIAGNOSIS — M329 Systemic lupus erythematosus, unspecified: Secondary | ICD-10-CM | POA: Diagnosis not present

## 2019-01-23 DIAGNOSIS — R413 Other amnesia: Secondary | ICD-10-CM | POA: Diagnosis not present

## 2019-01-23 MED ORDER — LEVETIRACETAM 500 MG PO TABS: 500.0000 mg | ORAL_TABLET | Freq: Two times a day (BID) | ORAL | 12 refills | Status: DC

## 2019-01-23 NOTE — Progress Notes (Signed)
Virtual Visit via Video Note  I connected with David Irwin on 01/23/19 at 11:00 AM EDT by a video enabled telemedicine application and verified that I am speaking with the correct person using two identifiers. I discussed evaluation with patient and his wife.    I discussed the limitations of evaluation and management by telemedicine and the availability of in person appointments. The patient expressed understanding and agreed to proceed.  History of Present Illness:  - here for eval of new right thalamic stroke (March 2020); patient admitted to the hospital in March 2020 for left-sided weakness. - walking has improved; speech improving - tolerating meds - no seizures - memory loss stable    Observations/Objective:  - awake, alert - decr speech; decr memory - left hand slow; right hand orbits over left - left foot tapping slower than right - able to stand and walk   12/07/18 CTA head / neck 1. Negative for large vessel occlusion. 2. Positive for moderate to severe Right ICA supraclinoid segment stenosis due to calcified plaque. Left ICA siphon and bilateral carotid bifurcation atherosclerosis without significant stenosis. 3. 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 The non dominant right vertebral artery is diminutive and appears to arise from the right CCA rather than the right subclavian.  12/07/18 MRI brain  1. 11 mm acute/early subacute infarction within the right lateral thalamus. No associated hemorrhage or mass effect. Probable additional punctate focus of infarction in left frontal cortex. 2. Moderate to severe chronic microvascular ischemic changes and moderate volume loss of the brain well as multiple small chronic infarcts in the brain with progression from 2015.  12/08/18 TTE 1. The left ventricle has normal systolic function with an ejection fraction of 60-65%. The cavity size was normal. There  is moderate asymmetric left ventricular hypertrophy. Left ventricular diastolic Doppler parameters are consistent with impaired  relaxation Elevated left atrial and left ventricular end-diastolic pressures.  2. The right ventricle has normal systolic function. The cavity was normal. There is no increase in right ventricular wall thickness.  3. Left atrial size was mildly dilated.  4. The mitral valve is normal in structure.  5. The tricuspid valve is normal in structure.  6. The aortic valve is tricuspid Moderate thickening of the aortic valve Moderate calcification of the aortic valve. Aortic valve regurgitation is mild to moderate by color flow Doppler.  7. The pulmonic valve was normal in structure.  8. Cannot rule out PFO on apical 4 chamber view. Recommend limited study with agitated saline contrast injection.    Assessment and Plan:  RIGHT THALAMIC STROKE (small vessel dz; symptomatic) + left frontal punctate infarct (small vessel dz; asymptomatic) - continue plavix 75mg , BP, DM control meds  SEIZURE (due to dementia)  - continue levetiracetam 500mg  twice a day   MEMORY LOSS (dementia + depression) - discussed memantine; will consider to start in future memantine 10mg  at bedtime; increase to twice a day after 1-2 weeks - safety / supervision issues reviewed - caregiver resources provided - no driving; caution with finances - continue aspirin, statin, diabetes control - brain healthy activities reviewed and encouraged (nutrition, physical, mental, social activities)  DEPRESSION - follow up with PCP / psychiatry   Follow Up Instructions:  - Return in about 6 months (around 07/25/2019).    I discussed the assessment and treatment plan with the patient. The patient was provided an opportunity to ask questions and all  were answered. The patient agreed with the plan and demonstrated an understanding of the instructions.   The patient was advised to call back or seek an  in-person evaluation if the symptoms worsen or if the condition fails to improve as anticipated.  I provided 25 minutes of non-face-to-face time during this encounter.   Suanne MarkerVIKRAM R. Layaan Mott, MD 01/23/2019, 11:29 AM Certified in Neurology, Neurophysiology and Neuroimaging  [facility] [address], [address] [place], [place] [id number] [phone]

## 2019-01-26 DIAGNOSIS — I69392 Facial weakness following cerebral infarction: Secondary | ICD-10-CM | POA: Diagnosis not present

## 2019-01-26 DIAGNOSIS — Z7902 Long term (current) use of antithrombotics/antiplatelets: Secondary | ICD-10-CM | POA: Diagnosis not present

## 2019-01-26 DIAGNOSIS — I10 Essential (primary) hypertension: Secondary | ICD-10-CM | POA: Diagnosis not present

## 2019-01-26 DIAGNOSIS — Z9181 History of falling: Secondary | ICD-10-CM | POA: Diagnosis not present

## 2019-01-26 DIAGNOSIS — F329 Major depressive disorder, single episode, unspecified: Secondary | ICD-10-CM | POA: Diagnosis not present

## 2019-01-26 DIAGNOSIS — I69354 Hemiplegia and hemiparesis following cerebral infarction affecting left non-dominant side: Secondary | ICD-10-CM | POA: Diagnosis not present

## 2019-01-26 DIAGNOSIS — E119 Type 2 diabetes mellitus without complications: Secondary | ICD-10-CM | POA: Diagnosis not present

## 2019-01-26 DIAGNOSIS — E785 Hyperlipidemia, unspecified: Secondary | ICD-10-CM | POA: Diagnosis not present

## 2019-01-26 DIAGNOSIS — R32 Unspecified urinary incontinence: Secondary | ICD-10-CM | POA: Diagnosis not present

## 2019-01-26 DIAGNOSIS — Z7982 Long term (current) use of aspirin: Secondary | ICD-10-CM | POA: Diagnosis not present

## 2019-01-26 DIAGNOSIS — F0390 Unspecified dementia without behavioral disturbance: Secondary | ICD-10-CM | POA: Diagnosis not present

## 2019-01-31 ENCOUNTER — Ambulatory Visit: Payer: PPO | Admitting: Diagnostic Neuroimaging

## 2019-02-02 DIAGNOSIS — E785 Hyperlipidemia, unspecified: Secondary | ICD-10-CM | POA: Diagnosis not present

## 2019-02-02 DIAGNOSIS — I69354 Hemiplegia and hemiparesis following cerebral infarction affecting left non-dominant side: Secondary | ICD-10-CM | POA: Diagnosis not present

## 2019-02-02 DIAGNOSIS — F329 Major depressive disorder, single episode, unspecified: Secondary | ICD-10-CM | POA: Diagnosis not present

## 2019-02-02 DIAGNOSIS — E119 Type 2 diabetes mellitus without complications: Secondary | ICD-10-CM | POA: Diagnosis not present

## 2019-02-02 DIAGNOSIS — R32 Unspecified urinary incontinence: Secondary | ICD-10-CM | POA: Diagnosis not present

## 2019-02-02 DIAGNOSIS — Z7982 Long term (current) use of aspirin: Secondary | ICD-10-CM | POA: Diagnosis not present

## 2019-02-02 DIAGNOSIS — Z9181 History of falling: Secondary | ICD-10-CM | POA: Diagnosis not present

## 2019-02-02 DIAGNOSIS — Z7902 Long term (current) use of antithrombotics/antiplatelets: Secondary | ICD-10-CM | POA: Diagnosis not present

## 2019-02-02 DIAGNOSIS — I10 Essential (primary) hypertension: Secondary | ICD-10-CM | POA: Diagnosis not present

## 2019-02-02 DIAGNOSIS — F0390 Unspecified dementia without behavioral disturbance: Secondary | ICD-10-CM | POA: Diagnosis not present

## 2019-02-02 DIAGNOSIS — I69392 Facial weakness following cerebral infarction: Secondary | ICD-10-CM | POA: Diagnosis not present

## 2019-02-16 DIAGNOSIS — Z1211 Encounter for screening for malignant neoplasm of colon: Secondary | ICD-10-CM | POA: Diagnosis not present

## 2019-02-16 DIAGNOSIS — Z86010 Personal history of colonic polyps: Secondary | ICD-10-CM | POA: Diagnosis not present

## 2019-02-16 DIAGNOSIS — K219 Gastro-esophageal reflux disease without esophagitis: Secondary | ICD-10-CM | POA: Diagnosis not present

## 2019-03-28 ENCOUNTER — Other Ambulatory Visit: Payer: Self-pay | Admitting: *Deleted

## 2019-03-28 DIAGNOSIS — Z20822 Contact with and (suspected) exposure to covid-19: Secondary | ICD-10-CM

## 2019-03-28 NOTE — Progress Notes (Signed)
lab

## 2019-04-02 LAB — NOVEL CORONAVIRUS, NAA: SARS-CoV-2, NAA: NOT DETECTED

## 2019-04-04 DIAGNOSIS — G4733 Obstructive sleep apnea (adult) (pediatric): Secondary | ICD-10-CM | POA: Diagnosis not present

## 2019-05-23 ENCOUNTER — Telehealth: Payer: Self-pay | Admitting: Diagnostic Neuroimaging

## 2019-05-23 DIAGNOSIS — G629 Polyneuropathy, unspecified: Secondary | ICD-10-CM | POA: Diagnosis not present

## 2019-05-23 DIAGNOSIS — R944 Abnormal results of kidney function studies: Secondary | ICD-10-CM | POA: Diagnosis not present

## 2019-05-23 DIAGNOSIS — D649 Anemia, unspecified: Secondary | ICD-10-CM | POA: Diagnosis not present

## 2019-05-23 MED ORDER — LEVETIRACETAM 500 MG PO TABS: 500.0000 mg | ORAL_TABLET | Freq: Two times a day (BID) | ORAL | 2 refills | Status: DC

## 2019-05-23 NOTE — Telephone Encounter (Signed)
Pt's wife called stating they have switched pharmacies to the [facility] and they are needing a new prescription for the pt's levETIRAcetam (KEPPRA) 500 MG tablet Please advise.

## 2019-05-23 NOTE — Telephone Encounter (Signed)
Called wife, [name] to be sure I added correct pharmacy. [facility] is now [facility], [place].  I advised will send a new Rx to [facility]; he needs 90 day supply for mail order. We scheduled his 6 month FU with NP at wife's agreement. She  verbalized understanding, appreciation.

## 2019-05-26 DIAGNOSIS — G629 Polyneuropathy, unspecified: Secondary | ICD-10-CM | POA: Diagnosis not present

## 2019-05-26 DIAGNOSIS — Z79899 Other long term (current) drug therapy: Secondary | ICD-10-CM | POA: Diagnosis not present

## 2019-05-26 DIAGNOSIS — D649 Anemia, unspecified: Secondary | ICD-10-CM | POA: Diagnosis not present

## 2019-05-26 DIAGNOSIS — R944 Abnormal results of kidney function studies: Secondary | ICD-10-CM | POA: Diagnosis not present

## 2019-06-06 DIAGNOSIS — G4733 Obstructive sleep apnea (adult) (pediatric): Secondary | ICD-10-CM | POA: Diagnosis not present

## 2019-06-23 DIAGNOSIS — L602 Onychogryphosis: Secondary | ICD-10-CM | POA: Diagnosis not present

## 2019-06-23 DIAGNOSIS — M2042 Other hammer toe(s) (acquired), left foot: Secondary | ICD-10-CM | POA: Diagnosis not present

## 2019-06-23 DIAGNOSIS — E1351 Other specified diabetes mellitus with diabetic peripheral angiopathy without gangrene: Secondary | ICD-10-CM | POA: Diagnosis not present

## 2019-06-23 DIAGNOSIS — M2041 Other hammer toe(s) (acquired), right foot: Secondary | ICD-10-CM | POA: Diagnosis not present

## 2019-06-29 DIAGNOSIS — D649 Anemia, unspecified: Secondary | ICD-10-CM | POA: Diagnosis not present

## 2019-06-29 DIAGNOSIS — I10 Essential (primary) hypertension: Secondary | ICD-10-CM | POA: Diagnosis not present

## 2019-06-29 DIAGNOSIS — I639 Cerebral infarction, unspecified: Secondary | ICD-10-CM | POA: Diagnosis not present

## 2019-06-29 DIAGNOSIS — E1169 Type 2 diabetes mellitus with other specified complication: Secondary | ICD-10-CM | POA: Diagnosis not present

## 2019-06-29 DIAGNOSIS — F329 Major depressive disorder, single episode, unspecified: Secondary | ICD-10-CM | POA: Diagnosis not present

## 2019-06-29 DIAGNOSIS — N183 Chronic kidney disease, stage 3 (moderate): Secondary | ICD-10-CM | POA: Diagnosis not present

## 2019-07-03 ENCOUNTER — Ambulatory Visit (INDEPENDENT_AMBULATORY_CARE_PROVIDER_SITE_OTHER): Payer: PPO | Admitting: Family Medicine

## 2019-07-03 ENCOUNTER — Other Ambulatory Visit: Payer: Self-pay

## 2019-07-03 ENCOUNTER — Encounter: Payer: Self-pay | Admitting: Family Medicine

## 2019-07-03 VITALS — BP 132/68 | HR 60 | Temp 98.0°F | Ht 70.0 in | Wt 223.0 lb

## 2019-07-03 DIAGNOSIS — G40909 Epilepsy, unspecified, not intractable, without status epilepticus: Secondary | ICD-10-CM

## 2019-07-03 DIAGNOSIS — F0390 Unspecified dementia without behavioral disturbance: Secondary | ICD-10-CM | POA: Diagnosis not present

## 2019-07-03 DIAGNOSIS — I63511 Cerebral infarction due to unspecified occlusion or stenosis of right middle cerebral artery: Secondary | ICD-10-CM | POA: Diagnosis not present

## 2019-07-03 DIAGNOSIS — F03B0 Unspecified dementia, moderate, without behavioral disturbance, psychotic disturbance, mood disturbance, and anxiety: Secondary | ICD-10-CM

## 2019-07-03 MED ORDER — MEMANTINE HCL 28 X 5 MG & 21 X 10 MG PO TABS: ORAL_TABLET | ORAL | 12 refills | Status: DC

## 2019-07-03 NOTE — Progress Notes (Signed)
PATIENT: David Irwin DOB: 10/29/1945  REASON FOR VISIT: follow up HISTORY FROM: patient  Chief Complaint  Patient presents with  . Follow-up    6 mon f/u. Wife present. New room. Patient's wife stated that he has been sleeping alot.      HISTORY OF PRESENT ILLNESS: Today 07/03/19 David Irwin is a 73 y.o. male here today for follow up of seizure and memory loss.  He continues levetiracetam 500 mg twice daily.  No seizure activity noted.  Occasionally he will have a short episode of staring but uncertain if this is seizure.  This has occurred approximately 3 times since last visit.  No tonic-clonic movements.  No incontinence or tongue injury.  His wife reports that memory continues to decline.  He was last seen by Dr. Leta Baptist and advised to consider Namenda.  He does not drive.  He is able to perform ADLs at home independently.  His wife reports that he is more sleepy.  He is eating well.  No falls.  He does walk with a walker.  He continues close follow-up with primary care.  No strokelike symptoms since last being seen in April.  He continues simvastatin 20 mg as well as Plavix daily.  HISTORY: (copied from Dr Gladstone Lighter note on 01/23/2019)  - here for eval of new right thalamic stroke (March 2020); patient admitted to the hospital in March 2020 for left-sided weakness. - walking has improved; speech improving - tolerating meds - no seizures - memory loss stable   REVIEW OF SYSTEMS: Out of a complete 14 system review of symptoms, the patient complains only of the following symptoms, memory loss, seizure and all other reviewed systems are negative.  ALLERGIES: Allergies  Allergen Reactions  . Liraglutide Other (See Comments)    hallucinations   . Shellfish Allergy Hives    HOME MEDICATIONS: Outpatient Medications Prior to Visit  Medication Sig Dispense Refill  . acetaminophen (TYLENOL) 500 MG tablet Take 500 mg by mouth every 6 (six) hours as needed for headache  (pain).    . clobetasol cream (TEMOVATE) 1.54 % Apply 1 application topically as needed (break out lupus reaction on scalp).    . clopidogrel (PLAVIX) 75 MG tablet Take 1 tablet (75 mg total) by mouth daily. 30 tablet 11  . fluticasone (FLONASE) 50 MCG/ACT nasal spray Place 2 sprays into both nostrils at bedtime as needed for allergies or rhinitis.   5  . glimepiride (AMARYL) 2 MG tablet Take 2 mg by mouth daily with breakfast.    . hydroxychloroquine (PLAQUENIL) 200 MG tablet Take 200 mg by mouth 2 (two) times daily.     Marland Kitchen levETIRAcetam (KEPPRA) 500 MG tablet Take 1 tablet (500 mg total) by mouth 2 (two) times daily. 180 tablet 2  . Multiple Vitamins-Minerals (ALIVE MENS ENERGY PO) Take 1 tablet by mouth daily. Gummie    . omeprazole (PRILOSEC) 20 MG capsule Take 1 capsule (20 mg total) by mouth daily. 30 capsule 1  . polyvinyl alcohol (ARTIFICIAL TEARS) 1.4 % ophthalmic solution Place 1 drop into both eyes daily as needed for dry eyes.    . potassium chloride SA (K-DUR,KLOR-CON) 20 MEQ tablet Take 20 mEq by mouth daily.     Marland Kitchen PRESCRIPTION MEDICATION Inhale into the lungs at bedtime. CPAP    . simvastatin (ZOCOR) 20 MG tablet Take 20 mg by mouth at bedtime.     . tamsulosin (FLOMAX) 0.4 MG CAPS Take 0.4 mg by mouth daily.     Marland Kitchen  telmisartan-hydrochlorothiazide (MICARDIS HCT) 40-12.5 MG tablet Take 2 tablets by mouth daily.  4  . vitamin B-12 (CYANOCOBALAMIN) 1000 MCG tablet Take 1,000 mcg by mouth daily.     No facility-administered medications prior to visit.     PAST MEDICAL HISTORY: Past Medical History:  Diagnosis Date  . CVA (cerebral vascular accident) (HCC) 12/07/2018  . Diabetes mellitus without complication (HCC)   . Hypertension   . Lupus (HCC)   . Memory loss   . Murmur, cardiac   . Nocturia   . Seizure (HCC)     PAST SURGICAL HISTORY: Past Surgical History:  Procedure Laterality Date  . APPENDECTOMY    . EYE SURGERY      FAMILY HISTORY: Family History  Problem  Relation Age of Onset  . Kidney disease Mother   . Dementia Father     SOCIAL HISTORY: Social History   Socioeconomic History  . Marital status: Married    Spouse name: [name]  . Number of children: 2  . Years of education: 12th  . Highest education level: Not on file  Occupational History  . Occupation: Retired  Engineer, production  . Financial resource strain: Not on file  . Food insecurity    Worry: Not on file    Inability: Not on file  . Transportation needs    Medical: Not on file    Non-medical: Not on file  Tobacco Use  . Smoking status: Never Smoker  . Smokeless tobacco: Never Used  Substance and Sexual Activity  . Alcohol use: No  . Drug use: No  . Sexual activity: Not on file  Lifestyle  . Physical activity    Days per week: Not on file    Minutes per session: Not on file  . Stress: Not on file  Relationships  . Social Musician on phone: Not on file    Gets together: Not on file    Attends religious service: Not on file    Active member of club or organization: Not on file    Attends meetings of clubs or organizations: Not on file    Relationship status: Not on file  . Intimate partner violence    Fear of current or ex partner: Not on file    Emotionally abused: Not on file    Physically abused: Not on file    Forced sexual activity: Not on file  Other Topics Concern  . Not on file  Social History Narrative   Patient lives at home with spouse.   Caffeine Use: 1-2 cups daily   12th grade      PHYSICAL EXAM  Vitals:   07/03/19 1024  BP: 132/68  Pulse: 60  Temp: 98 F (36.7 C)  TempSrc: Oral  Weight: 223 lb (101.2 kg)  Height: 5\' 10"  (1.778 m)   Body mass index is 32 kg/m.  Generalized: Well developed, in no acute distress  Cardiology: normal rate and rhythm, no murmur noted Neurological examination  Mentation: Alert, not oriented to time or place with exception of city, oriented to some history taking. Follows all commands  speech and language fluent Cranial nerve II-XII: Pupils were equal round reactive to light. Extraocular movements were full, visual field were full on confrontational test. Facial sensation and strength were normal. Uvula tongue midline. Head turning and shoulder shrug  were normal and symmetric. Motor: The motor testing reveals 4 over 5 strength of all 4 extremities. Good symmetric motor tone is noted throughout.  Sensory:  Sensory testing is intact to soft touch on all 4 extremities. No evidence of extinction is noted.  Coordination: Cerebellar testing reveals slow finger-nose-finger and heel-to-shin bilaterally.  Gait and station: Gait is narrow. Tandem gait not attempted   Reflexes: Deep tendon reflexes are symmetric and normal bilaterally.   DIAGNOSTIC DATA (LABS, IMAGING, TESTING) - I reviewed patient records, labs, notes, testing and imaging myself where available.  MMSE - Mini Mental State Exam 07/03/2019 07/27/2018 12/16/2017  Not completed: (No Data) - -  Orientation to time 0 2 4  Orientation to Place 2 4 4   Registration 3 3 3   Attention/ Calculation 1 1 1   Recall 1 0 2  Language- name 2 objects 2 2 2   Language- repeat 1 1 0  Language- follow 3 step command 2 3 3   Language- follow 3 step command-comments he grabbed the paper with his left hand. - -  Language- read & follow direction 1 1 1   Write a sentence 1 0 1  Write a sentence-comments - no subject -  Copy design 0 0 0  Total score 14 17 21      Lab Results  Component Value Date   WBC 3.6 (L) 12/07/2018   HGB 12.3 (L) 12/07/2018   HCT 38.1 (L) 12/07/2018   MCV 96.5 12/07/2018   PLT 129 (L) 12/07/2018      Component Value Date/Time   NA 138 12/07/2018 2327   K 4.1 12/07/2018 2327   CL 106 12/07/2018 2327   CO2 25 12/07/2018 2327   GLUCOSE 190 (H) 12/07/2018 2327   BUN 20 12/07/2018 2327   CREATININE 1.51 (H) 12/07/2018 2327   CALCIUM 9.0 12/07/2018 2327   PROT 8.1 12/07/2018 1215   ALBUMIN 4.2 12/07/2018 1215    AST 23 12/07/2018 1215   ALT 19 12/07/2018 1215   ALKPHOS 55 12/07/2018 1215   BILITOT 0.9 12/07/2018 1215   GFRNONAA 46 (L) 12/07/2018 2327   GFRAA 53 (L) 12/07/2018 2327   Lab Results  Component Value Date   CHOL 119 12/08/2018   HDL 34 (L) 12/08/2018   LDLCALC 74 12/08/2018   TRIG 54 12/08/2018   CHOLHDL 3.5 12/08/2018   Lab Results  Component Value Date   HGBA1C 8.5 (H) 12/08/2018   No results found for: VITAMINB12 No results found for: TSH    ASSESSMENT AND PLAN 73 y.o. year old male  has a past medical history of CVA (cerebral vascular accident) (HCC) (12/07/2018), Diabetes mellitus without complication (HCC), Hypertension, Lupus (HCC), Memory loss, Murmur, cardiac, Nocturia, and Seizure (HCC). here with     ICD-10-CM   1. Moderate dementia without behavioral disturbance (HCC)  F03.90   2. Acute ischemic right MCA stroke (HCC)  I63.511   3. Seizure disorder (HCC)  G40.909     [name] is doing well on levetiracetam.  We will continue 500 mg twice daily.  I have advised his wife to monitor events of staring closely and call if they seem to be worsening or occurring more frequently.  Memory does seem to be declining.  MMSE today is 14.  We will add Namenda.  Titration pack called into pharmacy.  His wife was educated on appropriate administration.  Side effects discussed.  He will continue to be as active as possible.  Memory compensation strategies discussed.  Dementia education given.  He will follow-up closely with primary care.  He will continue Plavix and statin as prescribed.  He will follow-up with us in 6 months, sooner if  needed.   No orders of the defined types were placed in this encounter.    Meds ordered this encounter  Medications  . memantine (NAMENDA TITRATION PAK) tablet pack    Sig: 5 mg/day for =1 week; 5 mg twice daily for =1 week; 15 mg/day given in 5 mg and 10 mg separated doses for =1 week; then 10 mg twice daily    Dispense:  49 tablet    Refill:   12    Order Specific Question:   Supervising Provider    Answer:   Anson Fret [[id number]]      I spent 15 minutes with the patient. 50% of this time was spent counseling and educating patient on plan of care and medications.    Shawnie Dapper, FNP-C 07/03/2019, 4:29 PM [facility] [address] [place], [place] [address] [phone]

## 2019-07-03 NOTE — Patient Instructions (Signed)
We will start Namenda per titration instructions  Try to increase activity  Follow up in 6 months, sooner if needed  Dementia Caregiver Guide Dementia is a term used to describe a number of symptoms that affect memory and thinking. The most common symptoms include:  Memory loss.  Trouble with language and communication.  Trouble concentrating.  Poor judgment.  Problems with reasoning.  Child-like behavior and language.  Extreme anxiety.  Angry outbursts.  Wandering from home or public places. Dementia usually gets worse slowly over time. In the early stages, people with dementia can stay independent and safe with some help. In later stages, they need help with daily tasks such as dressing, grooming, and using the bathroom. How to help the person with dementia cope Dementia can be frightening and confusing. Here are some tips to help the person with dementia cope with changes caused by the disease. General tips  Keep the person on track with his or her routine.  Try to identify areas where the person may need help.  Be supportive, patient, calm, and encouraging.  Gently remind the person that adjusting to changes takes time.  Help with the tasks that the person has asked for help with.  Keep the person involved in daily tasks and decisions as much as possible.  Encourage conversation, but try not to get frustrated or harried if the person struggles to find words or does not seem to appreciate your help. Communication tips  When the person is talking or seems frustrated, make eye contact and hold the person's hand.  Ask specific questions that need yes or no answers.  Use simple words, short sentences, and a calm voice. Only give one direction at a time.  When offering choices, limit them to just 1 or 2.  Avoid correcting the person in a negative way.  If the person is struggling to find the right words, gently try to help him or her. How to recognize symptoms  of stress Symptoms of stress in caregivers include:  Feeling frustrated or angry with the person with dementia.  Denying that the person has dementia or that his or her symptoms will not improve.  Feeling hopeless and unappreciated.  Difficulty sleeping.  Difficulty concentrating.  Feeling anxious, irritable, or depressed.  Developing stress-related health problems.  Feeling like you have too little time for your own life. Follow these instructions at home:   Make sure that you and the person you are caring for: ? Get regular sleep. ? Exercise regularly. ? Eat regular, nutritious meals. ? Drink enough fluid to keep your urine clear or pale yellow. ? Take over-the-counter and prescription medicines only as told by your health care providers. ? Attend all scheduled health care appointments.  Join a support group with others who are caregivers.  Ask about respite care resources so that you can have a regular break from the stress of caregiving.  Look for signs of stress in yourself and in the person you are caring for. If you notice signs of stress, take steps to manage it.  Consider any safety risks and take steps to avoid them.  Organize medications in a pill box for each day of the week.  Create a plan to handle any legal or financial matters. Get legal or financial advice if needed.  Keep a calendar in a central location to remind the person of appointments or other activities. Tips for reducing the risk of injury  Keep floors clear of clutter. Remove rugs, magazine racks,  and floor lamps.  Keep hallways well lit, especially at night.  Put a handrail and nonslip mat in the bathtub or shower.  Put childproof locks on cabinets that contain dangerous items, such as medicines, alcohol, guns, toxic cleaning items, sharp tools or utensils, matches, and lighters.  Put the locks in places where the person cannot see or reach them easily. This will help ensure that the  person does not wander out of the house and get lost.  Be prepared for emergencies. Keep a list of emergency phone numbers and addresses in a convenient area.  Remove car keys and lock garage doors so that the person does not try to get in the car and drive.  Have the person wear a bracelet that tracks locations and identifies the person as having memory problems. This should be worn at all times for safety. Where to find support: Many individuals and organizations offer support. These include:  Support groups for people with dementia and for caregivers.  Counselors or therapists.  Home health care services.  Adult day care centers. Where to find more information Alzheimer's Association: Limit[employer]hu Contact a health care provider if:  The person's health is rapidly getting worse.  You are no longer able to care for the person.  Caring for the person is affecting your physical and emotional health.  The person threatens himself or herself, you, or anyone else. Summary  Dementia is a term used to describe a number of symptoms that affect memory and thinking.  Dementia usually gets worse slowly over time.  Take steps to reduce the person's risk of injury, and to plan for future care.  Caregivers need support, relief from caregiving, and time for their own lives. This information is not intended to replace advice given to you by your health care provider. Make sure you discuss any questions you have with your health care provider. Document Released: 08/25/2016 Document Revised: 09/03/2017 Document Reviewed: 08/25/2016 Elsevier Patient Education  2020 [employer].

## 2019-07-04 DIAGNOSIS — G4733 Obstructive sleep apnea (adult) (pediatric): Secondary | ICD-10-CM | POA: Diagnosis not present

## 2019-07-24 ENCOUNTER — Telehealth: Payer: Self-pay | Admitting: Neurology

## 2019-07-24 ENCOUNTER — Other Ambulatory Visit: Payer: Self-pay | Admitting: Family Medicine

## 2019-07-24 MED ORDER — MEMANTINE HCL 5 MG PO TABS: 5.0000 mg | ORAL_TABLET | Freq: Two times a day (BID) | ORAL | 11 refills | Status: DC

## 2019-07-24 NOTE — Telephone Encounter (Addendum)
Called wife and advised her of Amy's message. She would like to decrease to 5 mg two times a day, and she'll need new Rx for 5 mg tablets. I advised will let Amy know and for her to call us back with any questions or problems. She  verbalized understanding, appreciation.

## 2019-07-24 NOTE — Telephone Encounter (Addendum)
Spoke with wife, David Irwin on [place] who stated after 2nd week of namenda when he began taking 10 mg in am 5 mg in pm is when she noted he "is in a fog, emotional, couldn't get words out. "  He told wife, "I feel nonchalant."  He falls to sleep readily.  We discussed lowering dose back to 5 mg twice daily to see if side effects resolve, which she is agreeable to. However she stated if it doesn't seem to improve his issues she will discuss stopping medication. I advised will discuss with Amy and call her back. She  verbalized understanding, appreciation.

## 2019-07-24 NOTE — Telephone Encounter (Signed)
I just realized that I have missed a text message from the on-call service yesterday in the afternoon at 5:23 PM regarding: Patient's wife called indicating that medication is causing increased sleepiness.I reviewed his chart, when he saw Amy about 3 weeks ago Namenda was started that is the only new medication I can think of.  Tyler Aas, would you call his wife and find out more? Maybe Namenda needs to be reduced? Please d/w Amy and Dr. Leta Baptist. Sorry for the delay.

## 2019-07-24 NOTE — Telephone Encounter (Signed)
Done

## 2019-07-24 NOTE — Telephone Encounter (Signed)
I am ok with decreasing dose to see if this helps. Have him go back to 5mg  twice daily. If he is doing well, we will continue that dose. If symptoms persists he should follow up with [place] or PCP depending on symptoms.

## 2019-07-25 DIAGNOSIS — M150 Primary generalized (osteo)arthritis: Secondary | ICD-10-CM | POA: Diagnosis not present

## 2019-07-25 DIAGNOSIS — R430 Anosmia: Secondary | ICD-10-CM | POA: Diagnosis not present

## 2019-07-25 DIAGNOSIS — R413 Other amnesia: Secondary | ICD-10-CM | POA: Diagnosis not present

## 2019-07-25 DIAGNOSIS — Z6832 Body mass index (BMI) 32.0-32.9, adult: Secondary | ICD-10-CM | POA: Diagnosis not present

## 2019-07-25 DIAGNOSIS — E669 Obesity, unspecified: Secondary | ICD-10-CM | POA: Diagnosis not present

## 2019-07-25 DIAGNOSIS — M329 Systemic lupus erythematosus, unspecified: Secondary | ICD-10-CM | POA: Diagnosis not present

## 2019-07-25 DIAGNOSIS — M5136 Other intervertebral disc degeneration, lumbar region: Secondary | ICD-10-CM | POA: Diagnosis not present

## 2019-07-31 DIAGNOSIS — I10 Essential (primary) hypertension: Secondary | ICD-10-CM | POA: Diagnosis not present

## 2019-07-31 DIAGNOSIS — Z1159 Encounter for screening for other viral diseases: Secondary | ICD-10-CM | POA: Diagnosis not present

## 2019-07-31 DIAGNOSIS — E1169 Type 2 diabetes mellitus with other specified complication: Secondary | ICD-10-CM | POA: Diagnosis not present

## 2019-07-31 DIAGNOSIS — N183 Chronic kidney disease, stage 3 unspecified: Secondary | ICD-10-CM | POA: Diagnosis not present

## 2019-07-31 DIAGNOSIS — I69354 Hemiplegia and hemiparesis following cerebral infarction affecting left non-dominant side: Secondary | ICD-10-CM | POA: Diagnosis not present

## 2019-07-31 DIAGNOSIS — Z125 Encounter for screening for malignant neoplasm of prostate: Secondary | ICD-10-CM | POA: Diagnosis not present

## 2019-07-31 DIAGNOSIS — R350 Frequency of micturition: Secondary | ICD-10-CM | POA: Diagnosis not present

## 2019-07-31 DIAGNOSIS — E7800 Pure hypercholesterolemia, unspecified: Secondary | ICD-10-CM | POA: Diagnosis not present

## 2019-07-31 DIAGNOSIS — Z0000 Encounter for general adult medical examination without abnormal findings: Secondary | ICD-10-CM | POA: Diagnosis not present

## 2019-07-31 NOTE — Progress Notes (Signed)
I reviewed note and agree with plan.   Josede Cicero R. Andrew Blasius, MD 07/31/2019, 10:29 AM Certified in Neurology, Neurophysiology and Neuroimaging  [facility] [address], [address] [place], [place] [address] [phone]   

## 2019-08-07 DIAGNOSIS — N183 Chronic kidney disease, stage 3 unspecified: Secondary | ICD-10-CM | POA: Diagnosis not present

## 2019-08-07 DIAGNOSIS — Z79899 Other long term (current) drug therapy: Secondary | ICD-10-CM | POA: Diagnosis not present

## 2019-08-07 DIAGNOSIS — E7800 Pure hypercholesterolemia, unspecified: Secondary | ICD-10-CM | POA: Diagnosis not present

## 2019-08-07 DIAGNOSIS — Z0000 Encounter for general adult medical examination without abnormal findings: Secondary | ICD-10-CM | POA: Diagnosis not present

## 2019-08-07 DIAGNOSIS — Z125 Encounter for screening for malignant neoplasm of prostate: Secondary | ICD-10-CM | POA: Diagnosis not present

## 2019-08-07 DIAGNOSIS — E1169 Type 2 diabetes mellitus with other specified complication: Secondary | ICD-10-CM | POA: Diagnosis not present

## 2019-08-07 DIAGNOSIS — I10 Essential (primary) hypertension: Secondary | ICD-10-CM | POA: Diagnosis not present

## 2019-08-07 DIAGNOSIS — Z1159 Encounter for screening for other viral diseases: Secondary | ICD-10-CM | POA: Diagnosis not present

## 2019-08-16 DIAGNOSIS — F329 Major depressive disorder, single episode, unspecified: Secondary | ICD-10-CM | POA: Diagnosis not present

## 2019-08-16 DIAGNOSIS — E1169 Type 2 diabetes mellitus with other specified complication: Secondary | ICD-10-CM | POA: Diagnosis not present

## 2019-08-16 DIAGNOSIS — D649 Anemia, unspecified: Secondary | ICD-10-CM | POA: Diagnosis not present

## 2019-08-16 DIAGNOSIS — E7800 Pure hypercholesterolemia, unspecified: Secondary | ICD-10-CM | POA: Diagnosis not present

## 2019-08-16 DIAGNOSIS — I639 Cerebral infarction, unspecified: Secondary | ICD-10-CM | POA: Diagnosis not present

## 2019-08-16 DIAGNOSIS — I10 Essential (primary) hypertension: Secondary | ICD-10-CM | POA: Diagnosis not present

## 2019-08-29 DIAGNOSIS — G4733 Obstructive sleep apnea (adult) (pediatric): Secondary | ICD-10-CM | POA: Diagnosis not present

## 2019-08-29 IMAGING — CT CT HEAD W/O CM
3 of 4 series · 13 of 47 positions shown, 15 images · non-contrast
Comparison: MR brain 08/18/2014.

CLINICAL DATA: Staring spells, slurred speech, difficulty speaking.

EXAM:
CT HEAD WITHOUT CONTRAST
TECHNIQUE: Contiguous axial images were obtained from the base of the skull
through the vertex without intravenous contrast.

[Series 3: head wo · axial · 0.46mm/px · z∈[+1374,+1494]mm · 7 of 33 slices shown, 9 images]
[im 5/33  brain]
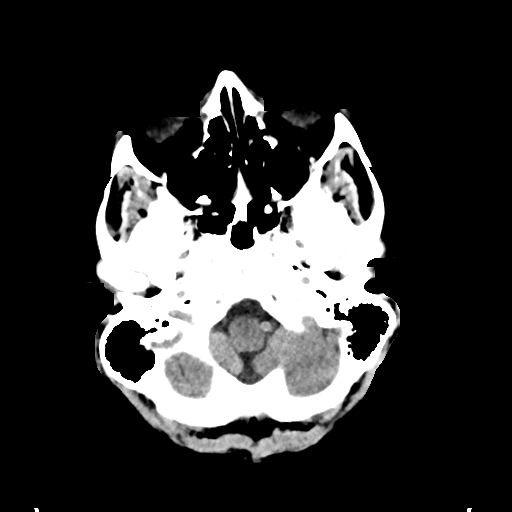
[im 5/33  bone]
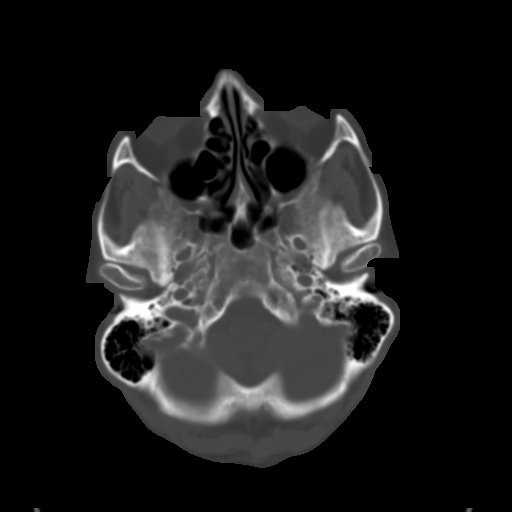
[im 9/33  brain]
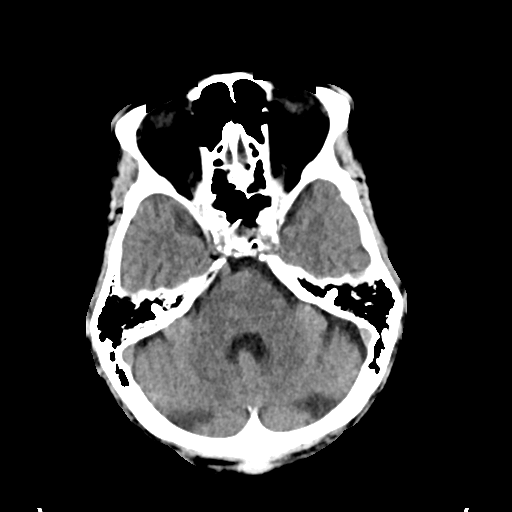
[im 13/33  brain]
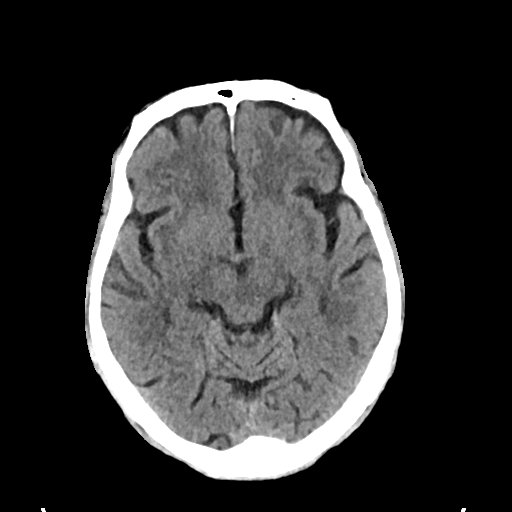
[im 17/33  brain]
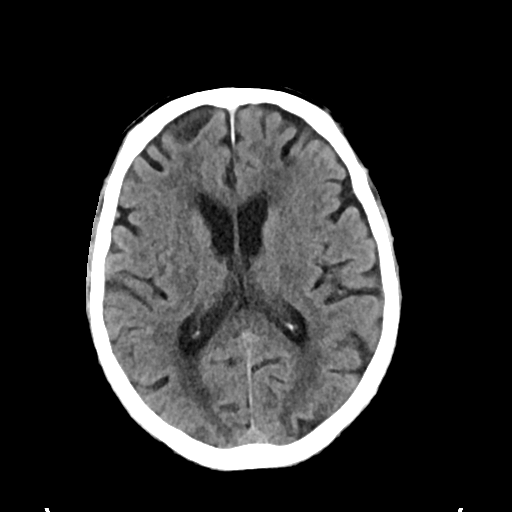
[im 21/33  brain]
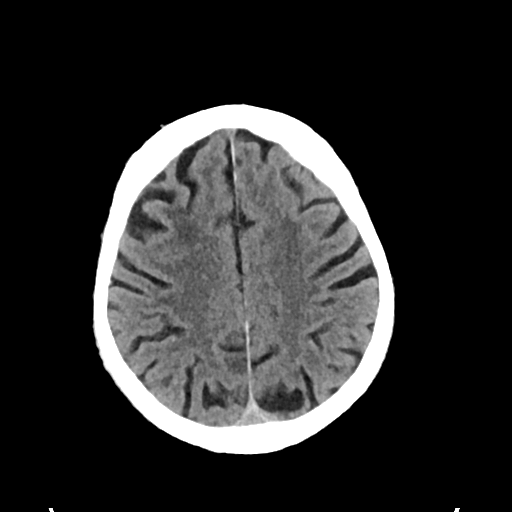
[im 21/33  bone]
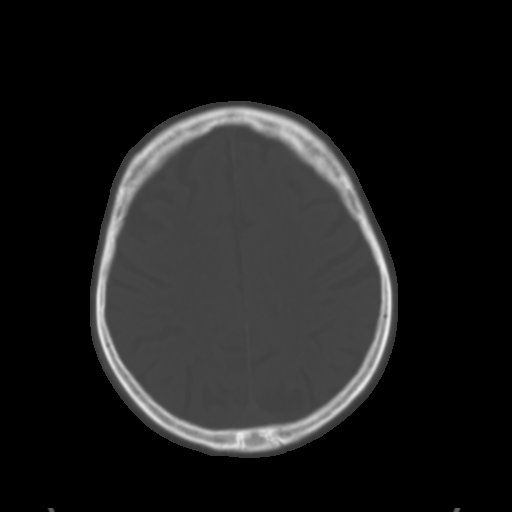
[im 25/33  brain]
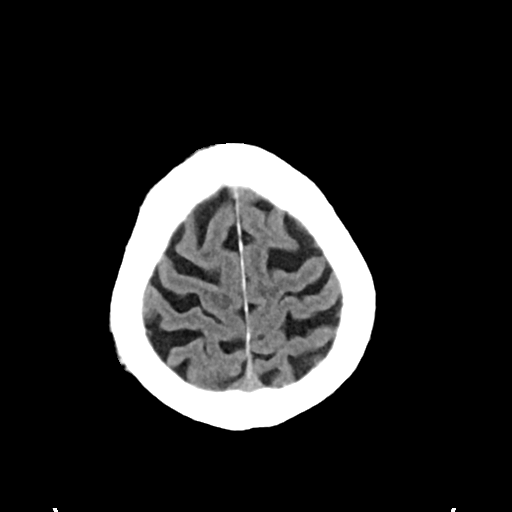
[im 29/33  brain]
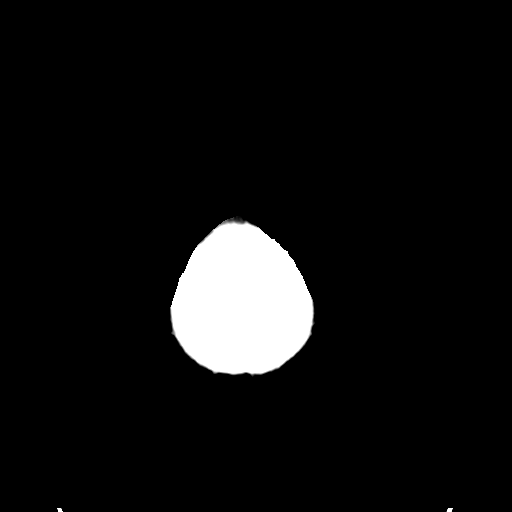

[Series 5: cor soft · coronal · 0.31mm/px · 3 of 74 slices shown]
[im 25/74  brain]
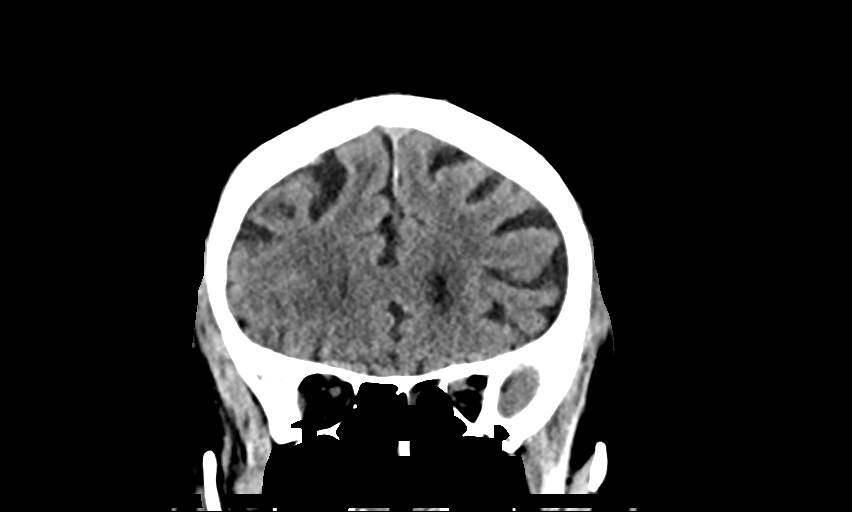
[im 33/74  brain]
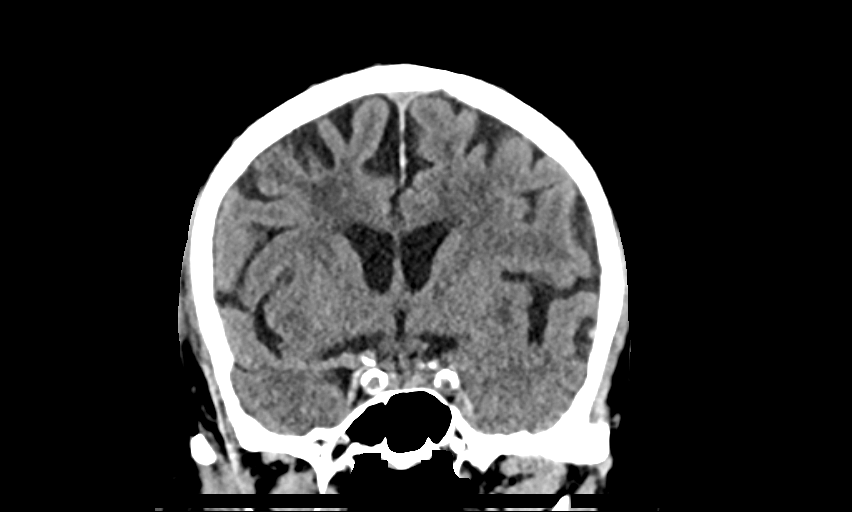
[im 41/74  brain]
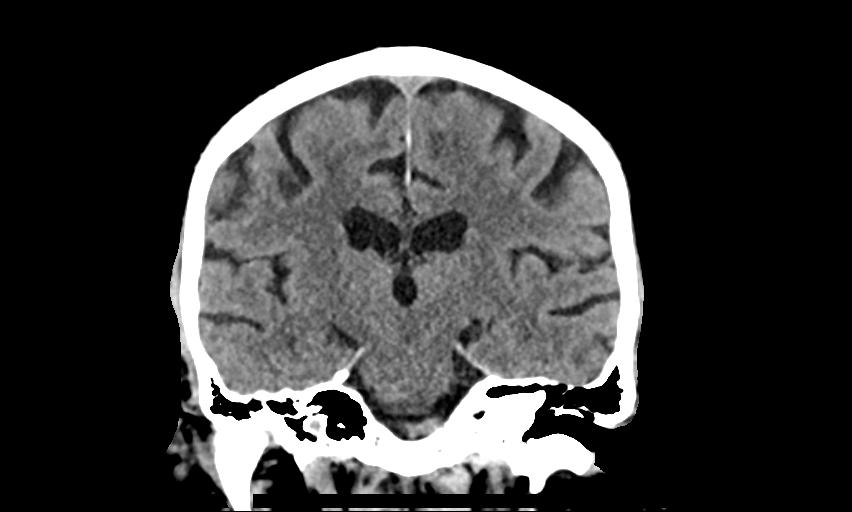

[Series 6: sag soft · sagittal · 0.31mm/px · 3 of 67 slices shown]
[im 23/67  brain]
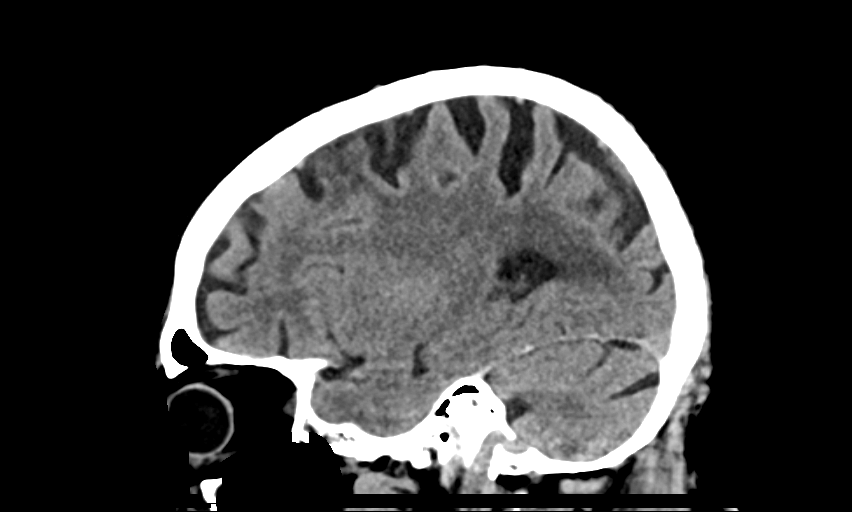
[im 34/67  brain]
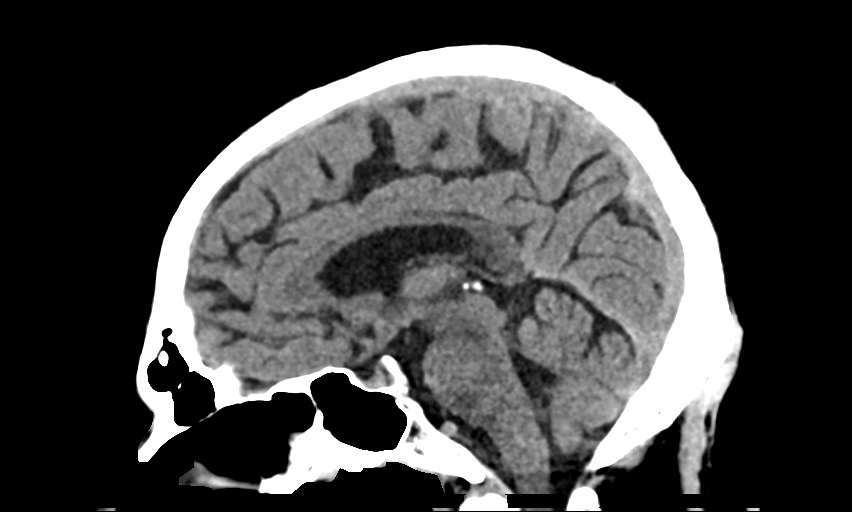
[im 45/67  brain]
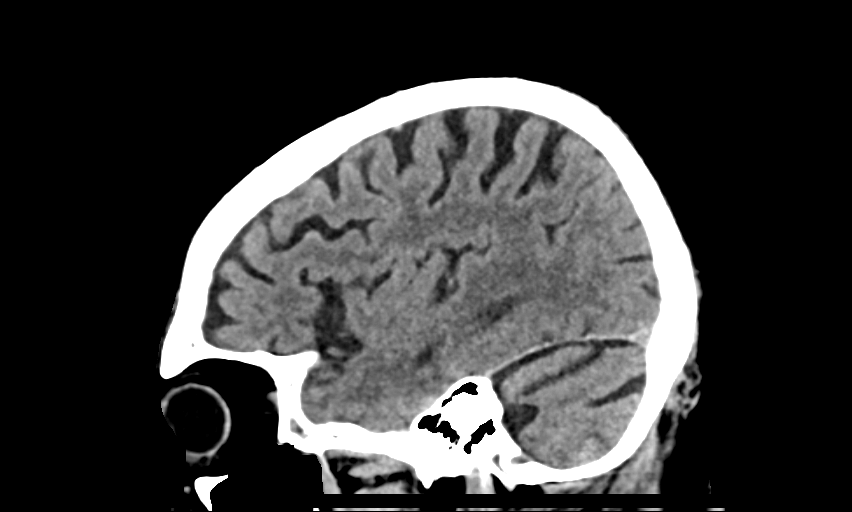

[13 of 47 positions shown; findings below may reference images not displayed]

FINDINGS: Brain: No evidence for acute infarction, hemorrhage, mass lesion,
hydrocephalus, or extra-axial fluid. Generalized atrophy. White
matter hypoattenuation, likely chronic microvascular ischemic
change. Dystrophic calcification, RIGHT brainstem.

Vascular: Calcification of the cavernous internal carotid arteries
consistent with cerebrovascular atherosclerotic disease. No signs of
intracranial large vessel occlusion.

Skull: Calvarium intact.

Sinuses/Orbits: No acute sinus disease. BILATERAL cataract
extraction.

Other: No mastoid or middle ear fluid.
IMPRESSION: Atrophy.  Small vessel disease.

No acute intracranial findings.

Cerebrovascular atherosclerosis.

## 2019-09-22 DIAGNOSIS — E7800 Pure hypercholesterolemia, unspecified: Secondary | ICD-10-CM | POA: Diagnosis not present

## 2019-09-22 DIAGNOSIS — F0390 Unspecified dementia without behavioral disturbance: Secondary | ICD-10-CM | POA: Diagnosis not present

## 2019-09-22 DIAGNOSIS — I639 Cerebral infarction, unspecified: Secondary | ICD-10-CM | POA: Diagnosis not present

## 2019-09-22 DIAGNOSIS — E1169 Type 2 diabetes mellitus with other specified complication: Secondary | ICD-10-CM | POA: Diagnosis not present

## 2019-09-22 DIAGNOSIS — I10 Essential (primary) hypertension: Secondary | ICD-10-CM | POA: Diagnosis not present

## 2019-09-22 DIAGNOSIS — D649 Anemia, unspecified: Secondary | ICD-10-CM | POA: Diagnosis not present

## 2019-09-22 DIAGNOSIS — F329 Major depressive disorder, single episode, unspecified: Secondary | ICD-10-CM | POA: Diagnosis not present

## 2019-09-28 DIAGNOSIS — G4733 Obstructive sleep apnea (adult) (pediatric): Secondary | ICD-10-CM | POA: Diagnosis not present

## 2019-10-09 ENCOUNTER — Telehealth: Payer: Self-pay | Admitting: Family Medicine

## 2019-10-09 MED ORDER — LEVETIRACETAM 500 MG PO TABS: 500.0000 mg | ORAL_TABLET | Freq: Two times a day (BID) | ORAL | 1 refills | Status: DC

## 2019-10-09 MED ORDER — MEMANTINE HCL 5 MG PO TABS: 5.0000 mg | ORAL_TABLET | Freq: Two times a day (BID) | ORAL | 6 refills | Status: DC

## 2019-10-09 NOTE — Telephone Encounter (Signed)
Kia from [facility] called stating that the pt's levETIRAcetam (KEPPRA) 500 MG tablet and his memantine (NAMENDA) 5 MG tablet are needing to be switched to the [facility] on [address] [phone]

## 2019-10-12 DIAGNOSIS — E7800 Pure hypercholesterolemia, unspecified: Secondary | ICD-10-CM | POA: Diagnosis not present

## 2019-10-12 DIAGNOSIS — I10 Essential (primary) hypertension: Secondary | ICD-10-CM | POA: Diagnosis not present

## 2019-10-12 DIAGNOSIS — I639 Cerebral infarction, unspecified: Secondary | ICD-10-CM | POA: Diagnosis not present

## 2019-10-12 DIAGNOSIS — D649 Anemia, unspecified: Secondary | ICD-10-CM | POA: Diagnosis not present

## 2019-10-12 DIAGNOSIS — F329 Major depressive disorder, single episode, unspecified: Secondary | ICD-10-CM | POA: Diagnosis not present

## 2019-10-12 DIAGNOSIS — F0390 Unspecified dementia without behavioral disturbance: Secondary | ICD-10-CM | POA: Diagnosis not present

## 2019-10-12 DIAGNOSIS — E1169 Type 2 diabetes mellitus with other specified complication: Secondary | ICD-10-CM | POA: Diagnosis not present

## 2019-10-29 DIAGNOSIS — G4733 Obstructive sleep apnea (adult) (pediatric): Secondary | ICD-10-CM | POA: Diagnosis not present

## 2019-12-01 DIAGNOSIS — D649 Anemia, unspecified: Secondary | ICD-10-CM | POA: Diagnosis not present

## 2019-12-01 DIAGNOSIS — E1169 Type 2 diabetes mellitus with other specified complication: Secondary | ICD-10-CM | POA: Diagnosis not present

## 2019-12-01 DIAGNOSIS — I639 Cerebral infarction, unspecified: Secondary | ICD-10-CM | POA: Diagnosis not present

## 2019-12-01 DIAGNOSIS — F329 Major depressive disorder, single episode, unspecified: Secondary | ICD-10-CM | POA: Diagnosis not present

## 2019-12-01 DIAGNOSIS — I10 Essential (primary) hypertension: Secondary | ICD-10-CM | POA: Diagnosis not present

## 2019-12-01 DIAGNOSIS — E7800 Pure hypercholesterolemia, unspecified: Secondary | ICD-10-CM | POA: Diagnosis not present

## 2019-12-01 DIAGNOSIS — F0390 Unspecified dementia without behavioral disturbance: Secondary | ICD-10-CM | POA: Diagnosis not present

## 2019-12-14 DIAGNOSIS — F329 Major depressive disorder, single episode, unspecified: Secondary | ICD-10-CM | POA: Diagnosis not present

## 2019-12-14 DIAGNOSIS — D649 Anemia, unspecified: Secondary | ICD-10-CM | POA: Diagnosis not present

## 2019-12-14 DIAGNOSIS — I639 Cerebral infarction, unspecified: Secondary | ICD-10-CM | POA: Diagnosis not present

## 2019-12-14 DIAGNOSIS — I10 Essential (primary) hypertension: Secondary | ICD-10-CM | POA: Diagnosis not present

## 2019-12-14 DIAGNOSIS — E1169 Type 2 diabetes mellitus with other specified complication: Secondary | ICD-10-CM | POA: Diagnosis not present

## 2019-12-14 DIAGNOSIS — F0390 Unspecified dementia without behavioral disturbance: Secondary | ICD-10-CM | POA: Diagnosis not present

## 2019-12-14 DIAGNOSIS — N183 Chronic kidney disease, stage 3 unspecified: Secondary | ICD-10-CM | POA: Diagnosis not present

## 2019-12-14 DIAGNOSIS — E7800 Pure hypercholesterolemia, unspecified: Secondary | ICD-10-CM | POA: Diagnosis not present

## 2020-01-01 ENCOUNTER — Ambulatory Visit: Payer: PPO | Admitting: Family Medicine

## 2020-01-08 DIAGNOSIS — R269 Unspecified abnormalities of gait and mobility: Secondary | ICD-10-CM | POA: Diagnosis not present

## 2020-01-08 DIAGNOSIS — R972 Elevated prostate specific antigen [PSA]: Secondary | ICD-10-CM | POA: Diagnosis not present

## 2020-01-08 DIAGNOSIS — I69354 Hemiplegia and hemiparesis following cerebral infarction affecting left non-dominant side: Secondary | ICD-10-CM | POA: Diagnosis not present

## 2020-01-08 DIAGNOSIS — H6123 Impacted cerumen, bilateral: Secondary | ICD-10-CM | POA: Diagnosis not present

## 2020-01-08 DIAGNOSIS — E1169 Type 2 diabetes mellitus with other specified complication: Secondary | ICD-10-CM | POA: Diagnosis not present

## 2020-01-12 DIAGNOSIS — E7800 Pure hypercholesterolemia, unspecified: Secondary | ICD-10-CM | POA: Diagnosis not present

## 2020-01-12 DIAGNOSIS — E1169 Type 2 diabetes mellitus with other specified complication: Secondary | ICD-10-CM | POA: Diagnosis not present

## 2020-01-12 DIAGNOSIS — I639 Cerebral infarction, unspecified: Secondary | ICD-10-CM | POA: Diagnosis not present

## 2020-01-12 DIAGNOSIS — I10 Essential (primary) hypertension: Secondary | ICD-10-CM | POA: Diagnosis not present

## 2020-01-12 DIAGNOSIS — D649 Anemia, unspecified: Secondary | ICD-10-CM | POA: Diagnosis not present

## 2020-01-12 DIAGNOSIS — N183 Chronic kidney disease, stage 3 unspecified: Secondary | ICD-10-CM | POA: Diagnosis not present

## 2020-01-12 DIAGNOSIS — F0390 Unspecified dementia without behavioral disturbance: Secondary | ICD-10-CM | POA: Diagnosis not present

## 2020-01-12 DIAGNOSIS — F329 Major depressive disorder, single episode, unspecified: Secondary | ICD-10-CM | POA: Diagnosis not present

## 2020-01-17 NOTE — Telephone Encounter (Signed)
I called and spoke to [facility] and confirmed that memantine was 5mg  po BID and this is correct.  She verbalized understanding.

## 2020-01-17 NOTE — Telephone Encounter (Signed)
Phone rep checked office voicemail's, on yesterday @4 :16 [email or web address] left message asking for a call back to verify the dosing of pt's  memantine (NAMENDA) 5 MG tablet  please call

## 2020-01-23 DIAGNOSIS — M21611 Bunion of right foot: Secondary | ICD-10-CM | POA: Diagnosis not present

## 2020-01-23 DIAGNOSIS — M21612 Bunion of left foot: Secondary | ICD-10-CM | POA: Diagnosis not present

## 2020-01-23 DIAGNOSIS — M329 Systemic lupus erythematosus, unspecified: Secondary | ICD-10-CM | POA: Diagnosis not present

## 2020-01-23 DIAGNOSIS — M150 Primary generalized (osteo)arthritis: Secondary | ICD-10-CM | POA: Diagnosis not present

## 2020-01-23 DIAGNOSIS — B351 Tinea unguium: Secondary | ICD-10-CM | POA: Diagnosis not present

## 2020-01-23 DIAGNOSIS — E669 Obesity, unspecified: Secondary | ICD-10-CM | POA: Diagnosis not present

## 2020-01-23 DIAGNOSIS — M2041 Other hammer toe(s) (acquired), right foot: Secondary | ICD-10-CM | POA: Diagnosis not present

## 2020-01-23 DIAGNOSIS — R413 Other amnesia: Secondary | ICD-10-CM | POA: Diagnosis not present

## 2020-01-23 DIAGNOSIS — R430 Anosmia: Secondary | ICD-10-CM | POA: Diagnosis not present

## 2020-01-23 DIAGNOSIS — M5136 Other intervertebral disc degeneration, lumbar region: Secondary | ICD-10-CM | POA: Diagnosis not present

## 2020-01-23 DIAGNOSIS — M2042 Other hammer toe(s) (acquired), left foot: Secondary | ICD-10-CM | POA: Diagnosis not present

## 2020-01-23 DIAGNOSIS — I739 Peripheral vascular disease, unspecified: Secondary | ICD-10-CM | POA: Diagnosis not present

## 2020-01-23 DIAGNOSIS — N1832 Chronic kidney disease, stage 3b: Secondary | ICD-10-CM | POA: Diagnosis not present

## 2020-02-01 DIAGNOSIS — M3210 Systemic lupus erythematosus, organ or system involvement unspecified: Secondary | ICD-10-CM | POA: Diagnosis not present

## 2020-02-01 DIAGNOSIS — H353132 Nonexudative age-related macular degeneration, bilateral, intermediate dry stage: Secondary | ICD-10-CM | POA: Diagnosis not present

## 2020-02-01 DIAGNOSIS — E119 Type 2 diabetes mellitus without complications: Secondary | ICD-10-CM | POA: Diagnosis not present

## 2020-02-01 DIAGNOSIS — H40013 Open angle with borderline findings, low risk, bilateral: Secondary | ICD-10-CM | POA: Diagnosis not present

## 2020-02-02 DIAGNOSIS — R350 Frequency of micturition: Secondary | ICD-10-CM | POA: Diagnosis not present

## 2020-02-02 DIAGNOSIS — N401 Enlarged prostate with lower urinary tract symptoms: Secondary | ICD-10-CM | POA: Diagnosis not present

## 2020-02-14 NOTE — Progress Notes (Signed)
PATIENT: David Irwin DOB: January 18, 1946  REASON FOR VISIT: follow up HISTORY FROM: patient  Chief Complaint  Patient presents with  . Follow-up    pt here for a f/u on memory. Pts wife said his steps are getting shorter     HISTORY OF PRESENT ILLNESS: Today 02/15/20 David Irwin is a 74 y.o. male here today for follow up for seizures and memory. He continues levetiracetam 500mg  BID. No recent seizures. He is also taking Namenda 5mg  BID. He has not noted any benefit. Increased doses caused sleepiness. His wife hasn't noted any significant improvements. She feels that she is having to help him more with getting bathed and dressed. He has dentures and his wife usually cleans those. He has more difficulty standing. He reports that he feels weaker. He has difficulty standing for long periods of time . His wife feels that he is shuffling his feet. He has been slower in movements. No tremor. He is using his walker at home. Some days he can walk without it. No falls. Appetites is good. He has stopped drinking as much coffee and eating peanut butter sandwiches. He has lost 13 pounds in 8 months following diet changes. He does not like to drink water.   He is s/p right thalamic stroke and left frontal punctate infarct, 12/2018. He continues close follow up with PCP. He continues Plavix daily. BP has been good. On simvastatin 20mg . DM is well controlled. Last A1C 7. He feels that mood is fairly stable. He is quite and withdrawn in the office today. His wife reports that he used to be very active but since his stroke he has been sedentary and withdrawn. She feels that the pandemic has caused worsening. He does seem much more alert and energized when he gets to see friends or family. He was taking Zoloft in 2019 but was not well tolerated.   HISTORY: (copied from my note on 07/03/2019)  David Irwin is a 74 y.o. male here today for follow up of seizure and memory loss.  He continues levetiracetam 500 mg  twice daily.  No seizure activity noted.  Occasionally he will have a short episode of staring but uncertain if this is seizure.  This has occurred approximately 3 times since last visit.  No tonic-clonic movements.  No incontinence or tongue injury.  His wife reports that memory continues to decline.  He was last seen by Dr. Leta Baptist and advised to consider Namenda.  He does not drive.  He is able to perform ADLs at home independently.  His wife reports that he is more sleepy.  He is eating well.  No falls.  He does walk with a walker.  He continues close follow-up with primary care.  No strokelike symptoms since last being seen in April.  He continues simvastatin 20 mg as well as Plavix daily.  HISTORY: (copied from Dr Gladstone Lighter note on 01/23/2019)  - here for eval of new right thalamic stroke (March 2020);patient admitted to the hospital in March 2020 for left-sided weakness. - walking has improved; speech improving - tolerating meds - no seizures - memory loss stable   REVIEW OF SYSTEMS: Out of a complete 14 system review of symptoms, the patient complains only of the following symptoms, gait instability, memory loss, depression, weakness, and all other reviewed systems are negative.  ALLERGIES: Allergies  Allergen Reactions  . Liraglutide Other (See Comments)    hallucinations   . Shellfish Allergy Hives    HOME MEDICATIONS: Outpatient  Medications Prior to Visit  Medication Sig Dispense Refill  . acetaminophen (TYLENOL) 500 MG tablet Take 500 mg by mouth every 6 (six) hours as needed for headache (pain).    . clobetasol cream (TEMOVATE) 0.05 % Apply 1 application topically as needed (break out lupus reaction on scalp).    . clopidogrel (PLAVIX) 75 MG tablet Take 1 tablet (75 mg total) by mouth daily. 30 tablet 11  . fluticasone (FLONASE) 50 MCG/ACT nasal spray Place 2 sprays into both nostrils at bedtime as needed for allergies or rhinitis.   5  . glimepiride (AMARYL) 2 MG  tablet Take 2 mg by mouth daily with breakfast.    . hydroxychloroquine (PLAQUENIL) 200 MG tablet Take 200 mg by mouth 2 (two) times daily.     Marland Kitchen. levETIRAcetam (KEPPRA) 500 MG tablet Take 1 tablet (500 mg total) by mouth 2 (two) times daily. 180 tablet 1  . memantine (NAMENDA) 5 MG tablet Take 1 tablet (5 mg total) by mouth 2 (two) times daily. 60 tablet 6  . Multiple Vitamins-Minerals (ALIVE MENS ENERGY PO) Take 1 tablet by mouth daily. Gummie    . polyvinyl alcohol (ARTIFICIAL TEARS) 1.4 % ophthalmic solution Place 1 drop into both eyes daily as needed for dry eyes.    . potassium chloride SA (K-DUR,KLOR-CON) 20 MEQ tablet Take 20 mEq by mouth daily.     Marland Kitchen. PRESCRIPTION MEDICATION Inhale into the lungs at bedtime. CPAP    . simvastatin (ZOCOR) 20 MG tablet Take 20 mg by mouth at bedtime.     . tamsulosin (FLOMAX) 0.4 MG CAPS Take 0.4 mg by mouth daily.     Marland Kitchen. telmisartan-hydrochlorothiazide (MICARDIS HCT) 40-12.5 MG tablet Take 2 tablets by mouth daily.  4  . vitamin B-12 (CYANOCOBALAMIN) 1000 MCG tablet Take 1,000 mcg by mouth daily.    Marland Kitchen. omeprazole (PRILOSEC) 20 MG capsule Take 1 capsule (20 mg total) by mouth daily. 30 capsule 1   No facility-administered medications prior to visit.    PAST MEDICAL HISTORY: Past Medical History:  Diagnosis Date  . CVA (cerebral vascular accident) (HCC) 12/07/2018  . Diabetes mellitus without complication (HCC)   . Hypertension   . Lupus (HCC)   . Memory loss   . Murmur, cardiac   . Nocturia   . Seizure (HCC)     PAST SURGICAL HISTORY: Past Surgical History:  Procedure Laterality Date  . APPENDECTOMY    . EYE SURGERY      FAMILY HISTORY: Family History  Problem Relation Age of Onset  . Kidney disease Mother   . Dementia Father     SOCIAL HISTORY: Social History   Socioeconomic History  . Marital status: Married    Spouse name: [name]  . Number of children: 2  . Years of education: 12th  . Highest education level: Not on file    Occupational History  . Occupation: Retired  Tobacco Use  . Smoking status: Never Smoker  . Smokeless tobacco: Never Used  Substance and Sexual Activity  . Alcohol use: No  . Drug use: No  . Sexual activity: Not on file  Other Topics Concern  . Not on file  Social History Narrative   Patient lives at home with spouse.   Caffeine Use: 1-2 cups daily   12th grade   Social Determinants of Health   Financial Resource Strain:   . Difficulty of Paying Living Expenses:   Food Insecurity:   . Worried About Programme researcher, broadcasting/film/videounning Out of Food in the  Last Year:   . Ran Out of Food in the Last Year:   Transportation Needs:   . Freight forwarder (Medical):   Marland Kitchen Lack of Transportation (Non-Medical):   Physical Activity:   . Days of Exercise per Week:   . Minutes of Exercise per Session:   Stress:   . Feeling of Stress :   Social Connections:   . Frequency of Communication with Friends and Family:   . Frequency of Social Gatherings with Friends and Family:   . Attends Religious Services:   . Active Member of Clubs or Organizations:   . Attends Banker Meetings:   Marland Kitchen Marital Status:   Intimate Partner Violence:   . Fear of Current or Ex-Partner:   . Emotionally Abused:   Marland Kitchen Physically Abused:   . Sexually Abused:       PHYSICAL EXAM  Vitals:   02/15/20 1029  BP: 118/62  Pulse: 69  Temp: (!) 97 F (36.1 C)  Weight: 209 lb (94.8 kg)   Body mass index is 29.99 kg/m.  Generalized: Well developed, in no acute distress, depressed demeanor/flat affect  Cardiology: normal rate and rhythm, no murmur noted Respiratory: clear to auscultation bilaterally  Neurological examination  Mentation: Alert oriented to month and season, not oriented to year or date, he is oriented to place, and most history taking. Follows all commands speech and language fluent but slow to answer questions.  Cranial nerve II-XII: Pupils were equal round reactive to light. Extraocular movements were  full, visual field were full. Facial sensation and strength were normal. Head turning and shoulder shrug  were normal and symmetric. Motor: The motor testing reveals 5 over 5 strength of upper extremities, 4/5 bilateral hip flexion, 4+/5 bilateral lower extension and flexion. Bradykinesia noted with upper and lower extremities. No cogwheel rigidity.  Sensory: Sensory testing is intact to soft touch on all 4 extremities. No evidence of extinction is noted.  Coordination: Cerebellar testing reveals good finger-nose-finger and heel-to-shin bilaterally.  Gait and station: He had a difficult time pushing up to standing position. He stood upright for approx 30 seconds but wanted to sit down d/t reports of dizziness.    DIAGNOSTIC DATA (LABS, IMAGING, TESTING) - I reviewed patient records, labs, notes, testing and imaging myself where available.  MMSE - Mini Mental State Exam 07/03/2019 07/27/2018 12/16/2017  Not completed: (No Data) - -  Orientation to time 0 2 4  Orientation to Place 2 4 4   Registration 3 3 3   Attention/ Calculation 1 1 1   Recall 1 0 2  Language- name 2 objects 2 2 2   Language- repeat 1 1 0  Language- follow 3 step command 2 3 3   Language- follow 3 step command-comments he grabbed the paper with his left hand. - -  Language- read & follow direction 1 1 1   Write a sentence 1 0 1  Write a sentence-comments - no subject -  Copy design 0 0 0  Total score 14 17 21      Lab Results  Component Value Date   WBC 3.6 (L) 12/07/2018   HGB 12.3 (L) 12/07/2018   HCT 38.1 (L) 12/07/2018   MCV 96.5 12/07/2018   PLT 129 (L) 12/07/2018      Component Value Date/Time   NA 138 12/07/2018 2327   K 4.1 12/07/2018 2327   CL 106 12/07/2018 2327   CO2 25 12/07/2018 2327   GLUCOSE 190 (H) 12/07/2018 2327   BUN 20 12/07/2018 2327  CREATININE 1.51 (H) 12/07/2018 2327   CALCIUM 9.0 12/07/2018 2327   PROT 8.1 12/07/2018 1215   ALBUMIN 4.2 12/07/2018 1215   AST 23 12/07/2018 1215   ALT 19  12/07/2018 1215   ALKPHOS 55 12/07/2018 1215   BILITOT 0.9 12/07/2018 1215   GFRNONAA 46 (L) 12/07/2018 2327   GFRAA 53 (L) 12/07/2018 2327   Lab Results  Component Value Date   CHOL 119 12/08/2018   HDL 34 (L) 12/08/2018   LDLCALC 74 12/08/2018   TRIG 54 12/08/2018   CHOLHDL 3.5 12/08/2018   Lab Results  Component Value Date   HGBA1C 8.5 (H) 12/08/2018   No results found for: VITAMINB12 No results found for: TSH     ASSESSMENT AND PLAN 74 y.o. year old male  has a past medical history of CVA (cerebral vascular accident) (HCC) (12/07/2018), Diabetes mellitus without complication (HCC), Hypertension, Lupus (HCC), Memory loss, Murmur, cardiac, Nocturia, and Seizure (HCC). here with     ICD-10-CM   1. Seizure disorder (HCC)  G40.909   2. Moderate dementia without behavioral disturbance (HCC)  F03.90   3. Gait difficulty  R26.9 Ambulatory referral to Physical Therapy  4. Right thalamic stroke (HCC)  I63.9   5. Ischemic stroke of frontal lobe (HCC)  I63.9   6. Gait instability  R26.81   7. General weakness  R53.1   8. Depression, unspecified depression type  F32.9   9. Bradykinesia  R25.8     Trebor is doing well from a seizure perspective.  He continues to tolerate levetiracetam 500 mg twice daily we will continue current therapy.  He does not feel that Namenda has been very beneficial.  Memory seems to be fairly stable, however, I am unable to fully evaluate.  Patient is very quiet and withdrawn at today's visit and did not participate with MMSE testing.  At the end of the visit I was able to get him to communicate a little more.  He was oriented to month and season but not date.  Oriented to place and most history taking throughout our visit.  He has had more generalized weakness.  His wife has noticed that he is having more difficulty with standing and walking at times.  She contributes this to inactivity due to isolation from the pandemic.  Concerns of depression also  contributing.  Over the past few weeks, he has seemed much more motivated.  He expresses interest today in returning for physical therapy.  I am hopeful that with some strength and gait training, he may be more active at home which could help with symptoms.  I am concerned that his movements do seem to be slower than with his previous exam.  Unable to fully assess gait and balance.  He does not have a tremor.  I have discussed concerns of parkinsonian-like symptoms with his wife.  Symptoms could be correlated with depression and history of frontal stroke.  We have discussed consideration of adding Sinemet to see if this will help with movements and gait.  His wife is hesitant at this time.  We have also discussed adding Aricept, however, she remains hesitant about adding any additional medications.  She prefers to have him participate with physical therapy and reevaluate at her next follow-up.  He was encouraged to continue close follow-up with Dr. Tenny Craw for stroke prevention and monitoring of depression.  Adequate hydration, well-balanced diet and regular exercise encouraged.  I will see him back in the office in 3 months, sooner if  needed.  He and his wife both verbalized understanding and agreement with this plan.   Orders Placed This Encounter  Procedures  . Ambulatory referral to Physical Therapy    Referral Priority:   Routine    Referral Type:   Physical Medicine    Referral Reason:   Specialty Services Required    Requested Specialty:   Physical Therapy    Number of Visits Requested:   1     No orders of the defined types were placed in this encounter.     I spent 60 minutes with the patient. 50% of this time was spent counseling and educating patient on plan of care and medications.     Shawnie Dapper, FNP-C 02/15/2020, 12:13 PM [facility] [address] [place], [place] [address] [phone]

## 2020-02-15 ENCOUNTER — Other Ambulatory Visit: Payer: Self-pay

## 2020-02-15 ENCOUNTER — Ambulatory Visit: Payer: PPO | Admitting: Family Medicine

## 2020-02-15 ENCOUNTER — Encounter: Payer: Self-pay | Admitting: Family Medicine

## 2020-02-15 VITALS — BP 118/62 | HR 69 | Temp 97.0°F | Wt 209.0 lb

## 2020-02-15 DIAGNOSIS — R258 Other abnormal involuntary movements: Secondary | ICD-10-CM | POA: Diagnosis not present

## 2020-02-15 DIAGNOSIS — R2681 Unsteadiness on feet: Secondary | ICD-10-CM

## 2020-02-15 DIAGNOSIS — R269 Unspecified abnormalities of gait and mobility: Secondary | ICD-10-CM | POA: Diagnosis not present

## 2020-02-15 DIAGNOSIS — G40909 Epilepsy, unspecified, not intractable, without status epilepticus: Secondary | ICD-10-CM | POA: Diagnosis not present

## 2020-02-15 DIAGNOSIS — F329 Major depressive disorder, single episode, unspecified: Secondary | ICD-10-CM | POA: Diagnosis not present

## 2020-02-15 DIAGNOSIS — F03B0 Unspecified dementia, moderate, without behavioral disturbance, psychotic disturbance, mood disturbance, and anxiety: Secondary | ICD-10-CM

## 2020-02-15 DIAGNOSIS — I639 Cerebral infarction, unspecified: Secondary | ICD-10-CM

## 2020-02-15 DIAGNOSIS — R531 Weakness: Secondary | ICD-10-CM

## 2020-02-15 DIAGNOSIS — I6381 Other cerebral infarction due to occlusion or stenosis of small artery: Secondary | ICD-10-CM

## 2020-02-15 DIAGNOSIS — F32A Depression, unspecified: Secondary | ICD-10-CM

## 2020-02-15 DIAGNOSIS — F0390 Unspecified dementia without behavioral disturbance: Secondary | ICD-10-CM

## 2020-02-15 NOTE — Patient Instructions (Signed)
We will continue Namenda 5mg  twice daily and levetiracetam 500 twice daily.   We will refer you to neuro PT for strength and gait training.   Please continue to use your walker and make position changes slowly.   Stay well hydrated, well balanced diet and regular activity.   Continue discussion with PCP for any concerns of depression  Follow up with me in 3 months  Stroke Prevention Some medical conditions and lifestyle choices can lead to a higher risk for a stroke. You can help to prevent a stroke by making nutrition, lifestyle, and other changes. What nutrition changes can be made?   Eat healthy foods. ? Choose foods that are high in fiber. These include:  Fresh fruits.  Fresh vegetables.  Whole grains. ? Eat at least 5 or more servings of fruits and vegetables each day. Try to fill half of your plate at each meal with fruits and vegetables. ? Choose lean protein foods. These include:  Lowfat (lean) cuts of meat.  Chicken without skin.  Fish.  Tofu.  Beans.  Nuts. ? Eat low-fat dairy products. ? Avoid foods that:  Are high in salt (sodium).  Have saturated fat.  Have trans fat.  Have cholesterol.  Are processed.  Are premade.  Follow eating guidelines as told by your doctor. These may include: ? Reducing how many calories you eat and drink each day. ? Limiting how much salt you eat or drink each day to 1,500 milligrams (mg). ? Using only healthy fats for cooking. These include:  Olive oil.  Canola oil.  Sunflower oil. ? Counting how many carbohydrates you eat and drink each day. What lifestyle changes can be made?  Try to stay at a healthy weight. Talk to your doctor about what a good weight is for you.  Get at least 30 minutes of moderate physical activity at least 5 days a week. This can include: ? Fast walking. ? Biking. ? Swimming.  Do not use any products that have nicotine or tobacco. This includes cigarettes and e-cigarettes. If  you need help quitting, ask your doctor. Avoid being around tobacco smoke in general.  Limit how much alcohol you drink to no more than 1 drink a day for nonpregnant women and 2 drinks a day for men. One drink equals 12 oz of beer, 5 oz of wine, or 1 oz of hard liquor.  Do not use drugs.  Avoid taking birth control pills. Talk to your doctor about the risks of taking birth control pills if: ? You are over 52 years old. ? You smoke. ? You get migraines. ? You have had a blood clot. What other changes can be made?  Manage your cholesterol. ? It is important to eat a healthy diet. ? If your cholesterol cannot be managed through your diet, you may also need to take medicines. Take medicines as told by your doctor.  Manage your diabetes. ? It is important to eat a healthy diet and to exercise regularly. ? If your blood sugar cannot be managed through diet and exercise, you may need to take medicines. Take medicines as told by your doctor.  Control your high blood pressure (hypertension). ? Try to keep your blood pressure below 130/80. This can help lower your risk of stroke. ? It is important to eat a healthy diet and to exercise regularly. ? If your blood pressure cannot be managed through diet and exercise, you may need to take medicines. Take medicines as told by your  doctor. ? Ask your doctor if you should check your blood pressure at home. ? Have your blood pressure checked every year. Do this even if your blood pressure is normal.  Talk to your doctor about getting checked for a sleep disorder. Signs of this can include: ? Snoring a lot. ? Feeling very tired.  Take over-the-counter and prescription medicines only as told by your doctor. These may include aspirin or blood thinners (antiplatelets or anticoagulants).  Make sure that any other medical conditions you have are managed. Where to find more information  American Stroke Association: www.strokeassociation.org  National  Stroke Association: www.stroke.org Get help right away if:  You have any symptoms of stroke. "BE FAST" is an easy way to remember the main warning signs: ? B - Balance. Signs are dizziness, sudden trouble walking, or loss of balance. ? E - Eyes. Signs are trouble seeing or a sudden change in how you see. ? F - Face. Signs are sudden weakness or loss of feeling of the face, or the face or eyelid drooping on one side. ? A - Arms. Signs are weakness or loss of feeling in an arm. This happens suddenly and usually on one side of the body. ? S - Speech. Signs are sudden trouble speaking, slurred speech, or trouble understanding what people say. ? T - Time. Time to call emergency services. Write down what time symptoms started.  You have other signs of stroke, such as: ? A sudden, very bad headache with no known cause. ? Feeling sick to your stomach (nausea). ? Throwing up (vomiting). ? Jerky movements you cannot control (seizure). These symptoms may represent a serious problem that is an emergency. Do not wait to see if the symptoms will go away. Get medical help right away. Call your local emergency services (911 in the [place] Do not drive yourself to the hospital. Summary  You can prevent a stroke by eating healthy, exercising, not smoking, drinking less alcohol, and treating other health problems, such as diabetes, high blood pressure, or high cholesterol.  Do not use any products that contain nicotine or tobacco, such as cigarettes and e-cigarettes.  Get help right away if you have any signs or symptoms of a stroke. This information is not intended to replace advice given to you by your health care provider. Make sure you discuss any questions you have with your health care provider. Document Revised: 11/17/2018 Document Reviewed: 12/23/2016 Elsevier Patient Education  2020 Elsevier Inc.    Dementia Caregiver Guide Dementia is a term used to describe a number of symptoms that affect  memory and thinking. The most common symptoms include:  Memory loss.  Trouble with language and communication.  Trouble concentrating.  Poor judgment.  Problems with reasoning.  Child-like behavior and language.  Extreme anxiety.  Angry outbursts.  Wandering from home or public places. Dementia usually gets worse slowly over time. In the early stages, people with dementia can stay independent and safe with some help. In later stages, they need help with daily tasks such as dressing, grooming, and using the bathroom. How to help the person with dementia cope Dementia can be frightening and confusing. Here are some tips to help the person with dementia cope with changes caused by the disease. General tips  Keep the person on track with his or her routine.  Try to identify areas where the person may need help.  Be supportive, patient, calm, and encouraging.  Gently remind the person that adjusting to changes takes  time.  Help with the tasks that the person has asked for help with.  Keep the person involved in daily tasks and decisions as much as possible.  Encourage conversation, but try not to get frustrated or harried if the person struggles to find words or does not seem to appreciate your help. Communication tips  When the person is talking or seems frustrated, make eye contact and hold the person's hand.  Ask specific questions that need yes or no answers.  Use simple words, short sentences, and a calm voice. Only give one direction at a time.  When offering choices, limit them to just 1 or 2.  Avoid correcting the person in a negative way.  If the person is struggling to find the right words, gently try to help him or her. How to recognize symptoms of stress Symptoms of stress in caregivers include:  Feeling frustrated or angry with the person with dementia.  Denying that the person has dementia or that his or her symptoms will not improve.  Feeling  hopeless and unappreciated.  Difficulty sleeping.  Difficulty concentrating.  Feeling anxious, irritable, or depressed.  Developing stress-related health problems.  Feeling like you have too little time for your own life. Follow these instructions at home:   Make sure that you and the person you are caring for: ? Get regular sleep. ? Exercise regularly. ? Eat regular, nutritious meals. ? Drink enough fluid to keep your urine clear or pale yellow. ? Take over-the-counter and prescription medicines only as told by your health care providers. ? Attend all scheduled health care appointments.  Join a support group with others who are caregivers.  Ask about respite care resources so that you can have a regular break from the stress of caregiving.  Look for signs of stress in yourself and in the person you are caring for. If you notice signs of stress, take steps to manage it.  Consider any safety risks and take steps to avoid them.  Organize medications in a pill box for each day of the week.  Create a plan to handle any legal or financial matters. Get legal or financial advice if needed.  Keep a calendar in a central location to remind the person of appointments or other activities. Tips for reducing the risk of injury  Keep floors clear of clutter. Remove rugs, magazine racks, and floor lamps.  Keep hallways well lit, especially at night.  Put a handrail and nonslip mat in the bathtub or shower.  Put childproof locks on cabinets that contain dangerous items, such as medicines, alcohol, guns, toxic cleaning items, sharp tools or utensils, matches, and lighters.  Put the locks in places where the person cannot see or reach them easily. This will help ensure that the person does not wander out of the house and get lost.  Be prepared for emergencies. Keep a list of emergency phone numbers and addresses in a convenient area.  Remove car keys and lock garage doors so that the  person does not try to get in the car and drive.  Have the person wear a bracelet that tracks locations and identifies the person as having memory problems. This should be worn at all times for safety. Where to find support: Many individuals and organizations offer support. These include:  Support groups for people with dementia and for caregivers.  Counselors or therapists.  Home health care services.  Adult day care centers. Where to find more information Alzheimer's Association: [employer] Contact a  health care provider if:  The person's health is rapidly getting worse.  You are no longer able to care for the person.  Caring for the person is affecting your physical and emotional health.  The person threatens himself or herself, you, or anyone else. Summary  Dementia is a term used to describe a number of symptoms that affect memory and thinking.  Dementia usually gets worse slowly over time.  Take steps to reduce the person's risk of injury, and to plan for future care.  Caregivers need support, relief from caregiving, and time for their own lives. This information is not intended to replace advice given to you by your health care provider. Make sure you discuss any questions you have with your health care provider. Document Revised: 09/03/2017 Document Reviewed: 08/25/2016 Elsevier Patient Education  2020 [employer].

## 2020-02-23 DIAGNOSIS — I739 Peripheral vascular disease, unspecified: Secondary | ICD-10-CM | POA: Diagnosis not present

## 2020-02-27 DIAGNOSIS — F0390 Unspecified dementia without behavioral disturbance: Secondary | ICD-10-CM | POA: Diagnosis not present

## 2020-02-27 DIAGNOSIS — N183 Chronic kidney disease, stage 3 unspecified: Secondary | ICD-10-CM | POA: Diagnosis not present

## 2020-02-27 DIAGNOSIS — D649 Anemia, unspecified: Secondary | ICD-10-CM | POA: Diagnosis not present

## 2020-02-27 DIAGNOSIS — I639 Cerebral infarction, unspecified: Secondary | ICD-10-CM | POA: Diagnosis not present

## 2020-02-27 DIAGNOSIS — I10 Essential (primary) hypertension: Secondary | ICD-10-CM | POA: Diagnosis not present

## 2020-02-27 DIAGNOSIS — F329 Major depressive disorder, single episode, unspecified: Secondary | ICD-10-CM | POA: Diagnosis not present

## 2020-02-27 DIAGNOSIS — E1169 Type 2 diabetes mellitus with other specified complication: Secondary | ICD-10-CM | POA: Diagnosis not present

## 2020-02-27 DIAGNOSIS — E7800 Pure hypercholesterolemia, unspecified: Secondary | ICD-10-CM | POA: Diagnosis not present

## 2020-03-01 DIAGNOSIS — E1169 Type 2 diabetes mellitus with other specified complication: Secondary | ICD-10-CM | POA: Diagnosis not present

## 2020-03-07 NOTE — Progress Notes (Signed)
I reviewed note and agree with plan.   Dariella Gillihan R. Lurline Caver, MD 03/07/2020, 4:22 PM Certified in Neurology, Neurophysiology and Neuroimaging  [facility] [address], [address] [place],  [address] [phone]  

## 2020-03-25 ENCOUNTER — Other Ambulatory Visit: Payer: Self-pay

## 2020-03-25 ENCOUNTER — Encounter: Payer: Self-pay | Admitting: Physical Therapy

## 2020-03-25 ENCOUNTER — Ambulatory Visit: Payer: PPO | Attending: Family Medicine | Admitting: Physical Therapy

## 2020-03-25 DIAGNOSIS — M6281 Muscle weakness (generalized): Secondary | ICD-10-CM | POA: Insufficient documentation

## 2020-03-25 DIAGNOSIS — R2689 Other abnormalities of gait and mobility: Secondary | ICD-10-CM | POA: Diagnosis not present

## 2020-03-25 DIAGNOSIS — R2681 Unsteadiness on feet: Secondary | ICD-10-CM

## 2020-03-25 DIAGNOSIS — R29818 Other symptoms and signs involving the nervous system: Secondary | ICD-10-CM | POA: Insufficient documentation

## 2020-03-25 NOTE — Therapy (Signed)
[facility] [address] [place], [place], [address] Phone: [phone]   Fax:  [phone]  Physical Therapy Evaluation  Patient Details  Name: David Irwin MRN: [id number] Date of Birth: 1946-03-30 Referring Provider (PT): Shawnie Dapper, NP   Encounter Date: 03/25/2020   PT End of Session - 03/25/20 1652    Visit Number 1    Number of Visits 17    Date for PT Re-Evaluation 06/23/20   written for 60 day POC   Authorization Type [employer] PPO - $15 co pay    PT Start Time 1243   pt arrived late   PT Stop Time 1316    PT Time Calculation (min) 33 min    Equipment Utilized During Treatment Gait belt    Activity Tolerance Patient tolerated treatment well    Behavior During Therapy [facility] for tasks assessed/performed           Past Medical History:  Diagnosis Date  . CVA (cerebral vascular accident) (HCC) 12/07/2018  . Diabetes mellitus without complication (HCC)   . Hypertension   . Lupus (HCC)   . Memory loss   . Murmur, cardiac   . Nocturia   . Seizure [facility])     Past Surgical History:  Procedure Laterality Date  . APPENDECTOMY    . EYE SURGERY      There were no vitals filed for this visit.    Subjective Assessment - 03/25/20 1245    Subjective Pt having difficulty with walking, standing for prolonged periods of time, weakness. Whenver therapist asks a question, pt responds "better ask [name]" (his wife), due to pt unable to recall information. Per pt's wife, he is not moving much. Does not use his RW all the time, also has a rollator. Today is a more slower day. Wife reports that his seizures are more like a stare and last a minute or 2, haven't had any in the past couple of weeks    Patient is accompained by: Family member   wife, [name]   Pertinent History CVA (12/2018)-right thalamic stroke and left frontal punctate infarct, , diabetes mellitus, HTN, lupus, seizure, depression    Patient Stated  Goals "wants a normal life", wants to walk normal    Currently in Pain? No/denies              [facility] PT Assessment - 03/25/20 1252      Assessment   Medical Diagnosis gait instability   hx of CVA   Referring Provider (PT) Shawnie Dapper, NP    Onset Date/Surgical Date 12/24/18    Hand Dominance Right    Prior Therapy had HHPT for about 3-4 weeks after pt's CVA      Precautions   Precautions Fall      Balance Screen   Has the patient fallen in the past 6 months No    Has the patient had a decrease in activity level because of a fear of falling?  Yes   medication makes him sleepy   Is the patient reluctant to leave their home because of a fear of falling?  No      Home Environment   Living Environment Private residence    Living Arrangements Spouse/significant other    Type of Home House    Home Access Stairs to enter    Entrance Stairs-Number of Steps 6    Entrance Stairs-Rails Can reach both    Home Layout One level    Home  Equipment Walker - standard;Other (comment);Shower Geophysicist/field seismologist, working on [employer]   Additional Comments wife bathes him, dresses him, cooking, pt is still feeding himself      Prior Function   Level of Independence Needs assistance with ADLs;Independent with household mobility with device    Leisure wants to get back to washing his car and cutting grass      Cognition   Overall Cognitive Status History of cognitive impairments - at baseline    Behaviors --   wife answers all questions, pt does not remember     Sensation   Light Touch Appears Intact    Proprioception Appears Intact      Coordination   Gross Motor Movements are Fluid and Coordinated No    Heel Shin Test limited due to weakness, performs slowly      Posture/Postural Control   Posture/Postural Control Postural limitations    Postural Limitations Forward head;Rounded Shoulders      ROM / Strength   AROM / PROM / Strength Strength      Strength   Strength Assessment Site  Hip;Knee;Ankle    Right/Left Hip Right;Left    Right Hip Flexion 4-/5    Left Hip Flexion 3-/5    Right/Left Knee Right;Left    Right Knee Flexion 4/5    Right Knee Extension 4+/5    Left Knee Flexion 4-/5    Left Knee Extension 3+/5    Right/Left Ankle Right;Left    Right Ankle Dorsiflexion 4+/5    Left Ankle Dorsiflexion 4+/5      Transfers   Transfers Sit to Stand;Stand to Sit    Sit to Stand 4: Min guard;With upper extremity assist;From chair/3-in-1    Five time sit to stand comments  1 minute 9 seconds    Stand to Sit 4: Min guard;With upper extremity assist;To chair/3-in-1    Comments during a couple reps pt using BUE to pull up from RW to stand,  performs with wide BOS      Ambulation/Gait   Ambulation/Gait Yes    Ambulation/Gait Assistance 4: Min guard;5: Supervision    Ambulation Distance (Feet) 35 Feet   x2   Assistive device Rolling walker    Gait Pattern Step-through pattern;Step-to pattern;Decreased stride length;Decreased stance time - left;Decreased weight shift to left;Right foot flat;Left foot flat;Shuffle;Trunk flexed    Ambulation Surface Level;Indoor                      Objective measurements completed on examination: See above findings.               PT Education - 03/25/20 1652    Education Details clinical findings, POC    Person(s) Educated Spouse;Patient    Methods Explanation    Comprehension Verbalized understanding            PT Short Term Goals - 03/25/20 1705      PT SHORT TERM GOAL #1   Title Pt and wife will be independent with initial HEP in order to build upon functional gains made in therapy. ALL STGS DUE 04/22/20    Time 4    Period Weeks    Status New    Target Date 04/22/20      PT SHORT TERM GOAL #2   Title Pt will undergo further assessment of stairs - LTG written as appropriate.    Time 4    Period Weeks    Status New  PT SHORT TERM GOAL #3   Title Pt will undergo assessment of TUG with RW  in order to determine fall risk - STG and LTG to be written as appropriate.    Time 4    Period Weeks    Status New      PT SHORT TERM GOAL #4   Title Pt will undergo assessment of gait speed with RW in order to determine fall risk - STG and LTG to be written as appropriate.    Time 4    Period Weeks    Status New      PT SHORT TERM GOAL #5   Title Pt will decr 5x sit <> stand time to 55 seconds or less using BUE support from standard height chair in order to improve functional BLE strength.    Baseline 1 minute and 9 seconds    Time 4    Period Weeks    Status New             PT Long Term Goals - 03/25/20 1707      PT LONG TERM GOAL #1   Title Pt and wife will be independent with final HEP in order to build upon functional gains made in therapy. ALL STGS DUE 05/20/20    Time 8    Period Weeks    Status New    Target Date 05/20/20      PT LONG TERM GOAL #2   Title Stair goal to be written as appropriate for pt to safely enter/exit home.    Time 8    Period Weeks    Status New      PT LONG TERM GOAL #3   Title Pt will undergo assessment of TUG with RW in order to determine fall risk - LTG to be written as appropriate.    Time 8    Period Weeks    Status New      PT LONG TERM GOAL #4   Title Pt will undergo assessment of gait speed with RW in order to determine fall risk -  LTG to be written as appropriate.    Time 8    Period Weeks    Status New      PT LONG TERM GOAL #5   Title Pt will decr 5x sit <> stand time to 45 seconds or less using BUE support from standard height chair in order to improve functional BLE strength.    Baseline 1 minute and 9 seconds    Time 8    Period Weeks    Status New                  Plan - 03/25/20 1655    Clinical Impression Statement Patient is a 74 year old male referred to Neuro OPPT for gait instability. Pt's PMH is significant for: CVA (12/2018)-right thalamic stroke and left frontal punctate infarct, diabetes  mellitus, HTN, lupus, seizure, depression. Pt with cognitive impairments - needs wife to answer the majority of questions for him due to pt reporting that he does not remember. The following deficits were present during the exam: decr endurance, decr BLE strength, decr balance, gait abnormalities, postural deficits.  Based on 5x sit <> stand pt is at a high risk for falls. Unable to assess TUG and gait speed today due to time constraints. However, pt ambulates with slow, shuffled steps with step to pattern due to decr stance time on LLE with RW and needed intermittent standing  rest breaks while ambulating back to treatment room (approx. 35'). Pt would benefit from skilled PT to address these impairments and functional limitations to maximize functional mobility independence    Personal Factors and Comorbidities Behavior Pattern;Comorbidity 3+;Time since onset of injury/illness/exacerbation;Past/Current Experience;Fitness    Comorbidities PMH: CVA (12/2018)-right thalamic stroke and left frontal punctate infarct, , diabetes mellitus, HTN, lupus, seizure, depression    Examination-Activity Limitations Bathing;Bed Mobility;Locomotion Level;Squat;Transfers;Stand;Stairs    Stability/Clinical Decision Making Evolving/Moderate complexity    Clinical Decision Making Moderate    Rehab Potential Fair    PT Frequency 2x / week    PT Duration 8 weeks    PT Treatment/Interventions ADLs/Self Care Home Management;DME Instruction;Stair training;Gait training;Functional mobility training;Therapeutic activities;Therapeutic exercise;Balance training;Neuromuscular re-education;Patient/family education;Passive range of motion;Vestibular    PT Next Visit Plan perform TUG and gait speed. pt ambulates slowly (might save time to wheel pt back to therapy gym in wheelchair). initial HEP (sit <> stands and seated strengthening would probably be best). assess stairs (when able)    Consulted and Agree with Plan of Care Family  member/caregiver;Patient    Family Member Consulted wife, [name]           Patient will benefit from skilled therapeutic intervention in order to improve the following deficits and impairments:  Abnormal gait, Decreased balance, Decreased activity tolerance, Decreased coordination, Decreased cognition, Decreased endurance, Decreased knowledge of use of DME, Decreased range of motion, Decreased strength, Difficulty walking, Postural dysfunction  Visit Diagnosis: Unsteadiness on feet  Muscle weakness (generalized)  Other abnormalities of gait and mobility  Other symptoms and signs involving the nervous system     Problem List Patient Active Problem List   Diagnosis Date Noted  . Acute ischemic right MCA stroke ([place]) 12/07/2018  . Stroke ([place]) 12/07/2018  . Moderate dementia without behavioral disturbance ([place]) 07/27/2018  . New onset seizure ([place]) 07/27/2018  . Depression 12/16/2017  . Essential hypertension 03/26/2017  . Hyperlipidemia 03/26/2017  . Cardiac murmur 03/26/2017  . Mild cognitive impairment, so stated 05/01/2013  . Memory loss 05/01/2013    Arliss Journey, PT, DPT  03/25/2020, 5:09 PM  [facility] [address] [place], [place], [address] Phone: [phone]   Fax:  [phone]  Name: David Irwin MRN: [id number] Date of Birth: 1946-04-03

## 2020-03-26 ENCOUNTER — Other Ambulatory Visit: Payer: Self-pay

## 2020-03-26 NOTE — Patient Outreach (Signed)
Aging Gracefully Program   03/26/2020  Kaveon Blatz [birth year] [phone]  [facility] Evaluation Interviewer made contact with patient. Aging Gracefully survey completed.   Patient did not pass SPMSQ questionnaire.   Interviewer will send referral to back to Rod Holler with [employer] for follow up.   Baruch Gouty Care Management Assistant [phone]

## 2020-03-27 ENCOUNTER — Encounter: Payer: Self-pay | Admitting: Physical Therapy

## 2020-03-27 ENCOUNTER — Ambulatory Visit: Payer: PPO | Admitting: Physical Therapy

## 2020-03-27 ENCOUNTER — Other Ambulatory Visit: Payer: Self-pay

## 2020-03-27 DIAGNOSIS — R2681 Unsteadiness on feet: Secondary | ICD-10-CM | POA: Diagnosis not present

## 2020-03-27 DIAGNOSIS — M6281 Muscle weakness (generalized): Secondary | ICD-10-CM

## 2020-03-27 DIAGNOSIS — R29818 Other symptoms and signs involving the nervous system: Secondary | ICD-10-CM

## 2020-03-27 DIAGNOSIS — R2689 Other abnormalities of gait and mobility: Secondary | ICD-10-CM

## 2020-03-27 NOTE — Therapy (Addendum)
[facility] [address] [place], [place], [address] Phone: [phone]   Fax:  [phone]  Physical Therapy Treatment  Patient Details  Name: David Irwin MRN: [id number] Date of Birth: [birth year] Referring Provider (PT): Shawnie Dapper, NP   Encounter Date: 03/27/2020   PT End of Session - 03/27/20 1410    Visit Number 2    Number of Visits 17    Date for PT Re-Evaluation 06/23/20   written for 60 day POC   Authorization Type [employer] PPO - $15 co pay    PT Start Time 1402    PT Stop Time 1445    PT Time Calculation (min) 43 min    Equipment Utilized During Treatment Gait belt    Activity Tolerance Patient tolerated treatment well    Behavior During Therapy [facility] for tasks assessed/performed           Past Medical History:  Diagnosis Date  . CVA (cerebral vascular accident) (HCC) 12/07/2018  . Diabetes mellitus without complication (HCC)   . Hypertension   . Lupus (HCC)   . Memory loss   . Murmur, cardiac   . Nocturia   . Seizure [facility])     Past Surgical History:  Procedure Laterality Date  . APPENDECTOMY    . EYE SURGERY      There were no vitals filed for this visit.   Subjective Assessment - 03/27/20 1409    Subjective No new complaints. No falls or pain to report.    Patient is accompained by: Family member   spouse, David Irwin   Pertinent History CVA (12/2018)-right thalamic stroke and left frontal punctate infarct, , diabetes mellitus, HTN, lupus, seizure, depression    Patient Stated Goals "wants a normal life", wants to walk normal              [facility] PT Assessment - 03/27/20 1411      Standardized Balance Assessment   Standardized Balance Assessment Timed Up and Go Test;10 meter walk test    10 Meter Walk 1:24:69 (84.69 sec's)= 0.39 ft/sec with RW, min guard assist      Timed Up and Go Test   TUG Normal TUG    Normal TUG (seconds) --   1.55.90 sec's with RW, min guard assist               [facility] Adult PT Treatment/Exercise - 03/27/20 1423      Transfers   Transfers Sit to Stand;Stand to Sit    Sit to Stand 4: Min guard;With upper extremity assist;From chair/3-in-1    Stand to Sit 4: Min guard;With upper extremity assist;To chair/3-in-1      Ambulation/Gait   Ambulation/Gait Yes    Ambulation/Gait Assistance 4: Min guard    Ambulation/Gait Assistance Details around gym with session    Assistive device Rolling walker    Gait Pattern Step-through pattern;Step-to pattern;Decreased stride length;Decreased stance time - left;Decreased weight shift to left;Right foot flat;Left foot flat;Shuffle;Trunk flexed    Ambulation Surface Level;Indoor      Exercises   Exercises Other Exercises    Other Exercises  issued ex's for strengthening. refer to [employer] program for full details. cues needed on ex form and tecnhnique.            Issued to HEP today:  Access Code: [email or web address] URL: [email or web address] Date: 03/27/2020 Prepared by: Sallyanne Kuster  Exercises Seated Heel Toe Raises - 1 x daily - 5 x weekly - 1 sets -  10 reps Seated Knee Extension with Resistance - 1 x daily - 5 x weekly - 1 sets - 10 reps Seated Knee Flexion with Anchored Resistance - 1 x daily - 5 x weekly - 1 sets - 10 reps Seated March with Resistance - 1 x daily - 5 x weekly - 1 sets - 10 reps Seated Hip Abduction with Resistance - 1 x daily - 5 x weekly - 1 sets - 10 reps         PT Education - 03/27/20 2010    Education Details results of gait speed test and timed up and go; initial HEP for strengthening    Person(s) Educated Patient;Spouse    Methods Explanation;Demonstration;Verbal cues;Handout    Comprehension Verbalized understanding;Returned demonstration;Verbal cues required;Need further instruction               PT Short Term Goals - 04/03/20 1156      PT SHORT TERM GOAL #1   Title Pt and wife will be independent with initial HEP in order to build upon  functional gains made in therapy. ALL STGS DUE 04/22/20    Time 4    Period Weeks    Status New    Target Date 04/22/20      PT SHORT TERM GOAL #2   Title Pt will undergo further assessment of stairs - LTG written as appropriate.    Time 4    Period Weeks    Status New      PT SHORT TERM GOAL #3   Title Pt will decr TUG time to 1 minute and 40 seconds with RW in order to decr fall risk.    Baseline 1 minute and 55.9 seconds    Time 4    Period Weeks    Status Revised      PT SHORT TERM GOAL #4   Title Pt will improve gait speed to at least .6 ft/sec with RW in order to decr fall risk.    Baseline .39 ft/sec    Time 4    Period Weeks    Status Revised      PT SHORT TERM GOAL #5   Title Pt will decr 5x sit <> stand time to 55 seconds or less using BUE support from standard height chair in order to improve functional BLE strength.    Baseline 1 minute and 9 seconds    Time 4    Period Weeks    Status New           PT Long Term Goals - 04/03/20 1158      PT LONG TERM GOAL #1   Title Pt and wife will be independent with final HEP in order to build upon functional gains made in therapy. ALL STGS DUE 05/20/20    Time 8    Period Weeks    Status New      PT LONG TERM GOAL #2   Title Stair goal to be written as appropriate for pt to safely enter/exit home.    Time 8    Period Weeks    Status New      PT LONG TERM GOAL #3   Title Pt will decr TUG time to 1 minute and 20 seconds with RW in order to decr fall risk    Baseline 1 minute and 55.9 seconds    Time 8    Period Weeks    Status Revised      PT LONG TERM GOAL #4  Title Pt will improve gait speed to at least .8 ft/sec with RW in order to decr fall risk.with RW in order to determine fall risk -  LTG to be written as appropriate.    Baseline .39 ft/sec    Time 8    Period Weeks    Status Revised      PT LONG TERM GOAL #5   Title Pt will decr 5x sit <> stand time to 45 seconds or less using BUE support from  standard height chair in order to improve functional BLE strength.    Baseline 1 minute and 9 seconds    Time 8    Period Weeks    Status New               Plan - 03/27/20 1410    Clinical Impression Statement Today's skilled session initially focused on establishing baseline values for gait speed and timed up and go. Both values place pt in a high fall risk. Remainder of session focused on establishment of an HEP for strengthening. No issues reported or noted in session. The pt is progressing toward goals and should benefit from continued PT to progress toward unmet goals. Primary PT to revise goals based on outcome measures assessed today.    Personal Factors and Comorbidities Behavior Pattern;Comorbidity 3+;Time since onset of injury/illness/exacerbation;Past/Current Experience;Fitness    Comorbidities PMH: CVA (12/2018)-right thalamic stroke and left frontal punctate infarct, , diabetes mellitus, HTN, lupus, seizure, depression    Examination-Activity Limitations Bathing;Bed Mobility;Locomotion Level;Squat;Transfers;Stand;Stairs    Stability/Clinical Decision Making Evolving/Moderate complexity    Rehab Potential Fair    PT Frequency 2x / week    PT Duration 8 weeks    PT Treatment/Interventions ADLs/Self Care Home Management;DME Instruction;Stair training;Gait training;Functional mobility training;Therapeutic activities;Therapeutic exercise;Balance training;Neuromuscular re-education;Patient/family education;Passive range of motion;Vestibular    PT Next Visit Plan pt ambulates slowly (might save time to wheel pt back to therapy gym in wheelchair). continue to work on LE strenthening, begin to work on standing tolerance/balance with support progressing to decreased support. assess stairs (when able)    PT Home Exercise Plan Access Code: [email or web address]    Consulted and Agree with Plan of Care Family member/caregiver;Patient    Family Member Consulted wife, David Irwin           Patient will  benefit from skilled therapeutic intervention in order to improve the following deficits and impairments:  Abnormal gait, Decreased balance, Decreased activity tolerance, Decreased coordination, Decreased cognition, Decreased endurance, Decreased knowledge of use of DME, Decreased range of motion, Decreased strength, Difficulty walking, Postural dysfunction  Visit Diagnosis: Unsteadiness on feet  Muscle weakness (generalized)  Other abnormalities of gait and mobility  Other symptoms and signs involving the nervous system     Problem List Patient Active Problem List   Diagnosis Date Noted  . Acute ischemic right MCA stroke (HCC) 12/07/2018  . Stroke (HCC) 12/07/2018  . Moderate dementia without behavioral disturbance (HCC) 07/27/2018  . New onset seizure (HCC) 07/27/2018  . Depression 12/16/2017  . Essential hypertension 03/26/2017  . Hyperlipidemia 03/26/2017  . Cardiac murmur 03/26/2017  . Mild cognitive impairment, so stated 05/01/2013  . Memory loss 05/01/2013    Sallyanne Kuster, PTA, [facility] [address], [address] [place], [place] [address] [phone] 03/28/20, 8:17 PM   Name: Xzavion Doswell MRN: [id number] Date of Birth: 1946/01/25    Sherlie Ban, PT, DPT 04/03/20 11:55 AM

## 2020-03-27 NOTE — Patient Instructions (Signed)
Access Code: [email or web address] URL: [email or web address] Date: 03/27/2020 Prepared by: Sallyanne Kuster  Exercises Seated Heel Toe Raises - 1 x daily - 5 x weekly - 1 sets - 10 reps Seated Knee Extension with Resistance - 1 x daily - 5 x weekly - 1 sets - 10 reps Seated Knee Flexion with Anchored Resistance - 1 x daily - 5 x weekly - 1 sets - 10 reps Seated March with Resistance - 1 x daily - 5 x weekly - 1 sets - 10 reps Seated Hip Abduction with Resistance - 1 x daily - 5 x weekly - 1 sets - 10 reps

## 2020-04-02 ENCOUNTER — Encounter: Payer: Self-pay | Admitting: Physical Therapy

## 2020-04-02 ENCOUNTER — Ambulatory Visit: Payer: PPO | Admitting: Physical Therapy

## 2020-04-02 ENCOUNTER — Other Ambulatory Visit: Payer: Self-pay

## 2020-04-02 DIAGNOSIS — R2689 Other abnormalities of gait and mobility: Secondary | ICD-10-CM

## 2020-04-02 DIAGNOSIS — E1169 Type 2 diabetes mellitus with other specified complication: Secondary | ICD-10-CM | POA: Diagnosis not present

## 2020-04-02 DIAGNOSIS — R2681 Unsteadiness on feet: Secondary | ICD-10-CM

## 2020-04-02 DIAGNOSIS — M6281 Muscle weakness (generalized): Secondary | ICD-10-CM

## 2020-04-03 DIAGNOSIS — N183 Chronic kidney disease, stage 3 unspecified: Secondary | ICD-10-CM | POA: Diagnosis not present

## 2020-04-03 DIAGNOSIS — D649 Anemia, unspecified: Secondary | ICD-10-CM | POA: Diagnosis not present

## 2020-04-03 DIAGNOSIS — F0390 Unspecified dementia without behavioral disturbance: Secondary | ICD-10-CM | POA: Diagnosis not present

## 2020-04-03 DIAGNOSIS — F329 Major depressive disorder, single episode, unspecified: Secondary | ICD-10-CM | POA: Diagnosis not present

## 2020-04-03 DIAGNOSIS — E7800 Pure hypercholesterolemia, unspecified: Secondary | ICD-10-CM | POA: Diagnosis not present

## 2020-04-03 DIAGNOSIS — I639 Cerebral infarction, unspecified: Secondary | ICD-10-CM | POA: Diagnosis not present

## 2020-04-03 DIAGNOSIS — I10 Essential (primary) hypertension: Secondary | ICD-10-CM | POA: Diagnosis not present

## 2020-04-03 DIAGNOSIS — E1169 Type 2 diabetes mellitus with other specified complication: Secondary | ICD-10-CM | POA: Diagnosis not present

## 2020-04-03 NOTE — Therapy (Signed)
[place] [address] [place], [place], [address] Phone: [phone]   Fax:  [phone]  Physical Therapy Treatment  Patient Details  Name: David Irwin MRN: [id number] Date of Birth: [birth year] Referring Provider (PT): Debbora Presto, NP   Encounter Date: 04/02/2020   PT End of Session - 04/02/20 1118    Visit Number 3    Number of Visits 17    Date for PT Re-Evaluation 06/23/20   written for 60 day POC   Authorization Type [employer] PPO - $15 co pay    PT Start Time 1109   pt. late, PTA met in lobby. spouse signed him in after moving the car   PT Stop Time 1142    PT Time Calculation (min) 33 min    Equipment Utilized During Treatment Gait belt    Activity Tolerance Patient tolerated treatment well    Behavior During Therapy WFL for tasks assessed/performed           Past Medical History:  Diagnosis Date  . CVA (cerebral vascular accident) ([place]) 12/07/2018  . Diabetes mellitus without complication ([place])   . Hypertension   . Lupus ([place])   . Memory loss   . Murmur, cardiac   . Nocturia   . Seizure [facility])     Past Surgical History:  Procedure Laterality Date  . APPENDECTOMY    . EYE SURGERY      There were no vitals filed for this visit.   Subjective Assessment - 04/02/20 1117    Subjective No new complaints. No falls or pain to report.    Patient is accompained by: Family member   spouse, [name]   Pertinent History CVA (12/2018)-right thalamic stroke and left frontal punctate infarct, , diabetes mellitus, HTN, lupus, seizure, depression    Patient Stated Goals "wants a normal life", wants to walk normal    Currently in Pain? No/denies                  [facility] Adult PT Treatment/Exercise - 04/02/20 1120      Transfers   Transfers Sit to Stand;Stand to Sit    Sit to Stand 4: Min guard;With upper extremity assist;From chair/3-in-1    Stand to Sit 4: Min guard;With upper extremity  assist;To chair/3-in-1      Ambulation/Gait   Ambulation/Gait Yes    Ambulation/Gait Assistance 4: Min guard;5: Supervision    Ambulation/Gait Assistance Details mod cues needed on posture, increased step length and walker position. Pt with significantly slow cadence    Ambulation Distance (Feet) 50 Feet   x1   Assistive device Rolling walker    Gait Pattern Step-through pattern;Step-to pattern;Decreased stride length;Decreased stance time - left;Decreased weight shift to left;Right foot flat;Left foot flat;Shuffle;Trunk flexed    Ambulation Surface Level;Indoor      Exercises   Exercises Other Exercises    Other Exercises  standing with UE support on chair back: bil LE heel raises for 10 reps, then left marching for 10 reps. pt unable to lift right, needing to sit for a rest break. after a rest- pt did marching for 10 reps with right LE, then needing rest break again. after this seated rest break pt was able to stand for mini squats for 10 reps with UE support; seated at edge of mat: with red band for rows x 10 reps, then shoulder horizonal abduction for 10 reps, cues on form and to task. then with 2# weighted dowel rod for  UE raises for 10 reps with cues to task and form .                    PT Short Term Goals - 03/25/20 1705      PT SHORT TERM GOAL #1   Title Pt and wife will be independent with initial HEP in order to build upon functional gains made in therapy. ALL STGS DUE 04/22/20    Time 4    Period Weeks    Status New    Target Date 04/22/20      PT SHORT TERM GOAL #2   Title Pt will undergo further assessment of stairs - LTG written as appropriate.    Time 4    Period Weeks    Status New      PT SHORT TERM GOAL #3   Title Pt will undergo assessment of TUG with RW in order to determine fall risk - STG and LTG to be written as appropriate.    Time 4    Period Weeks    Status New      PT SHORT TERM GOAL #4   Title Pt will undergo assessment of gait speed with  RW in order to determine fall risk - STG and LTG to be written as appropriate.    Time 4    Period Weeks    Status New      PT SHORT TERM GOAL #5   Title Pt will decr 5x sit <> stand time to 55 seconds or less using BUE support from standard height chair in order to improve functional BLE strength.    Baseline 1 minute and 9 seconds    Time 4    Period Weeks    Status New             PT Long Term Goals - 03/25/20 1707      PT LONG TERM GOAL #1   Title Pt and wife will be independent with final HEP in order to build upon functional gains made in therapy. ALL STGS DUE 05/20/20    Time 8    Period Weeks    Status New    Target Date 05/20/20      PT LONG TERM GOAL #2   Title Stair goal to be written as appropriate for pt to safely enter/exit home.    Time 8    Period Weeks    Status New      PT LONG TERM GOAL #3   Title Pt will undergo assessment of TUG with RW in order to determine fall risk - LTG to be written as appropriate.    Time 8    Period Weeks    Status New      PT LONG TERM GOAL #4   Title Pt will undergo assessment of gait speed with RW in order to determine fall risk -  LTG to be written as appropriate.    Time 8    Period Weeks    Status New      PT LONG TERM GOAL #5   Title Pt will decr 5x sit <> stand time to 45 seconds or less using BUE support from standard height chair in order to improve functional BLE strength.    Baseline 1 minute and 9 seconds    Time 8    Period Weeks    Status New  Plan - 04/02/20 1119    Clinical Impression Statement Today's skilled session continued to focus on gait and strengthening. Pt continues to need significantly increased time with short distance gait. Rest breaks needed with ex's as well due to pt reported and noted fatigue (pt nodding of at times at edge of mat). Pt transported to lobby via wheelchair at end of session due to fatigue. The pt should benefit from continued PT to progress toward  unmet goals.    Personal Factors and Comorbidities Behavior Pattern;Comorbidity 3+;Time since onset of injury/illness/exacerbation;Past/Current Experience;Fitness    Comorbidities PMH: CVA (12/2018)-right thalamic stroke and left frontal punctate infarct, , diabetes mellitus, HTN, lupus, seizure, depression    Examination-Activity Limitations Bathing;Bed Mobility;Locomotion Level;Squat;Transfers;Stand;Stairs    Stability/Clinical Decision Making Evolving/Moderate complexity    Rehab Potential Fair    PT Frequency 2x / week    PT Duration 8 weeks    PT Treatment/Interventions ADLs/Self Care Home Management;DME Instruction;Stair training;Gait training;Functional mobility training;Therapeutic activities;Therapeutic exercise;Balance training;Neuromuscular re-education;Patient/family education;Passive range of motion;Vestibular    PT Next Visit Plan pt ambulates slowly (might save time to wheel pt back to therapy gym in wheelchair). continue to work on [place], begin to work on standing tolerance/balance with support progressing to decreased support. assess stairs (when able)    PT Home Exercise Plan Access Code: [email or web address]    Consulted and Agree with Plan of Care Family member/caregiver;Patient    Family Member Consulted wife, [name]           Patient will benefit from skilled therapeutic intervention in order to improve the following deficits and impairments:  Abnormal gait, Decreased balance, Decreased activity tolerance, Decreased coordination, Decreased cognition, Decreased endurance, Decreased knowledge of use of DME, Decreased range of motion, Decreased strength, Difficulty walking, Postural dysfunction  Visit Diagnosis: Unsteadiness on feet  Muscle weakness (generalized)  Other abnormalities of gait and mobility     Problem List Patient Active Problem List   Diagnosis Date Noted  . Acute ischemic right MCA stroke ([place]) 12/07/2018  . Stroke ([place]) 12/07/2018  . Moderate  dementia without behavioral disturbance () 07/27/2018  . New onset seizure ([place]) 07/27/2018  . Depression 12/16/2017  . Essential hypertension 03/26/2017  . Hyperlipidemia 03/26/2017  . Cardiac murmur 03/26/2017  . Mild cognitive impairment, so stated 05/01/2013  . Memory loss 05/01/2013    Willow Ora, PTA, [place] [address] [place], [place] [address] [phone] 04/03/20, 11:41 AM   Name: David Irwin MRN: [id number] Date of Birth: [birth year]

## 2020-04-04 ENCOUNTER — Encounter: Payer: Self-pay | Admitting: Physical Therapy

## 2020-04-04 ENCOUNTER — Other Ambulatory Visit: Payer: Self-pay

## 2020-04-04 ENCOUNTER — Ambulatory Visit: Payer: PPO | Attending: Family Medicine | Admitting: Physical Therapy

## 2020-04-04 VITALS — BP 135/68 | HR 73

## 2020-04-04 DIAGNOSIS — M6281 Muscle weakness (generalized): Secondary | ICD-10-CM | POA: Insufficient documentation

## 2020-04-04 DIAGNOSIS — R2681 Unsteadiness on feet: Secondary | ICD-10-CM | POA: Diagnosis not present

## 2020-04-04 DIAGNOSIS — R29818 Other symptoms and signs involving the nervous system: Secondary | ICD-10-CM | POA: Diagnosis not present

## 2020-04-04 DIAGNOSIS — R2689 Other abnormalities of gait and mobility: Secondary | ICD-10-CM | POA: Diagnosis not present

## 2020-04-04 NOTE — Therapy (Signed)
[facility] [address] [place], [place], [address] Phone: [phone]   Fax:  [phone]  Physical Therapy Treatment  Patient Details  Name: David Irwin MRN: [id number] Date of Birth: 08-31-1946 Referring Provider (PT): Debbora Presto, NP   Encounter Date: 04/04/2020   PT End of Session - 04/04/20 1327    Visit Number 4    Number of Visits 17    Date for PT Re-Evaluation 06/23/20   written for 60 day POC   Authorization Type [employer] PPO - $15 co pay    PT Start Time 1239   pt arrived late, therapist met pt in lobby, wheeled pt back to treatment room in clinic wheelchair   PT Stop Time 1319    PT Time Calculation (min) 40 min    Equipment Utilized During Treatment Gait belt    Activity Tolerance Patient tolerated treatment well;Patient limited by fatigue;Patient limited by lethargy    Behavior During Therapy [facility] for tasks assessed/performed;Flat affect   tired, pt closing his eyes at times          Past Medical History:  Diagnosis Date  . CVA (cerebral vascular accident) ([place]) 12/07/2018  . Diabetes mellitus without complication ([place])   . Hypertension   . Lupus ([place])   . Memory loss   . Murmur, cardiac   . Nocturia   . Seizure [facility])     Past Surgical History:  Procedure Laterality Date  . APPENDECTOMY    . EYE SURGERY      Vitals:   04/04/20 1305  BP: 135/68  Pulse: 73     Subjective Assessment - 04/04/20 1245    Subjective Feeling tired today. No falls. Has not been doing the exercises at home.    Patient is accompained by: Family member   spouse, [name]   Pertinent History CVA (12/2018)-right thalamic stroke and left frontal punctate infarct, , diabetes mellitus, HTN, lupus, seizure, depression    Patient Stated Goals "wants a normal life", wants to walk normal    Currently in Pain? No/denies                             [facility] Adult PT Treatment/Exercise - 04/04/20  0001      Transfers   Transfers Sit to Stand;Stand to Sit    Sit to Stand 4: Min guard;With upper extremity assist;From chair/3-in-1    Stand to Sit 4: Min guard;With upper extremity assist;To chair/3-in-1    Transfer Cueing performed stand step transfers with RW from w/c <> mat table and from w/c > car at end of session (therapist wheeled pt out to car), needing min A and max verbal cues for proper RW placement and for turning, cues for step length, manual assist needed to help with RW navigation for safe transfer     Comments 5 reps and then additional reps performed throughout session, pt needing cues for proper UE placement for safety with transfer as pt with tendency to go to perform with BUE support on RW. cues for upright posture once in standing       Exercises   Exercises Other Exercises    Other Exercises  Standing at RW: 5 step taps B to 2" step, cues for incr foot clearance and stepping back down to floor. Needed seated rest break afterwards. Standing with BUE support on chair anteriorly 5 lateral step taps, performing B, cues for posture and incr  foot clearance.             Access Code: [email or web address] URL: [email or web address] Date: 04/04/2020 Prepared by: Janann August   Reviewed bolded exercises below for HEP as pt had not been performing at home, wife present and verbalized understanding of each exercise and importance of performing daily at home.   Exercises Seated Heel Toe Raises - 1 x daily - 5 x weekly - 1 sets - 10 reps Seated Knee Extension with Resistance - 1 x daily - 5 x weekly - 1 sets - 10 reps Seated Knee Flexion with Anchored Resistance - 1 x daily - 5 x weekly - 1 sets - 10 reps Seated March with Resistance - 1 x daily - 5 x weekly - 1 sets - 10 reps - with use of green theraband  Seated Hip Abduction with Resistance - 1 x daily - 5 x weekly - 1 sets - 10 reps - with use of green theraband           PT Short Term Goals - 04/03/20 1156        PT SHORT TERM GOAL #1   Title Pt and wife will be independent with initial HEP in order to build upon functional gains made in therapy. ALL STGS DUE 04/22/20    Time 4    Period Weeks    Status New    Target Date 04/22/20      PT SHORT TERM GOAL #2   Title Pt will undergo further assessment of stairs - LTG written as appropriate.    Time 4    Period Weeks    Status New      PT SHORT TERM GOAL #3   Title Pt will decr TUG time to 1 minute and 40 seconds with RW in order to decr fall risk.    Baseline 1 minute and 55.9 seconds    Time 4    Period Weeks    Status Revised      PT SHORT TERM GOAL #4   Title Pt will improve gait speed to at least .6 ft/sec with RW in order to decr fall risk.    Baseline .39 ft/sec    Time 4    Period Weeks    Status Revised      PT SHORT TERM GOAL #5   Title Pt will decr 5x sit <> stand time to 55 seconds or less using BUE support from standard height chair in order to improve functional BLE strength.    Baseline 1 minute and 9 seconds    Time 4    Period Weeks    Status New             PT Long Term Goals - 04/03/20 1158      PT LONG TERM GOAL #1   Title Pt and wife will be independent with final HEP in order to build upon functional gains made in therapy. ALL STGS DUE 05/20/20    Time 8    Period Weeks    Status New      PT LONG TERM GOAL #2   Title Stair goal to be written as appropriate for pt to safely enter/exit home.    Time 8    Period Weeks    Status New      PT LONG TERM GOAL #3   Title Pt will decr TUG time to 1 minute and 20 seconds with RW in order to decr fall risk  Baseline 1 minute and 55.9 seconds    Time 8    Period Weeks    Status Revised      PT LONG TERM GOAL #4   Title Pt will improve gait speed to at least .8 ft/sec with RW in order to decr fall risk.with RW in order to determine fall risk -  LTG to be written as appropriate.    Baseline .39 ft/sec    Time 8    Period Weeks    Status Revised       PT LONG TERM GOAL #5   Title Pt will decr 5x sit <> stand time to 45 seconds or less using BUE support from standard height chair in order to improve functional BLE strength.    Baseline 1 minute and 9 seconds    Time 8    Period Weeks    Status New                 Plan - 04/04/20 1334    Clinical Impression Statement Today's skilled session focused on sit <> stands, functional transfers, and strengthening. Pt needing seated rest breaks between each activity due to fatigue. Pt nodding off at times and closing his eyes and reporting feeling tired. Wife reporting pt feeling tired due to his medication. Assessed pt's BP and was [facility]. Wheeled pt out to car at end of session and performed w/c > car transfer with min A with RW. Will continue to progress towards LTGs.    Personal Factors and Comorbidities Behavior Pattern;Comorbidity 3+;Time since onset of injury/illness/exacerbation;Past/Current Experience;Fitness    Comorbidities PMH: CVA (12/2018)-right thalamic stroke and left frontal punctate infarct, , diabetes mellitus, HTN, lupus, seizure, depression    Examination-Activity Limitations Bathing;Bed Mobility;Locomotion Level;Squat;Transfers;Stand;Stairs    Stability/Clinical Decision Making Evolving/Moderate complexity    Rehab Potential Fair    PT Frequency 2x / week    PT Duration 8 weeks    PT Treatment/Interventions ADLs/Self Care Home Management;DME Instruction;Stair training;Gait training;Functional mobility training;Therapeutic activities;Therapeutic exercise;Balance training;Neuromuscular re-education;Patient/family education;Passive range of motion;Vestibular    PT Next Visit Plan pt ambulates slowly (might save time to wheel pt back to therapy gym in wheelchair). continue to work on [place], begin to work on standing tolerance/balance with support progressing to decreased support. assess stairs (when able)    PT Home Exercise Plan Access Code: [email or web address]    Consulted and  Agree with Plan of Care Family member/caregiver;Patient    Family Member Consulted wife, [name]           Patient will benefit from skilled therapeutic intervention in order to improve the following deficits and impairments:  Abnormal gait, Decreased balance, Decreased activity tolerance, Decreased coordination, Decreased cognition, Decreased endurance, Decreased knowledge of use of DME, Decreased range of motion, Decreased strength, Difficulty walking, Postural dysfunction  Visit Diagnosis: Unsteadiness on feet  Muscle weakness (generalized)  Other abnormalities of gait and mobility  Other symptoms and signs involving the nervous system     Problem List Patient Active Problem List   Diagnosis Date Noted  . Acute ischemic right MCA stroke ([place]) 12/07/2018  . Stroke ([place]) 12/07/2018  . Moderate dementia without behavioral disturbance ([place]) 07/27/2018  . New onset seizure ([place]) 07/27/2018  . Depression 12/16/2017  . Essential hypertension 03/26/2017  . Hyperlipidemia 03/26/2017  . Cardiac murmur 03/26/2017  . Mild cognitive impairment, so stated 05/01/2013  . Memory loss 05/01/2013    Arliss Journey, PT, DPT  04/04/2020, 1:36 PM  [facility]  [place] [address] [place], [place], [address] Phone: [phone]   Fax:  [phone]  Name: David Irwin MRN: [id number] Date of Birth: December 11, 1945

## 2020-04-10 ENCOUNTER — Ambulatory Visit: Payer: PPO | Admitting: Physical Therapy

## 2020-04-12 ENCOUNTER — Encounter: Payer: Self-pay | Admitting: Physical Therapy

## 2020-04-12 ENCOUNTER — Other Ambulatory Visit: Payer: Self-pay

## 2020-04-12 ENCOUNTER — Ambulatory Visit: Payer: PPO | Admitting: Physical Therapy

## 2020-04-12 DIAGNOSIS — R2681 Unsteadiness on feet: Secondary | ICD-10-CM | POA: Diagnosis not present

## 2020-04-12 DIAGNOSIS — M6281 Muscle weakness (generalized): Secondary | ICD-10-CM

## 2020-04-12 DIAGNOSIS — R2689 Other abnormalities of gait and mobility: Secondary | ICD-10-CM

## 2020-04-12 NOTE — Therapy (Signed)
[facility] [address] [place], [place], [address] Phone: [phone]   Fax:  [phone]  Physical Therapy Treatment  Patient Details  Name: David Irwin MRN: [id number] Date of Birth: 12-27-1945 Referring Provider (PT): Shawnie Dapper, NP   Encounter Date: 04/12/2020   PT End of Session - 04/12/20 1038    Visit Number 5    Number of Visits 17    Date for PT Re-Evaluation 06/23/20   written for 60 day POC   Authorization Type [employer] PPO - $15 co pay    PT Start Time 1015    PT Stop Time 1058    PT Time Calculation (min) 43 min    Equipment Utilized During Treatment Gait belt    Activity Tolerance Patient tolerated treatment well;Patient limited by fatigue;Patient limited by lethargy    Behavior During Therapy [facility] for tasks assessed/performed;Flat affect   tired, pt closing his eyes at times          Past Medical History:  Diagnosis Date  . CVA (cerebral vascular accident) ([facility]) 12/07/2018  . Diabetes mellitus without complication ([facility])   . Hypertension   . Lupus ([facility])   . Memory loss   . Murmur, cardiac   . Nocturia   . Seizure [facility])     Past Surgical History:  Procedure Laterality Date  . APPENDECTOMY    . EYE SURGERY      There were no vitals filed for this visit.   Subjective Assessment - 04/12/20 1021    Subjective No reports of pain. No new falls and no changes since last visit.    Patient is accompained by: Family member   spouse, [name]   Pertinent History CVA (12/2018)-right thalamic stroke and left frontal punctate infarct, , diabetes mellitus, HTN, lupus, seizure, depression    Patient Stated Goals "wants a normal life", wants to walk normal    Currently in Pain? No/denies    Multiple Pain Sites No              [facility] Adult PT Treatment/Exercise - 04/12/20 1026      Transfers   Transfers Sit to Stand;Stand to [employer] Transfers    Sit to Stand 4: Min guard    Sit  to Stand Details Verbal cues for sequencing;Verbal cues for technique;Verbal cues for precautions/safety;Verbal cues for safe use of DME/AE    Stand to Sit 4: Min guard    Stand to Sit Details (indicate cue type and reason) Verbal cues for sequencing;Verbal cues for technique;Verbal cues for precautions/safety;Verbal cues for safe use of DME/AE    Stand Pivot Transfers 4: Min assist    Stand Pivot Transfer Details (indicate cue type and reason) wheelchair to/from mat table    Transfer Cueing Performed standing pivot transfer from [facility] <> bed; VC's needed for pt. to keep walker closer to BOS as well as to reach back for surface    Comments 5 reps; VC's needed for hand placement      Exercises   Exercises Knee/Hip      Knee/Hip Exercises: Standing   Other Standing Knee Exercises pt. performed toe taps on 2 in. step while standing in RW; 2 sets x 10 reps bil.; rest break needed between sets. Pt. stated he was "done" when resting after first set      Knee/Hip Exercises: Seated   Long Arc Quad 2 sets;10 reps;Strengthening;Both;Weights    Long Arc Quad Weight 2 lbs.    Long  Arc Quad Limitations tactile cues provided for target; min assist provided on L to reach terminal knee extension    Other Seated Knee/Hip Exercises Pt. performed seated hip abduction with red theraband for added resistance; 10 reps x 2 sets bil.; verbal and tactile cues needed for technique    Marching AROM;Strengthening;Both;2 sets;20 reps   red theraband   Marching Limitations VC's needed for pt. to perform alternating march    Hamstring Curl Strengthening;AROM;Both;2 sets;10 reps;Limitations    Hamstring Limitations red theraband and pillowcase to help feet slide               PT Short Term Goals - 04/03/20 1156      PT SHORT TERM GOAL #1   Title Pt and wife David be independent with initial HEP in order to build upon functional gains made in therapy. ALL STGS DUE 04/22/20    Time 4    Period Weeks    Status New     Target Date 04/22/20      PT SHORT TERM GOAL #2   Title Pt David undergo further assessment of stairs - LTG written as appropriate.    Time 4    Period Weeks    Status New      PT SHORT TERM GOAL #3   Title Pt David decr TUG time to 1 minute and 40 seconds with RW in order to decr fall risk.    Baseline 1 minute and 55.9 seconds    Time 4    Period Weeks    Status Revised      PT SHORT TERM GOAL #4   Title Pt David improve gait speed to at least .6 ft/sec with RW in order to decr fall risk.    Baseline .39 ft/sec    Time 4    Period Weeks    Status Revised      PT SHORT TERM GOAL #5   Title Pt David decr 5x sit <> stand time to 55 seconds or less using BUE support from standard height chair in order to improve functional BLE strength.    Baseline 1 minute and 9 seconds    Time 4    Period Weeks    Status New             PT Long Term Goals - 04/03/20 1158      PT LONG TERM GOAL #1   Title Pt and wife David be independent with final HEP in order to build upon functional gains made in therapy. ALL STGS DUE 05/20/20    Time 8    Period Weeks    Status New      PT LONG TERM GOAL #2   Title Stair goal to be written as appropriate for pt to safely enter/exit home.    Time 8    Period Weeks    Status New      PT LONG TERM GOAL #3   Title Pt David decr TUG time to 1 minute and 20 seconds with RW in order to decr fall risk    Baseline 1 minute and 55.9 seconds    Time 8    Period Weeks    Status Revised      PT LONG TERM GOAL #4   Title Pt David improve gait speed to at least .8 ft/sec with RW in order to decr fall risk.with RW in order to determine fall risk -  LTG to be written as appropriate.  Baseline .39 ft/sec    Time 8    Period Weeks    Status Revised      PT LONG TERM GOAL #5   Title Pt David decr 5x sit <> stand time to 45 seconds or less using BUE support from standard height chair in order to improve functional BLE strength.    Baseline 1 minute and 9  seconds    Time 8    Period Weeks    Status New              Plan - 04/12/20 1059    Clinical Impression Statement Todays therapy session placed emphasis on seated and standing strengthening exercises of bil. LE's. Pt. was limited by fatigue, requiring several rest breaks between sets. Pt. continues to close his eyes and nod off during session. Transferring form WC <> bed, pt. required cues for safety such as keeping the walker close to his body. Pt. David benefit from further PT in order to increase functional strength and mobility.    Personal Factors and Comorbidities Behavior Pattern;Comorbidity 3+;Time since onset of injury/illness/exacerbation;Past/Current Experience;Fitness    Comorbidities PMH: CVA (12/2018)-right thalamic stroke and left frontal punctate infarct, , diabetes mellitus, HTN, lupus, seizure, depression    Examination-Activity Limitations Bathing;Bed Mobility;Locomotion Level;Squat;Transfers;Stand;Stairs    Stability/Clinical Decision Making Evolving/Moderate complexity    Rehab Potential Fair    PT Frequency 2x / week    PT Duration 8 weeks    PT Treatment/Interventions ADLs/Self Care Home Management;DME Instruction;Stair training;Gait training;Functional mobility training;Therapeutic activities;Therapeutic exercise;Balance training;Neuromuscular re-education;Patient/family education;Passive range of motion;Vestibular    PT Next Visit Plan Continue with LE strengthening and progressing pt. through standing activities.    PT Home Exercise Plan Access Code: [email or web address]    Consulted and Agree with Plan of Care Family member/caregiver;Patient    Family Member Consulted wife, [name]           Patient David benefit from skilled therapeutic intervention in order to improve the following deficits and impairments:  Abnormal gait, Decreased balance, Decreased activity tolerance, Decreased coordination, Decreased cognition, Decreased endurance, Decreased knowledge of use of DME,  Decreased range of motion, Decreased strength, Difficulty walking, Postural dysfunction  Visit Diagnosis: Unsteadiness on feet  Muscle weakness (generalized)  Other abnormalities of gait and mobility     Problem List Patient Active Problem List   Diagnosis Date Noted  . Acute ischemic right MCA stroke ([facility]) 12/07/2018  . Stroke ([facility]) 12/07/2018  . Moderate dementia without behavioral disturbance ([facility]) 07/27/2018  . New onset seizure ([facility]) 07/27/2018  . Depression 12/16/2017  . Essential hypertension 03/26/2017  . Hyperlipidemia 03/26/2017  . Cardiac murmur 03/26/2017  . Mild cognitive impairment, so stated 05/01/2013  . Memory loss 05/01/2013    Rosana Berger, SPTA 04/12/2020, 11:19 AM  [facility] [address] [place], [place], [address] Phone: [phone]   Fax:  [phone]  Name: Laquincy Eastridge MRN: [id number] Date of Birth: October 25, 1945  This note has been reviewed and edited by supervising CI.  Sallyanne Kuster, PTA, [facility] [address], [address] [place], [place] [address] [phone] 04/12/20, 2:04 PM

## 2020-04-15 ENCOUNTER — Ambulatory Visit: Payer: PPO | Admitting: Physical Therapy

## 2020-04-17 ENCOUNTER — Encounter: Payer: Self-pay | Admitting: Physical Therapy

## 2020-04-17 ENCOUNTER — Ambulatory Visit: Payer: PPO | Admitting: Physical Therapy

## 2020-04-17 ENCOUNTER — Other Ambulatory Visit: Payer: Self-pay

## 2020-04-17 DIAGNOSIS — M6281 Muscle weakness (generalized): Secondary | ICD-10-CM

## 2020-04-17 DIAGNOSIS — R29818 Other symptoms and signs involving the nervous system: Secondary | ICD-10-CM

## 2020-04-17 DIAGNOSIS — R2689 Other abnormalities of gait and mobility: Secondary | ICD-10-CM

## 2020-04-17 DIAGNOSIS — R2681 Unsteadiness on feet: Secondary | ICD-10-CM

## 2020-04-17 NOTE — Therapy (Signed)
[facility] [address] [place], [place], [address] Phone: [phone]   Fax:  [phone]  Physical Therapy Treatment  Patient Details  Name: David Irwin MRN: [id number] Date of Birth: [birth year] Referring Provider (PT): Debbora Presto, NP   Encounter Date: 04/17/2020   PT End of Session - 04/17/20 1325    Visit Number 6    Number of Visits 17    Date for PT Re-Evaluation 06/23/20   written for 60 day POC   Authorization Type [employer] PPO - $15 co pay    PT Start Time 4193    PT Stop Time 1320    PT Time Calculation (min) 45 min    Equipment Utilized During Treatment Gait belt    Activity Tolerance Patient tolerated treatment well;Patient limited by fatigue    Behavior During Therapy [facility] for tasks assessed/performed;Flat affect           Past Medical History:  Diagnosis Date  . CVA (cerebral vascular accident) ([place]) 12/07/2018  . Diabetes mellitus without complication ([place])   . Hypertension   . Lupus ([place])   . Memory loss   . Murmur, cardiac   . Nocturia   . Seizure [facility])     Past Surgical History:  Procedure Laterality Date  . APPENDECTOMY    . EYE SURGERY      There were no vitals filed for this visit.   Subjective Assessment - 04/17/20 1241    Subjective Doing his exercises more often, but not everyday. No falls.    Patient is accompained by: Family member   spouse, [name]   Pertinent History CVA (12/2018)-right thalamic stroke and left frontal punctate infarct, , diabetes mellitus, HTN, lupus, seizure, depression    Patient Stated Goals "wants a normal life", wants to walk normal    Currently in Pain? No/denies              [facility] PT Assessment - 04/17/20 1247      Timed Up and Go Test   Normal TUG (seconds) --   1 minute and 33 seconds   TUG Comments with RW                         [facility] Adult PT Treatment/Exercise - 04/17/20 1247      Transfers   Transfers  Stand to Sit;Sit to Stand    Sit to Stand 4: Min guard    Sit to Stand Details Verbal cues for sequencing;Verbal cues for technique;Verbal cues for precautions/safety;Verbal cues for safe use of DME/AE    Five time sit to stand comments  34.71 seconds with BUE support from standard chair    Stand to Sit 4: Min guard    Stand to Sit Details (indicate cue type and reason) Verbal cues for sequencing;Verbal cues for technique;Verbal cues for precautions/safety;Verbal cues for safe use of DME/AE    Stand Pivot Transfers 4: Min assist    Stand Pivot Transfer Details (indicate cue type and reason) throughout session from w/c <> mat table and chair and at end of bout of gait to mat table, pt needing min A for RW steering and navigation for proper turn, cues for larger steps when turning and manual cues to back up with RW and feel mat posteriorly with BLE before sitting down    Transfer Cueing performed car transfer with RW, with pt needing min A for balance and RW navigation and needing cues for  safety as pt trying to perform without support of RW, pt and spouse will benefit from further training at future session.       Ambulation/Gait   Ambulation/Gait Yes    Ambulation/Gait Assistance 4: Min guard;4: Min assist    Ambulation/Gait Assistance Details w/c follow for safety, pt with tendency to walk more towards L side of RW, needing min A for steering of RW and keeping closer to body as pt tends to ambulate with incr forward flexed posture and pushing RW too far anteriorly. cues for incr step length B, w/c follow for safety, pt needing a prolonged seated rest break between each bout due to fatigue. ambulated additional 50' at end of session with pt from manual w/c > car over unlevel paved surfaces to perform car transfer     Ambulation Distance (Feet) 90 Feet   x1, 25' x 1   Assistive device Rolling walker    Gait Pattern Step-through pattern;Step-to pattern;Decreased stride length;Decreased stance time -  left;Decreased weight shift to left;Right foot flat;Left foot flat;Shuffle;Trunk flexed    Ambulation Surface Level;Indoor    Gait Comments discussed with pt and pt's wife about using of RW at all times when at home for stability/decr fall risk, pt's wife reporting that he sometimes walks with no AD or uses rollator. instructed about not using rollator at this time due to pt demonstrating incr forward lean with RW and balance during gait. pt wheeled back from waiting room in manual w/c (clinic w/c) and wheeled back out towards breezeway at end of session in [place] w/c    have not yet tried using rollator in session yet                   PT Short Term Goals - 04/17/20 1247      PT SHORT TERM GOAL #1   Title Pt and wife will be independent with initial HEP in order to build upon functional gains made in therapy. ALL STGS DUE 04/22/20    Baseline reports performing HEP more consistently, however not everyday.    Time 4    Period Weeks    Status Partially Met    Target Date 04/22/20      PT SHORT TERM GOAL #2   Title Pt will undergo further assessment of stairs - LTG written as appropriate.    Time 4    Period Weeks    Status New      PT SHORT TERM GOAL #3   Title Pt will decr TUG time to 1 minute and 40 seconds with RW in order to decr fall risk.    Baseline 1 minute and 55.9 seconds, 1 minute and 33 seconds on 04/17/20    Time 4    Period Weeks    Status Achieved      PT SHORT TERM GOAL #4   Title Pt will improve gait speed to at least .6 ft/sec with RW in order to decr fall risk.    Baseline .39 ft/sec    Time 4    Period Weeks    Status Revised      PT SHORT TERM GOAL #5   Title Pt will decr 5x sit <> stand time to 55 seconds or less using BUE support from standard height chair in order to improve functional BLE strength.    Baseline 1 minute and 9 seconds, 34.71 seconds on 04/17/20    Time 4    Period Weeks    Status Achieved  PT Long Term Goals -  04/03/20 1158      PT LONG TERM GOAL #1   Title Pt and wife will be independent with final HEP in order to build upon functional gains made in therapy. ALL STGS DUE 05/20/20    Time 8    Period Weeks    Status New      PT LONG TERM GOAL #2   Title Stair goal to be written as appropriate for pt to safely enter/exit home.    Time 8    Period Weeks    Status New      PT LONG TERM GOAL #3   Title Pt will decr TUG time to 1 minute and 20 seconds with RW in order to decr fall risk    Baseline 1 minute and 55.9 seconds    Time 8    Period Weeks    Status Revised      PT LONG TERM GOAL #4   Title Pt will improve gait speed to at least .8 ft/sec with RW in order to decr fall risk.with RW in order to determine fall risk -  LTG to be written as appropriate.    Baseline .39 ft/sec    Time 8    Period Weeks    Status Revised      PT LONG TERM GOAL #5   Title Pt will decr 5x sit <> stand time to 45 seconds or less using BUE support from standard height chair in order to improve functional BLE strength.    Baseline 1 minute and 9 seconds    Time 8    Period Weeks    Status New                 Plan - 04/17/20 1327    Clinical Impression Statement Focus of today's skilled session was beginning to assess STGs. Pt met STG  #3 and #5. Pt with improvement of TUG time today to 1 minute and 33 seconds (previously 1 minute and 55.9 seconds), however pt still at a significant risk for falls with RW. Pt improved 5x sit <> stand with BUE support to 34.71 seconds (previously 1 minute and 9 seconds). Remainder of session focused on functional transfers and gait training. Pt able to improve gait speed distance today to 48' with RW before needing a rest break. Pt needing min A for RW steering as pt with tendency to walk too far towards the L and pushing RW too far anteriorly. Discussed with pt and pt's spouse about importance of using of RW at all times for safety during gait (pt's spouse reports he  either uses no AD or sometimes a rollator at home), wife verbalized understanding. Pt much more awake during session today. Will continue to progress towards LTGs.    Personal Factors and Comorbidities Behavior Pattern;Comorbidity 3+;Time since onset of injury/illness/exacerbation;Past/Current Experience;Fitness    Comorbidities PMH: CVA (12/2018)-right thalamic stroke and left frontal punctate infarct, , diabetes mellitus, HTN, lupus, seizure, depression    Examination-Activity Limitations Bathing;Bed Mobility;Locomotion Level;Squat;Transfers;Stand;Stairs    Stability/Clinical Decision Making Evolving/Moderate complexity    Rehab Potential Fair    PT Frequency 2x / week    PT Duration 8 weeks    PT Treatment/Interventions ADLs/Self Care Home Management;DME Instruction;Stair training;Gait training;Functional mobility training;Therapeutic activities;Therapeutic exercise;Balance training;Neuromuscular re-education;Patient/family education;Passive range of motion;Vestibular    PT Next Visit Plan **pt is wheeled back into session with clinic w/c and also wheeled out due to pt with slow gait  speed and fatigue and for time constraints. needs to practice car transfers with RW and with spouse present for incr safety. assess remaining STGs as able . gait training with RW, sit <> stand transfers with RW.    PT Home Exercise Plan Access Code: [email or web address]    Consulted and Agree with Plan of Care Family member/caregiver;Patient    Family Member Consulted wife, [name]           Patient will benefit from skilled therapeutic intervention in order to improve the following deficits and impairments:  Abnormal gait, Decreased balance, Decreased activity tolerance, Decreased coordination, Decreased cognition, Decreased endurance, Decreased knowledge of use of DME, Decreased range of motion, Decreased strength, Difficulty walking, Postural dysfunction  Visit Diagnosis: Unsteadiness on feet  Muscle weakness  (generalized)  Other abnormalities of gait and mobility  Other symptoms and signs involving the nervous system     Problem List Patient Active Problem List   Diagnosis Date Noted  . Acute ischemic right MCA stroke ([place]) 12/07/2018  . Stroke ([place]) 12/07/2018  . Moderate dementia without behavioral disturbance ([place]) 07/27/2018  . New onset seizure ([place]) 07/27/2018  . Depression 12/16/2017  . Essential hypertension 03/26/2017  . Hyperlipidemia 03/26/2017  . Cardiac murmur 03/26/2017  . Mild cognitive impairment, so stated 05/01/2013  . Memory loss 05/01/2013    Arliss Journey, PT, DPT  04/17/2020, 1:31 PM  [facility] [address] [place], [place], [address] Phone: [phone]   Fax:  [phone]  Name: David Irwin MRN: [id number] Date of Birth: 09/11/1946

## 2020-04-19 ENCOUNTER — Ambulatory Visit: Payer: PPO | Admitting: Physical Therapy

## 2020-04-19 DIAGNOSIS — I10 Essential (primary) hypertension: Secondary | ICD-10-CM | POA: Diagnosis not present

## 2020-04-19 DIAGNOSIS — I639 Cerebral infarction, unspecified: Secondary | ICD-10-CM | POA: Diagnosis not present

## 2020-04-19 DIAGNOSIS — N183 Chronic kidney disease, stage 3 unspecified: Secondary | ICD-10-CM | POA: Diagnosis not present

## 2020-04-19 DIAGNOSIS — F0390 Unspecified dementia without behavioral disturbance: Secondary | ICD-10-CM | POA: Diagnosis not present

## 2020-04-19 DIAGNOSIS — F329 Major depressive disorder, single episode, unspecified: Secondary | ICD-10-CM | POA: Diagnosis not present

## 2020-04-19 DIAGNOSIS — D649 Anemia, unspecified: Secondary | ICD-10-CM | POA: Diagnosis not present

## 2020-04-19 DIAGNOSIS — E7800 Pure hypercholesterolemia, unspecified: Secondary | ICD-10-CM | POA: Diagnosis not present

## 2020-04-19 DIAGNOSIS — E1169 Type 2 diabetes mellitus with other specified complication: Secondary | ICD-10-CM | POA: Diagnosis not present

## 2020-04-23 ENCOUNTER — Other Ambulatory Visit: Payer: Self-pay

## 2020-04-23 ENCOUNTER — Ambulatory Visit: Payer: PPO | Admitting: Physical Therapy

## 2020-04-23 DIAGNOSIS — R2689 Other abnormalities of gait and mobility: Secondary | ICD-10-CM | POA: Diagnosis not present

## 2020-04-23 DIAGNOSIS — M7672 Peroneal tendinitis, left leg: Secondary | ICD-10-CM | POA: Diagnosis not present

## 2020-04-23 DIAGNOSIS — B351 Tinea unguium: Secondary | ICD-10-CM | POA: Diagnosis not present

## 2020-04-23 DIAGNOSIS — M6281 Muscle weakness (generalized): Secondary | ICD-10-CM

## 2020-04-23 DIAGNOSIS — M792 Neuralgia and neuritis, unspecified: Secondary | ICD-10-CM | POA: Diagnosis not present

## 2020-04-23 DIAGNOSIS — R2681 Unsteadiness on feet: Secondary | ICD-10-CM

## 2020-04-23 DIAGNOSIS — I8393 Asymptomatic varicose veins of bilateral lower extremities: Secondary | ICD-10-CM | POA: Diagnosis not present

## 2020-04-23 NOTE — Patient Instructions (Signed)
Toe Curl: Unilateral    With left foot resting on towel, slowly bunch up towel by curling toes. Repeat _10___times per set. Do _2___ sessions per day.  http://orth.exer.us/18   Copyright  VHI. All rights reserved.  Ankle Plantar Flexion: Long-Sitting (Single Leg)    Loop tubing around foot of straight leg, anchor with one hand. Leg straight, point toes downward. Repeat 10__ times per set. Repeat with other leg. Do _2 times a day.  http://tub.exer.us/215   Copyright  VHI. All rights reserved.  ROM: Inversion / Eversion    With left leg relaxed, gently turn ankle and foot in and out. Move through full range of motion. Avoid pain. Repeat _10___ times per set. Do __2__ sessions per day.  http://orth.exer.us/36   Copyright  VHI. All rights reserved.  ANKLE: Dorsiflexion (Band)    Sit at edge of surface. Place band around top of foot. Keeping heel on floor, raise toes of banded foot. Hold _2__ seconds. Use __yellow______ band. 10___ reps, _2__ sets per day  Copyright  VHI. All rights reserved.

## 2020-04-23 NOTE — Therapy (Signed)
[facility] [address] [place], [place], [address] Phone: [phone]   Fax:  [phone]  Physical Therapy Treatment  Patient Details  Name: David Irwin MRN: [id number] Date of Birth: [birth year] Referring Provider (PT): Debbora Presto, NP   Encounter Date: 04/23/2020   PT End of Session - 04/23/20 1503    Visit Number 7    Number of Visits 17    Date for PT Re-Evaluation 06/23/20   written for 60 day POC   Authorization Type [employer] PPO - $15 co pay    PT Start Time 1317    PT Stop Time 1405    PT Time Calculation (min) 48 min    Equipment Utilized During Treatment Gait belt    Activity Tolerance Patient tolerated treatment well;Patient limited by fatigue    Behavior During Therapy [facility] for tasks assessed/performed;Flat affect           Past Medical History:  Diagnosis Date  . CVA (cerebral vascular accident) ([place]) 12/07/2018  . Diabetes mellitus without complication ([place])   . Hypertension   . Lupus ([place])   . Memory loss   . Murmur, cardiac   . Nocturia   . Seizure [facility])     Past Surgical History:  Procedure Laterality Date  . APPENDECTOMY    . EYE SURGERY      There were no vitals filed for this visit.   Subjective Assessment - 04/23/20 1333    Subjective Denies any falls or changes.    Currently in Pain? Yes    Pain Score --   doesnt rate but says "I'm still moving so it must not be too bad"   Pain Location Leg    Pain Orientation Left                             [facility] Adult PT Treatment/Exercise - 04/23/20 0001      Transfers   Transfers Stand to Sit;Sit to Stand    Sit to Stand 4: Min guard    Sit to Stand Details Verbal cues for sequencing;Verbal cues for technique;Verbal cues for precautions/safety;Verbal cues for safe use of DME/AE    Stand to Sit 4: Min guard    Stand to Sit Details (indicate cue type and reason) Verbal cues for sequencing;Verbal cues  for technique;Verbal cues for precautions/safety;Verbal cues for safe use of DME/AE    Stand Pivot Transfers 4: Min assist    Number of Reps Other sets (comment)   multiple times during session     Ambulation/Gait   Ambulation/Gait Yes    Ambulation/Gait Assistance 4: Min assist    Ambulation/Gait Assistance Details decreased cadence, decreased step length on L and could not improve despite constant cues.  Pushes walker too far forward.  Shuffles feet.    Ambulation Distance (Feet) 70 Feet   x 2   Assistive device Rolling walker    Gait Pattern Step-through pattern;Step-to pattern;Decreased stride length;Decreased stance time - left;Decreased weight shift to left;Right foot flat;Left foot flat;Shuffle;Trunk flexed    Ambulation Surface Level;Indoor      Posture/Postural Control   Posture/Postural Control Postural limitations    Postural Limitations Forward head;Rounded Shoulders      Exercises   Exercises Knee/Hip;Ankle      Knee/Hip Exercises: Seated   Long Arc Quad 2 sets;10 reps;Strengthening;Both    Marching Left;Strengthening;10 reps      Ankle Exercises: Seated  Ankle Circles/Pumps Left;10 reps    Towel Crunch 3 reps    Towel Inversion/Eversion Other (comment)   10 reps with yellow theraband   Toe Raise 10 reps;Other (comment)   yellow band   Other Seated Ankle Exercises plantar flexion with yellow band x 10 reps in LAQ position                  PT Education - 04/23/20 1502    Education Details additions to HEP    Person(s) Educated Patient;Spouse    Methods Explanation;Demonstration;Handout;Verbal cues    Comprehension Verbalized understanding;Need further instruction            PT Short Term Goals - 04/17/20 1247      PT SHORT TERM GOAL #1   Title Pt and wife will be independent with initial HEP in order to build upon functional gains made in therapy. ALL STGS DUE 04/22/20    Baseline reports performing HEP more consistently, however not everyday.     Time 4    Period Weeks    Status Partially Met    Target Date 04/22/20      PT SHORT TERM GOAL #2   Title Pt will undergo further assessment of stairs - LTG written as appropriate.    Time 4    Period Weeks    Status New      PT SHORT TERM GOAL #3   Title Pt will decr TUG time to 1 minute and 40 seconds with RW in order to decr fall risk.    Baseline 1 minute and 55.9 seconds, 1 minute and 33 seconds on 04/17/20    Time 4    Period Weeks    Status Achieved      PT SHORT TERM GOAL #4   Title Pt will improve gait speed to at least .6 ft/sec with RW in order to decr fall risk.    Baseline .39 ft/sec    Time 4    Period Weeks    Status Revised      PT SHORT TERM GOAL #5   Title Pt will decr 5x sit <> stand time to 55 seconds or less using BUE support from standard height chair in order to improve functional BLE strength.    Baseline 1 minute and 9 seconds, 34.71 seconds on 04/17/20    Time 4    Period Weeks    Status Achieved             PT Long Term Goals - 04/03/20 1158      PT LONG TERM GOAL #1   Title Pt and wife will be independent with final HEP in order to build upon functional gains made in therapy. ALL STGS DUE 05/20/20    Time 8    Period Weeks    Status New      PT LONG TERM GOAL #2   Title Stair goal to be written as appropriate for pt to safely enter/exit home.    Time 8    Period Weeks    Status New      PT LONG TERM GOAL #3   Title Pt will decr TUG time to 1 minute and 20 seconds with RW in order to decr fall risk    Baseline 1 minute and 55.9 seconds    Time 8    Period Weeks    Status Revised      PT LONG TERM GOAL #4   Title Pt will improve gait speed  to at least .8 ft/sec with RW in order to decr fall risk.with RW in order to determine fall risk -  LTG to be written as appropriate.    Baseline .39 ft/sec    Time 8    Period Weeks    Status Revised      PT LONG TERM GOAL #5   Title Pt will decr 5x sit <> stand time to 45 seconds or less  using BUE support from standard height chair in order to improve functional BLE strength.    Baseline 1 minute and 9 seconds    Time 8    Period Weeks    Status New                 Plan - 04/23/20 1503    Clinical Impression Statement Session focused on addressing gait deviations/technique along with exercises for L foot/ankle as pt reports having discomfort at times to Podiatrist who recommended pt to address with exercise.  Pt needs encouragement to participate through out session but was alert during session.  Cont PT per POC.    Personal Factors and Comorbidities Behavior Pattern;Comorbidity 3+;Time since onset of injury/illness/exacerbation;Past/Current Experience;Fitness    Comorbidities PMH: CVA (12/2018)-right thalamic stroke and left frontal punctate infarct, , diabetes mellitus, HTN, lupus, seizure, depression    Examination-Activity Limitations Bathing;Bed Mobility;Locomotion Level;Squat;Transfers;Stand;Stairs    Examination-Participation Restrictions School    Stability/Clinical Decision Making Evolving/Moderate complexity    Rehab Potential Fair    PT Frequency 2x / week    PT Duration 8 weeks    PT Treatment/Interventions ADLs/Self Care Home Management;DME Instruction;Stair training;Gait training;Functional mobility training;Therapeutic activities;Therapeutic exercise;Balance training;Neuromuscular re-education;Patient/family education;Passive range of motion;Vestibular    PT Next Visit Plan **pt is wheeled back into session with clinic w/c and also wheeled out due to pt with slow gait speed and fatigue and for time constraints. needs to practice car transfers with RW and with spouse present for incr safety. assess remaining STGs as able . gait training with RW, sit <> stand transfers with RW.    PT Home Exercise Plan Access Code: [email or web address]    Consulted and Agree with Plan of Care Family member/caregiver;Patient    Family Member Consulted wife, [name]           Patient  will benefit from skilled therapeutic intervention in order to improve the following deficits and impairments:  Abnormal gait, Decreased balance, Decreased activity tolerance, Decreased coordination, Decreased cognition, Decreased endurance, Decreased knowledge of use of DME, Decreased range of motion, Decreased strength, Difficulty walking, Postural dysfunction  Visit Diagnosis: Unsteadiness on feet  Muscle weakness (generalized)  Other abnormalities of gait and mobility     Problem List Patient Active Problem List   Diagnosis Date Noted  . Acute ischemic right MCA stroke ([place]) 12/07/2018  . Stroke ([place]) 12/07/2018  . Moderate dementia without behavioral disturbance ([place]) 07/27/2018  . New onset seizure ([place]) 07/27/2018  . Depression 12/16/2017  . Essential hypertension 03/26/2017  . Hyperlipidemia 03/26/2017  . Cardiac murmur 03/26/2017  . Mild cognitive impairment, so stated 05/01/2013  . Memory loss 05/01/2013    Narda Bonds, PTA [facility] 04/23/20 3:12 PM Phone: [phone] Fax: [facility] [address] [place], [place], [address] Phone: [phone]   Fax:  [phone]  Name: Mary Hockey MRN: [id number] Date of Birth: 1945-10-19

## 2020-04-26 ENCOUNTER — Ambulatory Visit: Payer: PPO | Admitting: Physical Therapy

## 2020-04-29 ENCOUNTER — Ambulatory Visit: Payer: PPO | Admitting: Physical Therapy

## 2020-04-29 ENCOUNTER — Other Ambulatory Visit: Payer: Self-pay

## 2020-04-29 DIAGNOSIS — R2681 Unsteadiness on feet: Secondary | ICD-10-CM

## 2020-04-29 DIAGNOSIS — R2689 Other abnormalities of gait and mobility: Secondary | ICD-10-CM

## 2020-04-29 DIAGNOSIS — M6281 Muscle weakness (generalized): Secondary | ICD-10-CM

## 2020-04-29 DIAGNOSIS — R29818 Other symptoms and signs involving the nervous system: Secondary | ICD-10-CM

## 2020-04-29 NOTE — Therapy (Signed)
[facility] [address] [place], [place], [address] Phone: [phone]   Fax:  [phone]  Physical Therapy Treatment  Patient Details  Name: David Irwin MRN: [id number] Date of Birth: 1946/03/01 Referring Provider (PT): Debbora Presto, NP   Encounter Date: 04/29/2020   PT End of Session - 04/29/20 1204    Visit Number 8    Number of Visits 17    Date for PT Re-Evaluation 06/23/20   written for 60 day POC   Authorization Type [employer] PPO - $15 co pay    PT Start Time 1017    PT Stop Time 1100    PT Time Calculation (min) 43 min    Equipment Utilized During Treatment Gait belt    Activity Tolerance Patient tolerated treatment well;Patient limited by fatigue    Behavior During Therapy [facility] for tasks assessed/performed;Flat affect           Past Medical History:  Diagnosis Date  . CVA (cerebral vascular accident) ([place]) 12/07/2018  . Diabetes mellitus without complication ([place])   . Hypertension   . Lupus ([place])   . Memory loss   . Murmur, cardiac   . Nocturia   . Seizure [facility])     Past Surgical History:  Procedure Laterality Date  . APPENDECTOMY    . EYE SURGERY      There were no vitals filed for this visit.   Subjective Assessment - 04/29/20 1022    Subjective reports he feels like he gotten slower. Has not felt like doing the exercises at home.    Pertinent History CVA (12/2018)-right thalamic stroke and left frontal punctate infarct, , diabetes mellitus, HTN, lupus, seizure, depression    Patient Stated Goals "wants a normal life", wants to walk normal    Currently in Pain? No/denies                             [facility] Adult PT Treatment/Exercise - 04/29/20 1047      Transfers   Transfers Stand to Sit;Sit to Stand    Sit to Stand 4: Min guard    Sit to Stand Details Verbal cues for sequencing;Verbal cues for technique;Verbal cues for precautions/safety;Verbal cues for  safe use of DME/AE    Sit to Stand Details (indicate cue type and reason) performed x5 reps of sit <> stands from mat table with use of one UE pushing from mat table and one on RW, needing reminder cues before each bout on proper UE placement, then performed an additional x5 reps with BUE support from mat table and standing tall without UE support before reaching behind to sit down with eccentric control, min guard.     Stand to Sit 4: Min guard    Stand to Sit Details (indicate cue type and reason) Verbal cues for sequencing;Verbal cues for technique;Verbal cues for precautions/safety;Verbal cues for safe use of DME/AE    Stand Pivot Transfers 4: Min assist    Stand Pivot Transfer Details (indicate cue type and reason) needs assist for steering when turning with RW    Comments at edge of session performed car transfer with pt and RW as pt currently performs using no AD, needing assist and cues while turning, pt got stuck and couldn't back up to sit down in higher van, instead pt turned around to face car and get in by holding rails with min A from therapist for safety. will continue  to practice for safest technique to get into car. pt taking incr time      Ambulation/Gait   Ambulation/Gait Yes    Ambulation/Gait Assistance 4: Min assist    Ambulation/Gait Assistance Details pt with decr step length with LLE, verbal cues for incr stride length and use of theraband on RW anteriorly as visual cues to step towards the colors, pt initially able to perform larger step lengths for a couple steps, but then was inconsistent despite frequent cues. pt continues to push the RW too far anteriorly and needs assist at times from therapist for steering due to pt having tendency to push too far to the L. wheelchair follow for safety    Ambulation Distance (Feet) 61 Feet    Assistive device Rolling walker    Gait Pattern Step-through pattern;Step-to pattern;Decreased stride length;Decreased stance time - left;Decreased  weight shift to left;Right foot flat;Left foot flat;Shuffle;Trunk flexed    Ambulation Surface Level;Indoor                    PT Short Term Goals - 04/17/20 1247      PT SHORT TERM GOAL #1   Title Pt and wife will be independent with initial HEP in order to build upon functional gains made in therapy. ALL STGS DUE 04/22/20    Baseline reports performing HEP more consistently, however not everyday.    Time 4    Period Weeks    Status Partially Met    Target Date 04/22/20      PT SHORT TERM GOAL #2   Title Pt will undergo further assessment of stairs - LTG written as appropriate.    Time 4    Period Weeks    Status New      PT SHORT TERM GOAL #3   Title Pt will decr TUG time to 1 minute and 40 seconds with RW in order to decr fall risk.    Baseline 1 minute and 55.9 seconds, 1 minute and 33 seconds on 04/17/20    Time 4    Period Weeks    Status Achieved      PT SHORT TERM GOAL #4   Title Pt will improve gait speed to at least .6 ft/sec with RW in order to decr fall risk.    Baseline .39 ft/sec    Time 4    Period Weeks    Status Revised      PT SHORT TERM GOAL #5   Title Pt will decr 5x sit <> stand time to 55 seconds or less using BUE support from standard height chair in order to improve functional BLE strength.    Baseline 1 minute and 9 seconds, 34.71 seconds on 04/17/20    Time 4    Period Weeks    Status Achieved             PT Long Term Goals - 04/03/20 1158      PT LONG TERM GOAL #1   Title Pt and wife will be independent with final HEP in order to build upon functional gains made in therapy. ALL STGS DUE 05/20/20    Time 8    Period Weeks    Status New      PT LONG TERM GOAL #2   Title Stair goal to be written as appropriate for pt to safely enter/exit home.    Time 8    Period Weeks    Status New      PT LONG  TERM GOAL #3   Title Pt will decr TUG time to 1 minute and 20 seconds with RW in order to decr fall risk    Baseline 1 minute and  55.9 seconds    Time 8    Period Weeks    Status Revised      PT LONG TERM GOAL #4   Title Pt will improve gait speed to at least .8 ft/sec with RW in order to decr fall risk.with RW in order to determine fall risk -  LTG to be written as appropriate.    Baseline .39 ft/sec    Time 8    Period Weeks    Status Revised      PT LONG TERM GOAL #5   Title Pt will decr 5x sit <> stand time to 45 seconds or less using BUE support from standard height chair in order to improve functional BLE strength.    Baseline 1 minute and 9 seconds    Time 8    Period Weeks    Status New                 Plan - 04/29/20 1209    Clinical Impression Statement Focus of today's skilled session was gait and transfer training with RW and sit <> stands for functional BLE strengthening. Pt continues with short step lengths (esp LLE) and a shuffled gait pattern even with use of theraband as visual cue for incr stride length. Pt alert during session and needing intermittent seated rest breaks due to fatigue. Will continue to progress towards LTGs.    Personal Factors and Comorbidities Behavior Pattern;Comorbidity 3+;Time since onset of injury/illness/exacerbation;Past/Current Experience;Fitness    Comorbidities PMH: CVA (12/2018)-right thalamic stroke and left frontal punctate infarct, , diabetes mellitus, HTN, lupus, seizure, depression    Examination-Activity Limitations Bathing;Bed Mobility;Locomotion Level;Squat;Transfers;Stand;Stairs    Examination-Participation Restrictions School    Stability/Clinical Decision Making Evolving/Moderate complexity    Rehab Potential Fair    PT Frequency 2x / week    PT Duration 8 weeks    PT Treatment/Interventions ADLs/Self Care Home Management;DME Instruction;Stair training;Gait training;Functional mobility training;Therapeutic activities;Therapeutic exercise;Balance training;Neuromuscular re-education;Patient/family education;Passive range of motion;Vestibular    PT  Next Visit Plan review new additions to HEP. **pt is wheeled back into session with clinic w/c and also wheeled out due to pt with slow gait speed and fatigue and for time constraints. needs to practice car transfers with RW and with spouse present for incr safety. assess remaining STGs as able . gait training with RW, sit <> stand transfers with RW.    PT Home Exercise Plan Access Code: [email or web address]    Consulted and Agree with Plan of Care Family member/caregiver;Patient    Family Member Consulted wife, [name]           Patient will benefit from skilled therapeutic intervention in order to improve the following deficits and impairments:  Abnormal gait, Decreased balance, Decreased activity tolerance, Decreased coordination, Decreased cognition, Decreased endurance, Decreased knowledge of use of DME, Decreased range of motion, Decreased strength, Difficulty walking, Postural dysfunction  Visit Diagnosis: Unsteadiness on feet  Other abnormalities of gait and mobility  Other symptoms and signs involving the nervous system  Muscle weakness (generalized)     Problem List Patient Active Problem List   Diagnosis Date Noted  . Acute ischemic right MCA stroke ([place]) 12/07/2018  . Stroke ([place]) 12/07/2018  . Moderate dementia without behavioral disturbance ([place]) 07/27/2018  . New onset seizure ([place]) 07/27/2018  .  Depression 12/16/2017  . Essential hypertension 03/26/2017  . Hyperlipidemia 03/26/2017  . Cardiac murmur 03/26/2017  . Mild cognitive impairment, so stated 05/01/2013  . Memory loss 05/01/2013    Arliss Journey, PT, DPT  04/29/2020, 12:16 PM  [facility] [address] [place], [place], [address] Phone: [phone]   Fax:  [phone]  Name: David Irwin MRN: [id number] Date of Birth: 08-Mar-1946

## 2020-05-02 ENCOUNTER — Encounter: Payer: Self-pay | Admitting: Physical Therapy

## 2020-05-02 ENCOUNTER — Ambulatory Visit: Payer: PPO | Admitting: Physical Therapy

## 2020-05-02 ENCOUNTER — Other Ambulatory Visit: Payer: Self-pay

## 2020-05-02 DIAGNOSIS — R29818 Other symptoms and signs involving the nervous system: Secondary | ICD-10-CM

## 2020-05-02 DIAGNOSIS — R2681 Unsteadiness on feet: Secondary | ICD-10-CM

## 2020-05-02 DIAGNOSIS — M6281 Muscle weakness (generalized): Secondary | ICD-10-CM

## 2020-05-02 DIAGNOSIS — R2689 Other abnormalities of gait and mobility: Secondary | ICD-10-CM

## 2020-05-02 NOTE — Therapy (Signed)
[facility] [address] [place], [place], [address] Phone: [phone]   Fax:  [phone]  Physical Therapy Treatment  Patient Details  Name: David Irwin MRN: [id number] Date of Birth: 07-Aug-1946 Referring Provider (PT): Debbora Presto, NP   Encounter Date: 05/02/2020   PT End of Session - 05/02/20 1209    Visit Number 9    Number of Visits 17    Date for PT Re-Evaluation 06/23/20   written for 60 day POC   Authorization Type [employer] PPO - $15 co pay    PT Start Time 1109   pt arrived late   PT Stop Time 1145    PT Time Calculation (min) 36 min    Equipment Utilized During Treatment Gait belt    Activity Tolerance Patient tolerated treatment well;Patient limited by fatigue    Behavior During Therapy [facility] for tasks assessed/performed;Flat affect           Past Medical History:  Diagnosis Date  . CVA (cerebral vascular accident) ([place]) 12/07/2018  . Diabetes mellitus without complication ([place])   . Hypertension   . Lupus ([place])   . Memory loss   . Murmur, cardiac   . Nocturia   . Seizure [facility])     Past Surgical History:  Procedure Laterality Date  . APPENDECTOMY    . EYE SURGERY      There were no vitals filed for this visit.   Subjective Assessment - 05/02/20 1118    Subjective Had a fall on Tuesday - missed the chair when he was about to sit down, was not using his RW. A maintenance man was over at their house and was able to help get him up.    Pertinent History CVA (12/2018)-right thalamic stroke and left frontal punctate infarct, , diabetes mellitus, HTN, lupus, seizure, depression    Patient Stated Goals "wants a normal life", wants to walk normal    Currently in Pain? No/denies                             [facility] Adult PT Treatment/Exercise - 05/02/20 1138      Transfers   Transfers Stand to Sit;Sit to Stand    Sit to Stand 4: Min guard;5: Supervision    Sit to  Stand Details Verbal cues for sequencing;Verbal cues for technique;Verbal cues for precautions/safety;Verbal cues for safe use of DME/AE    Sit to Stand Details (indicate cue type and reason) multiple reps performed throughout session. cues to scoot out towards edge of mat first and for single UE placement on mat and RW     Stand to Sit 4: Min guard;5: Supervision    Stand to Sit Details (indicate cue type and reason) Verbal cues for sequencing;Verbal cues for technique;Verbal cues for precautions/safety;Verbal cues for safe use of DME/AE   cues to reach    Stand to Sit Details cues to reach posterior and feel surface on BLE prior to sitting    Stand Pivot Transfers 4: Min assist    Stand Pivot Transfer Details (indicate cue type and reason) assist intermittently at times for proper steering of RW    Comments TUG shuttle x2 - performing sit <> stand from mat table and ambulating approx. 10' with RW and then turning to sit down in standard arm chair, cues for stride length and verbal and min A for turning RW prior to turns (pt improving with incr  reps and does better when returning to mat table), cues to fully feel BLE on chair/mat table prior to sitting down      Ambulation/Gait   Ambulation/Gait Yes    Ambulation/Gait Assistance 4: Min assist    Ambulation/Gait Assistance Details short distance gait with RW during TUG shuttle, pt continues to have incr forward flexed posture and shuffled gait pattern    Assistive device Rolling walker    Gait Pattern Step-through pattern;Step-to pattern;Decreased stride length;Decreased stance time - left;Decreased weight shift to left;Right foot flat;Left foot flat;Shuffle;Trunk flexed    Ambulation Surface Level;Indoor      Exercises   Exercises Other Exercises    Other Exercises  standing at RW: alternating toe taps to 4" step for incr foot clearance x5 reps B                  PT Education - 05/02/20 1208    Education Details importance of using  RW at all times at home in order to decr fall risk (pt not using RW during most recent fall)    Person(s) Educated Patient;Spouse    Methods Explanation    Comprehension Verbalized understanding;Need further instruction            PT Short Term Goals - 04/17/20 1247      PT SHORT TERM GOAL #1   Title Pt and wife will be independent with initial HEP in order to build upon functional gains made in therapy. ALL STGS DUE 04/22/20    Baseline reports performing HEP more consistently, however not everyday.    Time 4    Period Weeks    Status Partially Met    Target Date 04/22/20      PT SHORT TERM GOAL #2   Title Pt will undergo further assessment of stairs - LTG written as appropriate.    Time 4    Period Weeks    Status New      PT SHORT TERM GOAL #3   Title Pt will decr TUG time to 1 minute and 40 seconds with RW in order to decr fall risk.    Baseline 1 minute and 55.9 seconds, 1 minute and 33 seconds on 04/17/20    Time 4    Period Weeks    Status Achieved      PT SHORT TERM GOAL #4   Title Pt will improve gait speed to at least .6 ft/sec with RW in order to decr fall risk.    Baseline .39 ft/sec    Time 4    Period Weeks    Status Revised      PT SHORT TERM GOAL #5   Title Pt will decr 5x sit <> stand time to 55 seconds or less using BUE support from standard height chair in order to improve functional BLE strength.    Baseline 1 minute and 9 seconds, 34.71 seconds on 04/17/20    Time 4    Period Weeks    Status Achieved             PT Long Term Goals - 04/03/20 1158      PT LONG TERM GOAL #1   Title Pt and wife will be independent with final HEP in order to build upon functional gains made in therapy. ALL STGS DUE 05/20/20    Time 8    Period Weeks    Status New      PT LONG TERM GOAL #2   Title Stair goal  to be written as appropriate for pt to safely enter/exit home.    Time 8    Period Weeks    Status New      PT LONG TERM GOAL #3   Title Pt will decr  TUG time to 1 minute and 20 seconds with RW in order to decr fall risk    Baseline 1 minute and 55.9 seconds    Time 8    Period Weeks    Status Revised      PT LONG TERM GOAL #4   Title Pt will improve gait speed to at least .8 ft/sec with RW in order to decr fall risk.with RW in order to determine fall risk -  LTG to be written as appropriate.    Baseline .39 ft/sec    Time 8    Period Weeks    Status Revised      PT LONG TERM GOAL #5   Title Pt will decr 5x sit <> stand time to 45 seconds or less using BUE support from standard height chair in order to improve functional BLE strength.    Baseline 1 minute and 9 seconds    Time 8    Period Weeks    Status New                 Plan - 05/02/20 1210    Clinical Impression Statement Focused on performing the TUG shuttle today with RW to work on sit <> stands, short distance gait, and turns with RW due to pt's most recent fall trying to sit down in chair. Educated pt and pt's spouse on proper cueing to make sure pt fully backs up to chair and feels it  behind BLE before sitting down. Instructed on use of RW at all times at home due to fall risk. Pt continues to need min A at times and cues for proper turning of RW. Will continue to progress towards LTGs.    Personal Factors and Comorbidities Behavior Pattern;Comorbidity 3+;Time since onset of injury/illness/exacerbation;Past/Current Experience;Fitness    Comorbidities PMH: CVA (12/2018)-right thalamic stroke and left frontal punctate infarct, , diabetes mellitus, HTN, lupus, seizure, depression    Examination-Activity Limitations Bathing;Bed Mobility;Locomotion Level;Squat;Transfers;Stand;Stairs    Examination-Participation Restrictions School    Stability/Clinical Decision Making Evolving/Moderate complexity    Rehab Potential Fair    PT Frequency 2x / week    PT Duration 8 weeks    PT Treatment/Interventions ADLs/Self Care Home Management;DME Instruction;Stair training;Gait  training;Functional mobility training;Therapeutic activities;Therapeutic exercise;Balance training;Neuromuscular re-education;Patient/family education;Passive range of motion;Vestibular    PT Next Visit Plan will need 10th visit PN. **pt is wheeled back into session with clinic w/c and also wheeled out due to pt with slow gait speed and fatigue and for time constraints. needs to practice car transfers with RW and with spouse present for incr safety.  gait training with RW, sit <> stand transfers with RW. [employer]?    PT Home Exercise Plan Access Code: [email or web address]    Consulted and Agree with Plan of Care Family member/caregiver;Patient    Family Member Consulted wife, [name]           Patient will benefit from skilled therapeutic intervention in order to improve the following deficits and impairments:  Abnormal gait, Decreased balance, Decreased activity tolerance, Decreased coordination, Decreased cognition, Decreased endurance, Decreased knowledge of use of DME, Decreased range of motion, Decreased strength, Difficulty walking, Postural dysfunction  Visit Diagnosis: Unsteadiness on feet  Other abnormalities of gait and mobility  Other symptoms and signs involving the nervous system  Muscle weakness (generalized)     Problem List Patient Active Problem List   Diagnosis Date Noted  . Acute ischemic right MCA stroke ([place]) 12/07/2018  . Stroke ([place]) 12/07/2018  . Moderate dementia without behavioral disturbance ([place]) 07/27/2018  . New onset seizure ([place]) 07/27/2018  . Depression 12/16/2017  . Essential hypertension 03/26/2017  . Hyperlipidemia 03/26/2017  . Cardiac murmur 03/26/2017  . Mild cognitive impairment, so stated 05/01/2013  . Memory loss 05/01/2013    Arliss Journey, PT, DPT  05/02/2020, 12:16 PM  [facility] [address] [place], [place], [address] Phone: [phone]   Fax:  [phone]  Name: David Irwin MRN: [id number] Date of Birth: 05/08/1946

## 2020-05-06 ENCOUNTER — Ambulatory Visit: Payer: PPO | Attending: Family Medicine | Admitting: Physical Therapy

## 2020-05-06 ENCOUNTER — Other Ambulatory Visit: Payer: Self-pay | Admitting: *Deleted

## 2020-05-06 ENCOUNTER — Other Ambulatory Visit: Payer: Self-pay

## 2020-05-06 VITALS — BP 134/72 | HR 80

## 2020-05-06 DIAGNOSIS — R2689 Other abnormalities of gait and mobility: Secondary | ICD-10-CM | POA: Insufficient documentation

## 2020-05-06 DIAGNOSIS — R2681 Unsteadiness on feet: Secondary | ICD-10-CM | POA: Insufficient documentation

## 2020-05-06 DIAGNOSIS — R29818 Other symptoms and signs involving the nervous system: Secondary | ICD-10-CM | POA: Diagnosis not present

## 2020-05-06 DIAGNOSIS — M6281 Muscle weakness (generalized): Secondary | ICD-10-CM | POA: Insufficient documentation

## 2020-05-06 MED ORDER — MEMANTINE HCL 5 MG PO TABS: 5.0000 mg | ORAL_TABLET | Freq: Two times a day (BID) | ORAL | 6 refills | Status: DC

## 2020-05-06 NOTE — Therapy (Signed)
[facility] [address] [place], [place], [address] Phone: [phone]   Fax:  [phone]  Physical Therapy Treatment/10th Visit Progress Note  Patient Details  Name: David Irwin MRN: [id number] Date of Birth: 09/24/1946 Referring Provider (PT): Amy Lomax, NP   10th Visit Physical Therapy Progress Note  Dates of Reporting Period: 03/25/20 to 05/06/20   Encounter Date: 05/06/2020   PT End of Session - 05/06/20 1156    Visit Number 10    Number of Visits 17    Date for PT Re-Evaluation 06/23/20   written for 60 day POC   Authorization Type [employer] PPO - $15 co pay    PT Start Time 1017    PT Stop Time 1059    PT Time Calculation (min) 42 min    Equipment Utilized During Treatment Gait belt    Activity Tolerance Patient tolerated treatment well;Patient limited by fatigue    Behavior During Therapy WFL for tasks assessed/performed;Flat affect           Past Medical History:  Diagnosis Date  . CVA (cerebral vascular accident) (HCC) 12/07/2018  . Diabetes mellitus without complication (HCC)   . Hypertension   . Lupus (HCC)   . Memory loss   . Murmur, cardiac   . Nocturia   . Seizure (HCC)     Past Surgical History:  Procedure Laterality Date  . APPENDECTOMY    . EYE SURGERY      Vitals:   05/06/20 1025  BP: (!) 134/72  Pulse: 80     Subjective Assessment - 05/06/20 1024    Subjective Wife reports he is feeling a little more confused and unbalanced this morning. Did not have the chance to check his blood sugar and he didn't eat anything for breakfast.    Pertinent History CVA (12/2018)-right thalamic stroke and left frontal punctate infarct, , diabetes mellitus, HTN, lupus, seizure, depression    Patient Stated Goals "wants a normal life", wants to walk normal    Currently in Pain? No/denies                             [facility] Adult PT Treatment/Exercise - 05/06/20  1039      Transfers   Transfers Stand to Sit;Sit to Stand    Sit to Stand 4: Min guard;5: Supervision    Sit to Stand Details Verbal cues for sequencing;Verbal cues for technique;Verbal cues for precautions/safety;Verbal cues for safe use of DME/AE    Sit to Stand Details (indicate cue type and reason) multiple reps throughout session, needs cues initially to scoot towards edge of mat and for proper UE placement on mat table and RW    Stand to Sit 4: Min guard;5: Supervision    Stand to Sit Details (indicate cue type and reason) Verbal cues for sequencing;Verbal cues for technique;Verbal cues for precautions/safety;Verbal cues for safe use of DME/AE    Stand Pivot Transfers 4: Min assist    Stand Pivot Transfer Details (indicate cue type and reason) verbal and assist from therapist for RW management when turning, x2 reps      Ambulation/Gait   Pre-Gait Activities standing at edge of mat with RW: alternating stepping to colorful floor tiles for incr step length B, verbal and demo cues for technique 2 x 6 reps B, needing seated rest break between each set       Exercises   Exercises Other Exercises      Other Exercises  in seated: lateral step outs to tap for hip flexor strengthening and incr foot clearance yoga block x7 reps B, hip ADD ball squeezes x12 reps, 2 x 5 reps B LAQs with use of 2# ankle weight - pt needing verbal and demo cues for all exercises and pt with difficulty sequencing at times.                   PT Education - 05/06/20 1155    Education Details pt's wife reporting a decline in pt's cognition with pt going to follow up with MD next week, discussed possibly transitioning to home health PT    Person(s) Educated Patient;Spouse    Methods Explanation    Comprehension Verbalized understanding            PT Short Term Goals - 05/06/20 1157      PT SHORT TERM GOAL #1   Title Pt and wife will be independent with initial HEP in order to build upon functional gains  made in therapy. ALL STGS DUE 04/22/20    Baseline reports performing HEP more consistently, however not everyday.    Time 4    Period Weeks    Status Partially Met    Target Date 04/22/20      PT SHORT TERM GOAL #2   Title Pt will undergo further assessment of stairs - LTG written as appropriate.    Baseline unable to assess to to fatigue    Time 4    Period Weeks    Status Deferred      PT SHORT TERM GOAL #3   Title Pt will decr TUG time to 1 minute and 40 seconds with RW in order to decr fall risk.    Baseline 1 minute and 55.9 seconds, 1 minute and 33 seconds on 04/17/20    Time 4    Period Weeks    Status Achieved      PT SHORT TERM GOAL #4   Title Pt will improve gait speed to at least .6 ft/sec with RW in order to decr fall risk.    Baseline .39 ft/sec    Time 4    Period Weeks    Status Revised      PT SHORT TERM GOAL #5   Title Pt will decr 5x sit <> stand time to 55 seconds or less using BUE support from standard height chair in order to improve functional BLE strength.    Baseline 1 minute and 9 seconds, 34.71 seconds on 04/17/20    Time 4    Period Weeks    Status Achieved             PT Long Term Goals - 04/03/20 1158      PT LONG TERM GOAL #1   Title Pt and wife will be independent with final HEP in order to build upon functional gains made in therapy. ALL STGS DUE 05/20/20    Time 8    Period Weeks    Status New      PT LONG TERM GOAL #2   Title Stair goal to be written as appropriate for pt to safely enter/exit home.    Time 8    Period Weeks    Status New      PT LONG TERM GOAL #3   Title Pt will decr TUG time to 1 minute and 20 seconds with RW in order to decr fall risk    Baseline 1 minute and   55.9 seconds    Time 8    Period Weeks    Status Revised      PT LONG TERM GOAL #4   Title Pt will improve gait speed to at least .8 ft/sec with RW in order to decr fall risk.with RW in order to determine fall risk -  LTG to be written as appropriate.     Baseline .39 ft/sec    Time 8    Period Weeks    Status Revised      PT LONG TERM GOAL #5   Title Pt will decr 5x sit <> stand time to 45 seconds or less using BUE support from standard height chair in order to improve functional BLE strength.    Baseline 1 minute and 9 seconds    Time 8    Period Weeks    Status New               Plan - 05/06/20 1220    Clinical Impression Statement 10th visit progress note: Pt continues to be wheeled in and out of session in manual wheelchair from clinic due to conserve time and pt with incr fatigue with gait and unable to walk back into clinic. Pt's wife reports that pt has been feeling more confused and imbalanced today - pt did not eat breakfast or wife did not check his sugars. Pt's BP WFL and PT provided pt with crackers prior to starting session. Pt reporting feeling no lightheadedness during session. Unable to formally assess goals for 10th visit PN. Pt too fatigued to perform gait today with RW. Pt continues to need min guard/min A and verbal/tactile cues for proper sequencing for sit <> stand with RW and for stand pivot transfers with RW. Pt fatigues easily and needed multiple seated rest breaks throughout session. Will continue to progress towards LTGs.    Personal Factors and Comorbidities Behavior Pattern;Comorbidity 3+;Time since onset of injury/illness/exacerbation;Past/Current Experience;Fitness    Comorbidities PMH: CVA (12/2018)-right thalamic stroke and left frontal punctate infarct, , diabetes mellitus, HTN, lupus, seizure, depression    Examination-Activity Limitations Bathing;Bed Mobility;Locomotion Level;Squat;Transfers;Stand;Stairs    Examination-Participation Restrictions School    Stability/Clinical Decision Making Evolving/Moderate complexity    Rehab Potential Fair    PT Frequency 2x / week    PT Duration 8 weeks    PT Treatment/Interventions ADLs/Self Care Home Management;DME Instruction;Stair training;Gait  training;Functional mobility training;Therapeutic activities;Therapeutic exercise;Balance training;Neuromuscular re-education;Patient/family education;Passive range of motion;Vestibular    PT Next Visit Plan **pt is wheeled back into session with clinic w/c and also wheeled out due to pt with slow gait speed and fatigue and for time constraints. needs to practice car transfers with RW and with spouse present for incr safety.  gait training with RW, sit <> stand transfers with RW. Sci[employer]?    PT Home Exercise Plan Access Code: [email or web address]    Consulted and Agree with Plan of Care Family member/caregiver;Patient    Family Member Consulted wife, [name]           Patient will benefit from skilled therapeutic intervention in order to improve the following deficits and impairments:  Abnormal gait, Decreased balance, Decreased activity tolerance, Decreased coordination, Decreased cognition, Decreased endurance, Decreased knowledge of use of DME, Decreased range of motion, Decreased strength, Difficulty walking, Postural dysfunction  Visit Diagnosis: Unsteadiness on feet  Other abnormalities of gait and mobility  Other symptoms and signs involving the nervous system  Muscle weakness (generalized)     Problem List Patient Active Problem List  Diagnosis Date Noted  . Acute ischemic right MCA stroke ([place]) 12/07/2018  . Stroke ([place]) 12/07/2018  . Moderate dementia without behavioral disturbance ([place]) 07/27/2018  . New onset seizure ([place]) 07/27/2018  . Depression 12/16/2017  . Essential hypertension 03/26/2017  . Hyperlipidemia 03/26/2017  . Cardiac murmur 03/26/2017  . Mild cognitive impairment, so stated 05/01/2013  . Memory loss 05/01/2013    Arliss Journey, PT, DPT  05/06/2020, 12:20 PM  [facility] [address] [place], [place], [address] Phone: [phone]   Fax:  [phone]  Name: David Irwin MRN: [id number] Date  of Birth: 01/18/1946

## 2020-05-08 ENCOUNTER — Encounter: Payer: Self-pay | Admitting: Physical Therapy

## 2020-05-08 ENCOUNTER — Other Ambulatory Visit: Payer: Self-pay

## 2020-05-08 ENCOUNTER — Ambulatory Visit: Payer: PPO | Admitting: Physical Therapy

## 2020-05-08 DIAGNOSIS — R29818 Other symptoms and signs involving the nervous system: Secondary | ICD-10-CM

## 2020-05-08 DIAGNOSIS — R2681 Unsteadiness on feet: Secondary | ICD-10-CM | POA: Diagnosis not present

## 2020-05-08 DIAGNOSIS — R2689 Other abnormalities of gait and mobility: Secondary | ICD-10-CM

## 2020-05-08 DIAGNOSIS — M6281 Muscle weakness (generalized): Secondary | ICD-10-CM

## 2020-05-08 NOTE — Therapy (Signed)
[facility] [address] [place], [place], [address] Phone: [phone]   Fax:  [phone]  Physical Therapy Treatment  Patient Details  Name: David Irwin MRN: [id number] Date of Birth: [birth year] Referring Provider (PT): Debbora Presto, NP   Encounter Date: 05/08/2020   PT End of Session - 05/08/20 1023    Visit Number 11    Number of Visits 17    Date for PT Re-Evaluation 06/23/20   written for 60 day POC   Authorization Type [employer] PPO - $15 co pay    PT Start Time 1016    PT Stop Time 1100    PT Time Calculation (min) 44 min    Equipment Utilized During Treatment Gait belt    Activity Tolerance Patient tolerated treatment well;Patient limited by fatigue    Behavior During Therapy [facility] for tasks assessed/performed;Flat affect           Past Medical History:  Diagnosis Date  . CVA (cerebral vascular accident) ([place]) 12/07/2018  . Diabetes mellitus without complication ([place])   . Hypertension   . Lupus ([place])   . Memory loss   . Murmur, cardiac   . Nocturia   . Seizure [facility])     Past Surgical History:  Procedure Laterality Date  . APPENDECTOMY    . EYE SURGERY      There were no vitals filed for this visit.   Subjective Assessment - 05/08/20 1022    Subjective No new complaints. No falls or pain to report. "I'm just weak" .    Patient is accompained by: Family member   spouse [name]   Pertinent History CVA (12/2018)-right thalamic stroke and left frontal punctate infarct, , diabetes mellitus, HTN, lupus, seizure, depression    Patient Stated Goals "wants a normal life", wants to walk normal    Currently in Pain? No/denies    Pain Score 0-No pain                   [facility] Adult PT Treatment/Exercise - 05/08/20 1023      Transfers   Transfers Stand to Sit;Sit to Stand    Sit to Stand 4: Min guard;With upper extremity assist;From chair/3-in-1    Sit to Stand Details Verbal cues for  sequencing;Verbal cues for technique;Verbal cues for precautions/safety;Verbal cues for safe use of DME/AE    Sit to Stand Details (indicate cue type and reason) cues to scoot to edge and for anterior weight shifting    Stand to Sit 4: Min guard;With upper extremity assist;To chair/3-in-1    Stand to Sit Details (indicate cue type and reason) Verbal cues for sequencing;Verbal cues for technique;Verbal cues for precautions/safety;Verbal cues for safe use of DME/AE    Stand to Sit Details cues to reach back for controlled descent    Comments increased time needed for maximial pt effort with transfers      Ambulation/Gait   Ambulation/Gait Yes    Ambulation/Gait Assistance 4: Min guard    Ambulation Distance (Feet) 30 Feet   x2   Assistive device Rolling walker    Gait Pattern Step-through pattern;Step-to pattern;Decreased stride length;Decreased stance time - left;Decreased weight shift to left;Right foot flat;Left foot flat;Shuffle;Trunk flexed    Ambulation Surface Level;Indoor      Knee/Hip Exercises: Aerobic   Other Aerobic Scifit UE/LE's level 2.0 for 8 minutes with rpm 15-20 for strengthening and activity tolerance. cues needed to keep going and to maintain speed/rpm  Knee/Hip Exercises: Seated   Long Arc Quad AROM;Strengthening;Both;10 reps;Limitations;Weights;2 sets    [employer] Weight 2 lbs.    Long [employer] Limitations verbal/demo cues for ex form/technique    Other Seated Knee/Hip Exercises with 2# ankle weights- heel<>toe raises for 2 sets of 10 reps.     Marching AROM;Strengthening;Both;2 sets;10 reps;Weights;Limitations    Marching Limitations cues for increased height with PTA hand as a target, alternating LE's.     Marching Weights 2 lbs.    Hamstring Curl AROM;Strengthening;Both;2 sets;10 reps;Limitations    Hamstring Limitations with red theraband resistance                    PT Short Term Goals - 05/06/20 1157      PT SHORT TERM GOAL #1   Title  Pt and wife will be independent with initial HEP in order to build upon functional gains made in therapy. ALL STGS DUE 04/22/20    Baseline reports performing HEP more consistently, however not everyday.    Time 4    Period Weeks    Status Partially Met    Target Date 04/22/20      PT SHORT TERM GOAL #2   Title Pt will undergo further assessment of stairs - LTG written as appropriate.    Baseline unable to assess to to fatigue    Time 4    Period Weeks    Status Deferred      PT SHORT TERM GOAL #3   Title Pt will decr TUG time to 1 minute and 40 seconds with RW in order to decr fall risk.    Baseline 1 minute and 55.9 seconds, 1 minute and 33 seconds on 04/17/20    Time 4    Period Weeks    Status Achieved      PT SHORT TERM GOAL #4   Title Pt will improve gait speed to at least .6 ft/sec with RW in order to decr fall risk.    Baseline .39 ft/sec    Time 4    Period Weeks    Status Revised      PT SHORT TERM GOAL #5   Title Pt will decr 5x sit <> stand time to 55 seconds or less using BUE support from standard height chair in order to improve functional BLE strength.    Baseline 1 minute and 9 seconds, 34.71 seconds on 04/17/20    Time 4    Period Weeks    Status Achieved             PT Long Term Goals - 04/03/20 1158      PT LONG TERM GOAL #1   Title Pt and wife will be independent with final HEP in order to build upon functional gains made in therapy. ALL STGS DUE 05/20/20    Time 8    Period Weeks    Status New      PT LONG TERM GOAL #2   Title Stair goal to be written as appropriate for pt to safely enter/exit home.    Time 8    Period Weeks    Status New      PT LONG TERM GOAL #3   Title Pt will decr TUG time to 1 minute and 20 seconds with RW in order to decr fall risk    Baseline 1 minute and 55.9 seconds    Time 8    Period Weeks    Status Revised  PT LONG TERM GOAL #4   Title Pt will improve gait speed to at least .8 ft/sec with RW in order to  decr fall risk.with RW in order to determine fall risk -  LTG to be written as appropriate.    Baseline .39 ft/sec    Time 8    Period Weeks    Status Revised      PT LONG TERM GOAL #5   Title Pt will decr 5x sit <> stand time to 45 seconds or less using BUE support from standard height chair in order to improve functional BLE strength.    Baseline 1 minute and 9 seconds    Time 8    Period Weeks    Status New                 Plan - 05/08/20 1023    Clinical Impression Statement Today's skilled session continued to focus on gait with RW and strengthening. Short seated rest breaks needed throughout session. Pt able to follow all directions today without delay as he was more alert and oriented today. The pt make progress today and should benefit from continued PT to progress toward unmet goals.    Personal Factors and Comorbidities Behavior Pattern;Comorbidity 3+;Time since onset of injury/illness/exacerbation;Past/Current Experience;Fitness    Comorbidities PMH: CVA (12/2018)-right thalamic stroke and left frontal punctate infarct, , diabetes mellitus, HTN, lupus, seizure, depression    Examination-Activity Limitations Bathing;Bed Mobility;Locomotion Level;Squat;Transfers;Stand;Stairs    Examination-Participation Restrictions School    Stability/Clinical Decision Making Evolving/Moderate complexity    Rehab Potential Fair    PT Frequency 2x / week    PT Duration 8 weeks    PT Treatment/Interventions ADLs/Self Care Home Management;DME Instruction;Stair training;Gait training;Functional mobility training;Therapeutic activities;Therapeutic exercise;Balance training;Neuromuscular re-education;Patient/family education;Passive range of motion;Vestibular    PT Next Visit Plan **pt is wheeled back into session with clinic w/c and also wheeled out due to pt with slow gait speed and fatigue and for time constraints. needs to practice car transfers with RW and with spouse present for incr safety.   gait training with RW, sit <> stand transfers with RW. Sci[employer]?    PT Home Exercise Plan Access Code: [email or web address]    Consulted and Agree with Plan of Care Family member/caregiver;Patient    Family Member Consulted wife, [name]           Patient will benefit from skilled therapeutic intervention in order to improve the following deficits and impairments:  Abnormal gait, Decreased balance, Decreased activity tolerance, Decreased coordination, Decreased cognition, Decreased endurance, Decreased knowledge of use of DME, Decreased range of motion, Decreased strength, Difficulty walking, Postural dysfunction  Visit Diagnosis: Unsteadiness on feet  Other abnormalities of gait and mobility  Other symptoms and signs involving the nervous system  Muscle weakness (generalized)     Problem List Patient Active Problem List   Diagnosis Date Noted  . Acute ischemic right MCA stroke ([place]) 12/07/2018  . Stroke ([place]) 12/07/2018  . Moderate dementia without behavioral disturbance ([place]) 07/27/2018  . New onset seizure ([facility]) 07/27/2018  . Depression 12/16/2017  . Essential hypertension 03/26/2017  . Hyperlipidemia 03/26/2017  . Cardiac murmur 03/26/2017  . Mild cognitive impairment, so stated 05/01/2013  . Memory loss 05/01/2013    Willow Ora, PTA, [facility] [address], [place], [place] [address] [phone] 05/08/20, 5:11 PM   Name: Mitul Hallowell MRN: [id number] Date of Birth: Oct 18, 1945

## 2020-05-13 ENCOUNTER — Ambulatory Visit: Payer: PPO | Admitting: Physical Therapy

## 2020-05-15 ENCOUNTER — Ambulatory Visit: Payer: PPO | Admitting: Physical Therapy

## 2020-05-15 ENCOUNTER — Other Ambulatory Visit: Payer: Self-pay

## 2020-05-15 DIAGNOSIS — R2681 Unsteadiness on feet: Secondary | ICD-10-CM | POA: Diagnosis not present

## 2020-05-15 DIAGNOSIS — R29818 Other symptoms and signs involving the nervous system: Secondary | ICD-10-CM

## 2020-05-15 DIAGNOSIS — M6281 Muscle weakness (generalized): Secondary | ICD-10-CM

## 2020-05-15 DIAGNOSIS — R2689 Other abnormalities of gait and mobility: Secondary | ICD-10-CM

## 2020-05-15 NOTE — Therapy (Signed)
[facility] [address] [place], [place], [address] Phone: [phone]   Fax:  [phone]  Physical Therapy Treatment  Patient Details  Name: David Irwin MRN: [id number] Date of Birth: Oct 08, 1945 Referring Provider (PT): Debbora Presto, NP   Encounter Date: 05/15/2020   PT End of Session - 05/15/20 1109    Visit Number 12    Number of Visits 17    Date for PT Re-Evaluation 06/23/20   written for 60 day POC   Authorization Type [employer] PPO - $15 co pay    PT Start Time 1018    PT Stop Time 1100    PT Time Calculation (min) 42 min    Equipment Utilized During Treatment Gait belt    Activity Tolerance Patient tolerated treatment well;Patient limited by fatigue    Behavior During Therapy [facility] for tasks assessed/performed;Flat affect           Past Medical History:  Diagnosis Date  . CVA (cerebral vascular accident) ([place]) 12/07/2018  . Diabetes mellitus without complication ([place])   . Hypertension   . Lupus ([place])   . Memory loss   . Murmur, cardiac   . Nocturia   . Seizure [facility])     Past Surgical History:  Procedure Laterality Date  . APPENDECTOMY    . EYE SURGERY      There were no vitals filed for this visit.   Subjective Assessment - 05/15/20 1022    Subjective Things are going much better today. No falls. Goes to see the doctor tomorrow.    Patient is accompained by: Family member   spouse David Irwin   Pertinent History CVA (12/2018)-right thalamic stroke and left frontal punctate infarct, , diabetes mellitus, HTN, lupus, seizure, depression    Patient Stated Goals "wants a normal life", wants to walk normal    Currently in Pain? No/denies                             [facility] Adult PT Treatment/Exercise - 05/15/20 1030      Transfers   Transfers Stand to Sit;Sit to Stand    Sit to Stand 4: Min guard;With upper extremity assist;From chair/3-in-1    Sit to Stand Details Verbal  cues for sequencing;Verbal cues for technique;Verbal cues for precautions/safety;Verbal cues for safe use of DME/AE    Sit to Stand Details (indicate cue type and reason) cues to scoot towards edge of w/c/ mat first prior to standing    Five time sit to stand comments  x5 reps from edge of mat table with RW in standing for balance, cues for posture once in standing     Stand to Sit 4: Min guard;With upper extremity assist;To chair/3-in-1    Stand to Sit Details (indicate cue type and reason) Verbal cues for sequencing;Verbal cues for technique;Verbal cues for precautions/safety;Verbal cues for safe use of DME/AE    Stand to Sit Details verbal and manual cues to reach back towards chair/surface to sit    Stand Pivot Transfers 4: Min assist    Stand Pivot Transfer Details (indicate cue type and reason) verbal and manual cues from therapist for turning RW x3 reps throughout session    Comments takes incr time with transfers      Ambulation/Gait   Ambulation/Gait Yes    Ambulation/Gait Assistance 4: Min guard    Ambulation/Gait Assistance Details verbal cues for posture and steplength B  Ambulation Distance (Feet) 20 Feet    Assistive device Rolling walker    Gait Pattern Step-through pattern;Step-to pattern;Decreased stride length;Decreased stance time - left;Decreased weight shift to left;Right foot flat;Left foot flat;Shuffle;Trunk flexed    Ambulation Surface Level;Indoor      Knee/Hip Exercises: Aerobic   Other Aerobic SciFit with BLE and BUE for strengthening, ROM, and activity tolerance level 2.0 for 7 minutes, with rpm at 20-25       Knee/Hip Exercises: Standing   Other Standing Knee Exercises standing at edge of mat with RW: 2 x 10 reps alternating marching with 2# ankle weight, min guard from therapist, needing verbal cues to alternate legs      Knee/Hip Exercises: Seated   Long Arc Quad AROM;Strengthening;Both;10 reps;Limitations;Weights;2 sets    Long Arc Quad Weight 2 lbs.     Long [employer] Limitations visual cue from therapist for pt to kick up towards hand, holding at end range for 3 seconds    Hamstring Curl AROM;Strengthening;Both;2 sets;10 reps;Limitations    Hamstring Limitations with red theraband resistance                    PT Short Term Goals - 05/06/20 1157      PT SHORT TERM GOAL #1   Title Pt and wife will be independent with initial HEP in order to build upon functional gains made in therapy. ALL STGS DUE 04/22/20    Baseline reports performing HEP more consistently, however not everyday.    Time 4    Period Weeks    Status Partially Met    Target Date 04/22/20      PT SHORT TERM GOAL #2   Title Pt will undergo further assessment of stairs - LTG written as appropriate.    Baseline unable to assess to to fatigue    Time 4    Period Weeks    Status Deferred      PT SHORT TERM GOAL #3   Title Pt will decr TUG time to 1 minute and 40 seconds with RW in order to decr fall risk.    Baseline 1 minute and 55.9 seconds, 1 minute and 33 seconds on 04/17/20    Time 4    Period Weeks    Status Achieved      PT SHORT TERM GOAL #4   Title Pt will improve gait speed to at least .6 ft/sec with RW in order to decr fall risk.    Baseline .39 ft/sec    Time 4    Period Weeks    Status Revised      PT SHORT TERM GOAL #5   Title Pt will decr 5x sit <> stand time to 55 seconds or less using BUE support from standard height chair in order to improve functional BLE strength.    Baseline 1 minute and 9 seconds, 34.71 seconds on 04/17/20    Time 4    Period Weeks    Status Achieved             PT Long Term Goals - 04/03/20 1158      PT LONG TERM GOAL #1   Title Pt and wife will be independent with final HEP in order to build upon functional gains made in therapy. ALL STGS DUE 05/20/20    Time 8    Period Weeks    Status New      PT LONG TERM GOAL #2   Title Stair goal to be written  as appropriate for pt to safely enter/exit home.     Time 8    Period Weeks    Status New      PT LONG TERM GOAL #3   Title Pt will decr TUG time to 1 minute and 20 seconds with RW in order to decr fall risk    Baseline 1 minute and 55.9 seconds    Time 8    Period Weeks    Status Revised      PT LONG TERM GOAL #4   Title Pt will improve gait speed to at least .8 ft/sec with RW in order to decr fall risk.with RW in order to determine fall risk -  LTG to be written as appropriate.    Baseline .39 ft/sec    Time 8    Period Weeks    Status Revised      PT LONG TERM GOAL #5   Title Pt will decr 5x sit <> stand time to 45 seconds or less using BUE support from standard height chair in order to improve functional BLE strength.    Baseline 1 minute and 9 seconds    Time 8    Period Weeks    Status New                 Plan - 05/15/20 1110    Clinical Impression Statement Focus of today's skilled session was transfer training and BLE strengthening. Pt more alert and oriented througout today's session and needed less prolonged rest breaks. Pt continues to take incr time and needing min A when turning RW when performing stand pivot transfers. Will continue to progress towards LTGs    Personal Factors and Comorbidities Behavior Pattern;Comorbidity 3+;Time since onset of injury/illness/exacerbation;Past/Current Experience;Fitness    Comorbidities PMH: CVA (12/2018)-right thalamic stroke and left frontal punctate infarct, , diabetes mellitus, HTN, lupus, seizure, depression    Examination-Activity Limitations Bathing;Bed Mobility;Locomotion Level;Squat;Transfers;Stand;Stairs    Examination-Participation Restrictions School    Stability/Clinical Decision Making Evolving/Moderate complexity    Rehab Potential Fair    PT Frequency 2x / week    PT Duration 8 weeks    PT Treatment/Interventions ADLs/Self Care Home Management;DME Instruction;Stair training;Gait training;Functional mobility training;Therapeutic activities;Therapeutic  exercise;Balance training;Neuromuscular re-education;Patient/family education;Passive range of motion;Vestibular    PT Next Visit Plan LTGs due 05/20/20 **pt is wheeled back into session with clinic w/c and also wheeled out due to pt with slow gait speed and fatigue and for time constraints. needs to practice car transfers with RW and with spouse present for incr safety.  gait training with RW, sit <> stand transfers with RW. SciFit?    PT Home Exercise Plan Access Code: [email or web address]    Consulted and Agree with Plan of Care Family member/caregiver;Patient    Family Member Consulted wife, David Irwin           Patient will benefit from skilled therapeutic intervention in order to improve the following deficits and impairments:  Abnormal gait, Decreased balance, Decreased activity tolerance, Decreased coordination, Decreased cognition, Decreased endurance, Decreased knowledge of use of DME, Decreased range of motion, Decreased strength, Difficulty walking, Postural dysfunction  Visit Diagnosis: Unsteadiness on feet  Other abnormalities of gait and mobility  Other symptoms and signs involving the nervous system  Muscle weakness (generalized)     Problem List Patient Active Problem List   Diagnosis Date Noted  . Acute ischemic right MCA stroke ([place]) 12/07/2018  . Stroke ([place]) 12/07/2018  . Moderate dementia without behavioral disturbance ([place]) 07/27/2018  .  New onset seizure ([place]) 07/27/2018  . Depression 12/16/2017  . Essential hypertension 03/26/2017  . Hyperlipidemia 03/26/2017  . Cardiac murmur 03/26/2017  . Mild cognitive impairment, so stated 05/01/2013  . Memory loss 05/01/2013     Arliss Journey, PT, DPT  05/15/2020, 11:11 AM  [facility] [address] [place], [place], [address] Phone: [phone]   Fax:  [phone]  Name: Timotheus Salm MRN: [id number] Date of Birth: 1946-08-27

## 2020-05-16 DIAGNOSIS — I739 Peripheral vascular disease, unspecified: Secondary | ICD-10-CM | POA: Diagnosis not present

## 2020-05-17 DIAGNOSIS — F0390 Unspecified dementia without behavioral disturbance: Secondary | ICD-10-CM | POA: Diagnosis not present

## 2020-05-17 DIAGNOSIS — D649 Anemia, unspecified: Secondary | ICD-10-CM | POA: Diagnosis not present

## 2020-05-17 DIAGNOSIS — F329 Major depressive disorder, single episode, unspecified: Secondary | ICD-10-CM | POA: Diagnosis not present

## 2020-05-17 DIAGNOSIS — E1169 Type 2 diabetes mellitus with other specified complication: Secondary | ICD-10-CM | POA: Diagnosis not present

## 2020-05-17 DIAGNOSIS — N183 Chronic kidney disease, stage 3 unspecified: Secondary | ICD-10-CM | POA: Diagnosis not present

## 2020-05-17 DIAGNOSIS — I639 Cerebral infarction, unspecified: Secondary | ICD-10-CM | POA: Diagnosis not present

## 2020-05-17 DIAGNOSIS — I10 Essential (primary) hypertension: Secondary | ICD-10-CM | POA: Diagnosis not present

## 2020-05-17 DIAGNOSIS — E7800 Pure hypercholesterolemia, unspecified: Secondary | ICD-10-CM | POA: Diagnosis not present

## 2020-05-20 ENCOUNTER — Other Ambulatory Visit: Payer: Self-pay

## 2020-05-20 ENCOUNTER — Ambulatory Visit: Payer: PPO | Admitting: Physical Therapy

## 2020-05-20 DIAGNOSIS — M6281 Muscle weakness (generalized): Secondary | ICD-10-CM

## 2020-05-20 DIAGNOSIS — R2681 Unsteadiness on feet: Secondary | ICD-10-CM | POA: Diagnosis not present

## 2020-05-20 DIAGNOSIS — R2689 Other abnormalities of gait and mobility: Secondary | ICD-10-CM

## 2020-05-20 DIAGNOSIS — R29818 Other symptoms and signs involving the nervous system: Secondary | ICD-10-CM

## 2020-05-20 NOTE — Therapy (Signed)
[facility] [address] [place], [place], [address] Phone: [phone]   Fax:  [phone]  Physical Therapy Treatment/Re-Cert  Patient Details  Name: David Irwin MRN: [id number] Date of Birth: [birth year] Referring Provider (PT): Debbora Presto, NP   Encounter Date: 05/20/2020   PT End of Session - 05/20/20 1337    Visit Number 13    Number of Visits 20    Date for PT Re-Evaluation 07/19/20   extending POC for additional 2x week for 3-4 weeks   Authorization Type [employer] PPO - $15 co pay    PT Start Time 4403   pt arrived late   PT Stop Time 1100    PT Time Calculation (min) 39 min    Equipment Utilized During Treatment Gait belt    Activity Tolerance Patient tolerated treatment well;Patient limited by fatigue    Behavior During Therapy [facility] for tasks assessed/performed;Flat affect           Past Medical History:  Diagnosis Date  . CVA (cerebral vascular accident) ([place]) 12/07/2018  . Diabetes mellitus without complication ([place])   . Hypertension   . Lupus ([place])   . Memory loss   . Murmur, cardiac   . Nocturia   . Seizure [facility])     Past Surgical History:  Procedure Laterality Date  . APPENDECTOMY    . EYE SURGERY      There were no vitals filed for this visit.   Subjective Assessment - 05/20/20 1026    Subjective Feeling a little slower this morning. No falls. Sees the doctor this thursday.    Patient is accompained by: Family member   spouse David Irwin   Pertinent History CVA (12/2018)-right thalamic stroke and left frontal punctate infarct, , diabetes mellitus, HTN, lupus, seizure, depression    Patient Stated Goals "wants a normal life", wants to walk normal    Currently in Pain? No/denies              [facility] PT Assessment - 05/20/20 1038      Assessment   Medical Diagnosis gait instability    Referring Provider (PT) Debbora Presto, NP      Prior Function   Level of Independence Needs  assistance with ADLs;Independent with household mobility with device      Timed Up and Go Test   Normal TUG (seconds) --   1 minute 29 seconds    TUG Comments with RW - min A  when turning                         [facility] Adult PT Treatment/Exercise - 05/20/20 1038      Transfers   Transfers Stand to Sit;Sit to Stand    Sit to Stand 4: Min guard;With upper extremity assist;From chair/3-in-1    Sit to Stand Details Verbal cues for sequencing;Verbal cues for technique;Verbal cues for precautions/safety;Verbal cues for safe use of DME/AE    Sit to Stand Details (indicate cue type and reason) verbal cues to scoot towards edge of w/c/chair first    Five time sit to stand comments  35.56 seconds with BUE support from standard height chair    Stand to Sit 5: Supervision;4: Min guard;With upper extremity assist;To bed;To chair/3-in-1    Stand to Sit Details verbal cues to reach back to sit    Stand Pivot Transfers 4: Min assist    Stand Pivot Transfer Details (indicate cue type and reason) verbal  and manual cues on proper sequencing and turning of RW    Comments takes incr time with transfers, esp stand pivot with RW      Ambulation/Gait   Ambulation/Gait Yes    Ambulation/Gait Assistance 4: Min guard    Ambulation/Gait Assistance Details w/c follow due to pt fatigueing easily and needing seated rest break. pt needing cues to stay closer to RW and in the middle (as pt with tendency to push too far to the R) and cues for incr step length, esp with LLE    Ambulation Distance (Feet) 81 Feet    Assistive device Rolling walker    Gait Pattern Step-through pattern;Step-to pattern;Decreased stride length;Decreased stance time - left;Decreased weight shift to left;Right foot flat;Left foot flat;Shuffle;Trunk flexed    Ambulation Surface Level;Indoor    Gait velocity 50.53 seconds (in 20 ft) = .40 ft/sec      Therapeutic Activites    Therapeutic Activities Other Therapeutic Activities     Other Therapeutic Activities discussed with pt's spouse current POC and progress towards goals, due to pt maintaining gains from last STG check and overall improvements from initial eval, will extend POC for 2x week for 3 weeks to begin to finalize HEP and to continue working on safety and transfers with RW, pt's spouse and pt in agreement. pt's spouse reporting that pt's energy levels have been better and pt is spending less time sitting around and sleeping since participating in therapy                  PT Education - 05/20/20 1337    Education Details see TA.    Person(s) Educated Patient;Spouse    Methods Explanation    Comprehension Verbalized understanding            PT Short Term Goals - 05/06/20 1157      PT SHORT TERM GOAL #1   Title Pt and wife will be independent with initial HEP in order to build upon functional gains made in therapy. ALL STGS DUE 04/22/20    Baseline reports performing HEP more consistently, however not everyday.    Time 4    Period Weeks    Status Partially Met    Target Date 04/22/20      PT SHORT TERM GOAL #2   Title Pt will undergo further assessment of stairs - LTG written as appropriate.    Baseline unable to assess to to fatigue    Time 4    Period Weeks    Status Deferred      PT SHORT TERM GOAL #3   Title Pt will decr TUG time to 1 minute and 40 seconds with RW in order to decr fall risk.    Baseline 1 minute and 55.9 seconds, 1 minute and 33 seconds on 04/17/20    Time 4    Period Weeks    Status Achieved      PT SHORT TERM GOAL #4   Title Pt will improve gait speed to at least .6 ft/sec with RW in order to decr fall risk.    Baseline .39 ft/sec    Time 4    Period Weeks    Status Revised      PT SHORT TERM GOAL #5   Title Pt will decr 5x sit <> stand time to 55 seconds or less using BUE support from standard height chair in order to improve functional BLE strength.    Baseline 1 minute and 9 seconds, 34.71 seconds  on  04/17/20    Time 4    Period Weeks    Status Achieved             PT Long Term Goals - 05/20/20 1039      PT LONG TERM GOAL #1   Title Pt and wife will be independent with final HEP in order to build upon functional gains made in therapy. ALL LTGS DUE 05/20/20    Time 8    Period Weeks    Status New      PT LONG TERM GOAL #2   Title Stair goal to be written as appropriate for pt to safely enter/exit home.    Time 8    Period Weeks    Status New      PT LONG TERM GOAL #3   Title Pt will decr TUG time to 1 minute and 20 seconds with RW in order to decr fall risk    Baseline 1 minute and 55.9 seconds, 1 minute and 29 seconds with RW    Time 8    Period Weeks    Status Not Met      PT LONG TERM GOAL #4   Title Pt will improve gait speed to at least .8 ft/sec with RW in order to decr fall risk.with RW in order to determine fall risk -  LTG to be written as appropriate.    Baseline .39 ft/sec, 50.53 seconds (in 20 ft) = .40 ft/sec    Time 8    Period Weeks    Status Not Met      PT LONG TERM GOAL #5   Title Pt will decr 5x sit <> stand time to 45 seconds or less using BUE support from standard height chair in order to improve functional BLE strength.    Baseline 1 minute and 9 seconds, 35.56 seconds with BUE support from standard height chair on 05/20/20    Time 8    Period Weeks    Status Achieved          Revised/on-going LTGs for re-cert:     PT Long Term Goals - 05/20/20 1359      PT LONG TERM GOAL #1   Title Pt and wife will be independent with final HEP in order to build upon functional gains made in therapy. ALL LTGS DUE 06/17/20    Time 4    Period Weeks    Status On-going    Target Date 06/17/20      PT LONG TERM GOAL #2   Title Pt will be able to perform 4 stairs with min A with B handrails in order to safely enter/exit home safely.    Time 4    Period Weeks    Status New      PT LONG TERM GOAL #3   Title Pt will decr TUG time to 1 minute and 20  seconds with RW in order to decr fall risk    Baseline 1 minute and 55.9 seconds, 1 minute and 29 seconds with RW    Time 4    Period Weeks    Status On-going      PT LONG TERM GOAL #4   Title Pt will improve gait speed to at least .6 ft/sec with RW in order to decr fall risk.    Baseline .39 ft/sec, 50.53 seconds (in 20 ft) = .40 ft/sec    Time 4    Period Weeks    Status Revised  PT LONG TERM GOAL #5   Title Pt will perform stand pivot transfer with RW with min guard in order to demo improved safety with functional transfers.    Baseline min A to steer RW    Time 4    Period Weeks    Status New                 05/20/20 1357  Plan  Clinical Impression Statement Focus of today's skilled session was beginning to assess pt's LTGs. Pt achieved LTG #5 in regards to 5x sit <> stand, pt able to perform today in 35.56 seconds with BUE support (previously at eval was 1 minute and 9 seconds). Pt did not meet LTG #4, pt's gait speed with RW is .40 ft/sec, which is the same as at eval (.39 ft/sec), putting pt at an incr risk for falls and a household ambulator. Pt almost met LTG in regards to TUG, pt performed today in 1 minute and 29 seconds with RW (previously 1 minute and 55 seconds at eval). Pt's TUG and 5x sit <> stand time are approx. The same from when they were checked for STGs (04/17/20). Pt with no change in gait speed since eval. Pt continues to need min A when performing stand pivot transfers with RW for safety when turning. Pt continues to be wheeled back into clinic in manual chair due to fatigue and for time management. Discussed with wife about extending POC for 2x week for 3 weeks to continue to work on safety with transfers and gait with RW and to finalize HEP for pt to perform at home. Pt's wife and pt in agreement. LTGs revised and on-going as appropriate.  Personal Factors and Comorbidities Behavior Pattern;Comorbidity 3+;Time since onset of  injury/illness/exacerbation;Past/Current Experience;Fitness  Comorbidities PMH: CVA (12/2018)-right thalamic stroke and left frontal punctate infarct, , diabetes mellitus, HTN, lupus, seizure, depression  Examination-Activity Limitations Bathing;Bed Mobility;Locomotion Level;Squat;Transfers;Stand;Stairs  Examination-Participation Restrictions School  Pt will benefit from skilled therapeutic intervention in order to improve on the following deficits Abnormal gait;Decreased balance;Decreased activity tolerance;Decreased coordination;Decreased cognition;Decreased endurance;Decreased knowledge of use of DME;Decreased range of motion;Decreased strength;Difficulty walking;Postural dysfunction  Stability/Clinical Decision Making Evolving/Moderate complexity  Rehab Potential Fair  PT Frequency 2x / week  PT Duration 3 weeks (3-4 weeks)  PT Treatment/Interventions ADLs/Self Care Home Management;DME Instruction;Stair training;Gait training;Functional mobility training;Therapeutic activities;Therapeutic exercise;Balance training;Neuromuscular re-education;Patient/family education;Passive range of motion;Vestibular  PT Next Visit Plan show stationary peddle bike and where to purchase one for home use (pt's spouse was asking), review HEP and update as appropriate. pt is wheeled back into session with clinic w/c and also wheeled out due to pt with slow gait speed and fatigue and for time constraints. gait training with RW, sit <> stand transfers with RW. SciFit.  PT Home Exercise Plan Access Code: [email or web address]  Consulted and Agree with Plan of Care Family member/caregiver;Patient  Family Member Consulted wife, David Irwin     Patient will benefit from skilled therapeutic intervention in order to improve the following deficits and impairments:     Visit Diagnosis: Unsteadiness on feet  Other abnormalities of gait and mobility  Other symptoms and signs involving the nervous system  Muscle weakness  (generalized)     Problem List Patient Active Problem List   Diagnosis Date Noted  . Acute ischemic right MCA stroke ([place]) 12/07/2018  . Stroke ([place]) 12/07/2018  . Moderate dementia without behavioral disturbance ([place]) 07/27/2018  . New onset seizure ([place]) 07/27/2018  . Depression 12/16/2017  .  Essential hypertension 03/26/2017  . Hyperlipidemia 03/26/2017  . Cardiac murmur 03/26/2017  . Mild cognitive impairment, so stated 05/01/2013  . Memory loss 05/01/2013    Arliss Journey, PT, DPT 05/20/2020, 1:41 PM  [facility] [address] [place], [place], [address] Phone: [phone]   Fax:  [phone]  Name: Lester Crickenberger MRN: [id number] Date of Birth: Jan 28, 1946

## 2020-05-22 ENCOUNTER — Encounter: Payer: Self-pay | Admitting: Physical Therapy

## 2020-05-22 ENCOUNTER — Ambulatory Visit: Payer: PPO | Admitting: Physical Therapy

## 2020-05-22 ENCOUNTER — Other Ambulatory Visit: Payer: Self-pay

## 2020-05-22 DIAGNOSIS — M6281 Muscle weakness (generalized): Secondary | ICD-10-CM

## 2020-05-22 DIAGNOSIS — R2681 Unsteadiness on feet: Secondary | ICD-10-CM | POA: Diagnosis not present

## 2020-05-22 DIAGNOSIS — R29818 Other symptoms and signs involving the nervous system: Secondary | ICD-10-CM

## 2020-05-22 DIAGNOSIS — R2689 Other abnormalities of gait and mobility: Secondary | ICD-10-CM

## 2020-05-22 NOTE — Patient Instructions (Addendum)
We will increase Namenda (memantine) to 10mg  twice daily. Please let me knwo if he has any new symptoms.   We will switch to levetiracetam to an extended release tablet. This will allow you to take it once daily. We will continue 1000mg  daily dosing. Monitor for seizures. We may increase dose if needed.   Follow up in 3 months    Memory Compensation Strategies  1. Use "WARM" strategy.  W= write it down  A= associate it  R= repeat it  M= make a mental note  2.   You can keep a .  Use a 3-ring notebook with sections for the following: calendar, important names and phone numbers,  medications, doctors' names/phone numbers, lists/reminders, and a section to journal what you did  each day.   3.    Use a calendar to write appointments down.  4.    Write yourself a schedule for the day.  This can be placed on the calendar or in a separate section of the Memory Notebook.  Keeping a  regular schedule can help memory.  5.    Use medication organizer with sections for each day or morning/evening pills.  You may need help loading it  6.    Keep a basket, or pegboard by the door.  Place items that you need to take out with you in the basket or on the pegboard.  You may also want to  include a message board for reminders.  7.    Use sticky notes.  Place sticky notes with reminders in a place where the task is performed.  For example: " turn off the  stove" placed by the stove, "lock the door" placed on the door at eye level, " take your medications" on  the bathroom mirror or by the place where you normally take your medications.  8.    Use alarms/timers.  Use while cooking to remind yourself to check on food or as a reminder to take your medicine, or as a  reminder to make a call, or as a reminder to perform another task, etc.    Stroke Prevention Some medical conditions and lifestyle choices can lead to a higher risk for a stroke. You can help to prevent a stroke by making  nutrition, lifestyle, and other changes. What nutrition changes can be made?   Eat healthy foods. ? Choose foods that are high in fiber. These include:  Fresh fruits.  Fresh vegetables.  Whole grains. ? Eat at least 5 or more servings of fruits and vegetables each day. Try to fill half of your plate at each meal with fruits and vegetables. ? Choose lean protein foods. These include:  Lowfat (lean) cuts of meat.  Chicken without skin.  Fish.  Tofu.  Beans.  Nuts. ? Eat low-fat dairy products. ? Avoid foods that:  Are high in salt (sodium).  Have saturated fat.  Have trans fat.  Have cholesterol.  Are processed.  Are premade.  Follow eating guidelines as told by your doctor. These may include: ? Reducing how many calories you eat and drink each day. ? Limiting how much salt you eat or drink each day to 1,500 milligrams (mg). ? Using only healthy fats for cooking. These include:  Olive oil.  Canola oil.  Sunflower oil. ? Counting how many carbohydrates you eat and drink each day. What lifestyle changes can be made?  Try to stay at a healthy weight. Talk to your doctor about what a good  weight is for you.  Get at least 30 minutes of moderate physical activity at least 5 days a week. This can include: ? Fast walking. ? Biking. ? Swimming.  Do not use any products that have nicotine or tobacco. This includes cigarettes and e-cigarettes. If you need help quitting, ask your doctor. Avoid being around tobacco smoke in general.  Limit how much alcohol you drink to no more than 1 drink a day for nonpregnant women and 2 drinks a day for men. One drink equals 12 oz of beer, 5 oz of wine, or 1 oz of hard liquor.  Do not use drugs.  Avoid taking birth control pills. Talk to your doctor about the risks of taking birth control pills if: ? You are over 96 years old. ? You smoke. ? You get migraines. ? You have had a blood clot. What other changes can be  made?  Manage your cholesterol. ? It is important to eat a healthy diet. ? If your cholesterol cannot be managed through your diet, you may also need to take medicines. Take medicines as told by your doctor.  Manage your diabetes. ? It is important to eat a healthy diet and to exercise regularly. ? If your blood sugar cannot be managed through diet and exercise, you may need to take medicines. Take medicines as told by your doctor.  Control your high blood pressure (hypertension). ? Try to keep your blood pressure below 130/80. This can help lower your risk of stroke. ? It is important to eat a healthy diet and to exercise regularly. ? If your blood pressure cannot be managed through diet and exercise, you may need to take medicines. Take medicines as told by your doctor. ? Ask your doctor if you should check your blood pressure at home. ? Have your blood pressure checked every year. Do this even if your blood pressure is normal.  Talk to your doctor about getting checked for a sleep disorder. Signs of this can include: ? Snoring a lot. ? Feeling very tired.  Take over-the-counter and prescription medicines only as told by your doctor. These may include aspirin or blood thinners (antiplatelets or anticoagulants).  Make sure that any other medical conditions you have are managed. Where to find more information  American Stroke Association: www.strokeassociation.org  National Stroke Association: www.stroke.org Get help right away if:  You have any symptoms of stroke. "BE FAST" is an easy way to remember the main warning signs: ? B - Balance. Signs are dizziness, sudden trouble walking, or loss of balance. ? E - Eyes. Signs are trouble seeing or a sudden change in how you see. ? F - Face. Signs are sudden weakness or loss of feeling of the face, or the face or eyelid drooping on one side. ? A - Arms. Signs are weakness or loss of feeling in an arm. This happens suddenly and usually on  one side of the body. ? S - Speech. Signs are sudden trouble speaking, slurred speech, or trouble understanding what people say. ? T - Time. Time to call emergency services. Write down what time symptoms started.  You have other signs of stroke, such as: ? A sudden, very bad headache with no known cause. ? Feeling sick to your stomach (nausea). ? Throwing up (vomiting). ? Jerky movements you cannot control (seizure). These symptoms may represent a serious problem that is an emergency. Do not wait to see if the symptoms will go away. Get medical help right away.  Call your local emergency services (911 in the [place] Do not drive yourself to the hospital. Summary  You can prevent a stroke by eating healthy, exercising, not smoking, drinking less alcohol, and treating other health problems, such as diabetes, high blood pressure, or high cholesterol.  Do not use any products that contain nicotine or tobacco, such as cigarettes and e-cigarettes.  Get help right away if you have any signs or symptoms of a stroke. This information is not intended to replace advice given to you by your health care provider. Make sure you discuss any questions you have with your health care provider. Document Revised: 11/17/2018 Document Reviewed: 12/23/2016 Elsevier Patient Education  2020 [employer].    Fall Prevention in the Home, Adult Falls can cause injuries. They can happen to people of all ages. There are many things you can do to make your home safe and to help prevent falls. Ask for help when making these changes, if needed. What actions can I take to prevent falls? General Instructions  Use good lighting in all rooms. Replace any light bulbs that burn out.  Turn on the lights when you go into a dark area. Use night-lights.  Keep items that you use often in easy-to-reach places. Lower the shelves around your home if necessary.  Set up your furniture so you have a clear path. Avoid moving your  furniture around.  Do not have throw rugs and other things on the floor that can make you trip.  Avoid walking on wet floors.  If any of your floors are uneven, fix them.  Add color or contrast paint or tape to clearly mark and help you see: ? Any grab bars or handrails. ? First and last steps of stairways. ? Where the edge of each step is.  If you use a stepladder: ? Make sure that it is fully opened. Do not climb a closed stepladder. ? Make sure that both sides of the stepladder are locked into place. ? Ask someone to hold the stepladder for you while you use it.  If there are any pets around you, be aware of where they are. What can I do in the bathroom?      Keep the floor dry. Clean up any water that spills onto the floor as soon as it happens.  Remove soap buildup in the tub or shower regularly.  Use non-skid mats or decals on the floor of the tub or shower.  Attach bath mats securely with double-sided, non-slip rug tape.  If you need to sit down in the shower, use a plastic, non-slip stool.  Install grab bars by the toilet and in the tub and shower. Do not use towel bars as grab bars. What can I do in the bedroom?  Make sure that you have a light by your bed that is easy to reach.  Do not use any sheets or blankets that are too big for your bed. They should not hang down onto the floor.  Have a firm chair that has side arms. You can use this for support while you get dressed. What can I do in the kitchen?  Clean up any spills right away.  If you need to reach something above you, use a strong step stool that has a grab bar.  Keep electrical cords out of the way.  Do not use floor polish or wax that makes floors slippery. If you must use wax, use non-skid floor wax. What can I do with  my stairs?  Do not leave any items on the stairs.  Make sure that you have a light switch at the top of the stairs and the bottom of the stairs. If you do not have them, ask  someone to add them for you.  Make sure that there are handrails on both sides of the stairs, and use them. Fix handrails that are broken or loose. Make sure that handrails are as long as the stairways.  Install non-slip stair treads on all stairs in your home.  Avoid having throw rugs at the top or bottom of the stairs. If you do have throw rugs, attach them to the floor with carpet tape.  Choose a carpet that does not hide the edge of the steps on the stairway.  Check any carpeting to make sure that it is firmly attached to the stairs. Fix any carpet that is loose or worn. What can I do on the outside of my home?  Use bright outdoor lighting.  Regularly fix the edges of walkways and driveways and fix any cracks.  Remove anything that might make you trip as you walk through a door, such as a raised step or threshold.  Trim any bushes or trees on the path to your home.  Regularly check to see if handrails are loose or broken. Make sure that both sides of any steps have handrails.  Install guardrails along the edges of any raised decks and porches.  Clear walking paths of anything that might make someone trip, such as tools or rocks.  Have any leaves, snow, or ice cleared regularly.  Use sand or salt on walking paths during winter.  Clean up any spills in your garage right away. This includes grease or oil spills. What other actions can I take?  Wear shoes that: ? Have a low heel. Do not wear high heels. ? Have rubber bottoms. ? Are comfortable and fit you well. ? Are closed at the toe. Do not wear open-toe sandals.  Use tools that help you move around (mobility aids) if they are needed. These include: ? Canes. ? Walkers. ? Scooters. ? Crutches.  Review your medicines with your doctor. Some medicines can make you feel dizzy. This can increase your chance of falling. Ask your doctor what other things you can do to help prevent falls. Where to find more  information  Centers for Disease Control and Prevention, STEADI: HealthcareCounselor.com.pthttps://cdc.gov  [employer] Aging: RingConnections.sihttps://go4life.nia.nih.gov Contact a doctor if:  You are afraid of falling at home.  You feel weak, drowsy, or dizzy at home.  You fall at home. Summary  There are many simple things that you can do to make your home safe and to help prevent falls.  Ways to make your home safe include removing tripping hazards and installing grab bars in the bathroom.  Ask for help when making these changes in your home. This information is not intended to replace advice given to you by your health care provider. Make sure you discuss any questions you have with your health care provider. Document Revised: 01/12/2019 Document Reviewed: 05/06/2017 Elsevier Patient Education  2020 [employer]   Seizure, Adult A seizure is a sudden burst of abnormal electrical activity in the brain. Seizures usually last from 30 seconds to 2 minutes. They can cause many different symptoms. Usually, seizures are not harmful unless they last a long time. What are the causes? Common causes of this condition include:  Fever or infection.  Conditions that  affect the brain, such as: ? A brain abnormality that you were born with. ? A brain or head injury. ? Bleeding in the brain. ? A tumor. ? Stroke. ? Brain disorders such as autism or cerebral palsy.  Low blood sugar.  Conditions that are passed from parent to child (are inherited).  Problems with substances, such as: ? Having a reaction to a drug or a medicine. ? Suddenly stopping the use of a substance (withdrawal). In some cases, the cause may not be known. A person who has repeated seizures over time without a clear cause has a condition called epilepsy. What increases the risk? You are more likely to get this condition if you have:  A family history of epilepsy.  Had a seizure in the past.  A brain disorder.  A history of head injury,  lack of oxygen at birth, or strokes. What are the signs or symptoms? There are many types of seizures. The symptoms vary depending on the type of seizure you have. Examples of symptoms during a seizure include:  Shaking (convulsions).  Stiffness in the body.  Passing out (losing consciousness).  Head nodding.  Staring.  Not responding to sound or touch.  Loss of bladder control and bowel control. Some people have symptoms right before and right after a seizure happens. Symptoms before a seizure may include:  Fear.  Worry (anxiety).  Feeling like you may vomit (nauseous).  Feeling like the room is spinning (vertigo).  Feeling like you saw or heard something before (dj vu).  Odd tastes or smells.  Changes in how you see. You may see flashing lights or spots. Symptoms after a seizure happens can include:  Confusion.  Sleepiness.  Headache.  Weakness on one side of the body. How is this treated? Most seizures will stop on their own in under 5 minutes. In these cases, no treatment is needed. Seizures that last longer than 5 minutes will usually need treatment. Treatment can include:  Medicines given through an IV tube.  Avoiding things that are known to cause your seizures. These can include medicines that you take for another condition.  Medicines to treat epilepsy.  Surgery to stop the seizures. This may be needed if medicines do not help. Follow these instructions at home: Medicines  Take over-the-counter and prescription medicines only as told by your doctor.  Do not eat or drink anything that may keep your medicine from working, such as alcohol. Activity  Do not do any activities that would be dangerous if you had another seizure, like driving or swimming. Wait until your doctor says it is safe for you to do them.  If you live in the [place] ask your local DMV (department of motor vehicles) when you can drive.  Get plenty of rest. Teaching  others Teach friends and family what to do when you have a seizure. They should:  Lay you on the ground.  Protect your head and body.  Loosen any tight clothing around your neck.  Turn you on your side.  Not hold you down.  Not put anything into your mouth.  Know whether or not you need emergency care.  Stay with you until you are better.  General instructions  Contact your doctor each time you have a seizure.  Avoid anything that gives you seizures.  Keep a seizure diary. Write down: ? What you think caused each seizure. ? What you remember about each seizure.  Keep all follow-up visits as told by your doctor.  This is important. Contact a doctor if:  You have another seizure.  You have seizures more often.  There is any change in what happens during your seizures.  You keep having seizures with treatment.  You have symptoms of being sick or having an infection. Get help right away if:  You have a seizure that: ? Lasts longer than 5 minutes. ? Is different than seizures you had before. ? Makes it harder to breathe. ? Happens after you hurt your head.  You have any of these symptoms after a seizure: ? Not being able to speak. ? Not being able to use a part of your body. ? Confusion. ? A bad headache.  You have two or more seizures in a row.  You do not wake up right after a seizure.  You get hurt during a seizure. These symptoms may be an emergency. Do not wait to see if the symptoms will go away. Get medical help right away. Call your local emergency services (911 in the [place] Do not drive yourself to the hospital. Summary  Seizures usually last from 30 seconds to 2 minutes. Usually, they are not harmful unless they last a long time.  Do not eat or drink anything that may keep your medicine from working, such as alcohol.  Teach friends and family what to do when you have a seizure.  Contact your doctor each time you have a seizure. This  information is not intended to replace advice given to you by your health care provider. Make sure you discuss any questions you have with your health care provider. Document Revised: 12/09/2018 Document Reviewed: 12/09/2018 Elsevier Patient Education  2020 [employer].

## 2020-05-22 NOTE — Therapy (Signed)
[facility] [address] [place], [place], [address] Phone: [phone]   Fax:  [phone]  Physical Therapy Treatment  Patient Details  Name: David Irwin MRN: [id number] Date of Birth: 24-Dec-1945 Referring Provider (PT): Shawnie Dapper, NP   Encounter Date: 05/22/2020   PT End of Session - 05/22/20 1026    Visit Number 14    Number of Visits 20    Date for PT Re-Evaluation 07/19/20   extending POC for additional 2x week for 3-4 weeks   Authorization Type [employer] PPO - $15 co pay    PT Start Time 1018    PT Stop Time 1100    PT Time Calculation (min) 42 min    Equipment Utilized During Treatment Gait belt    Activity Tolerance Patient tolerated treatment well;Patient limited by fatigue    Behavior During Therapy [facility] for tasks assessed/performed;Flat affect           Past Medical History:  Diagnosis Date   CVA (cerebral vascular accident) (HCC) 12/07/2018   Diabetes mellitus without complication (HCC)    Hypertension    Lupus (HCC)    Memory loss    Murmur, cardiac    Nocturia    Seizure (HCC)     Past Surgical History:  Procedure Laterality Date   APPENDECTOMY     EYE SURGERY      There were no vitals filed for this visit.   Subjective Assessment - 05/22/20 1025    Subjective No new complaints. No falls to report. Some left arm soreness, not able to rate it. Spouse reports he is moving a little slowly today due to a seizure last night. See's the Neurlogist tomorrow. Is currently on seizure meds. Previous seizure was over a month ago.    Patient is accompained by: Family member   spouse [name]   Pertinent History CVA (12/2018)-right thalamic stroke and left frontal punctate infarct, , diabetes mellitus, HTN, lupus, seizure, depression    Patient Stated Goals "wants a normal life", wants to walk normal    Currently in Pain? No/denies    Pain Score 0-No pain                 [facility] Adult PT Treatment/Exercise - 05/22/20 1026      Transfers   Transfers Stand to Sit;Sit to Stand    Sit to Stand 4: Min guard;With upper extremity assist;From chair/3-in-1    Sit to Stand Details Verbal cues for sequencing;Verbal cues for technique;Verbal cues for precautions/safety;Verbal cues for safe use of DME/AE    Sit to Stand Details (indicate cue type and reason) cues needed to scoot to edge of surface and for hand placement with standing    Stand to Sit 4: Min guard;With upper extremity assist;To chair/3-in-1    Stand to Sit Details (indicate cue type and reason) Verbal cues for sequencing;Verbal cues for technique;Verbal cues for precautions/safety;Verbal cues for safe use of DME/AE    Stand to Sit Details cues to reach back once pt is at surface to sit down      Ambulation/Gait   Ambulation/Gait Yes    Ambulation/Gait Assistance 4: Min guard    Ambulation/Gait Assistance Details cues for posture, increased step length and to stay closer to RW with gait.     Ambulation Distance (Feet) 40 Feet   x1   Assistive device Rolling walker    Gait Pattern Step-through pattern;Step-to pattern;Decreased stride length;Decreased stance time - left;Decreased weight shift to  left;Right foot flat;Left foot flat;Shuffle;Trunk flexed    Ambulation Surface Level;Indoor      Self-Care   Self-Care Other Self-Care Comments    Other Self-Care Comments  educated on use of floor bikes at home for continued strengthening and activity tolearnce. Had pt try several models in session with better performance noted with stepper style vs circle/bike style as pt had trouble getting full revolutions with this one. Pt's spouse plans to look around to see what she can find.       Knee/Hip Exercises: Aerobic   Other Aerobic Scifit with UE/LE's on level 2.0 for 8 minutes with goal >/= 40 rpm for strengthening and activity tolerance               PT Short Term Goals - 05/20/20 1358      PT SHORT TERM  GOAL #1   Title ALL STGS = LTGS             PT Long Term Goals - 05/20/20 1359      PT LONG TERM GOAL #1   Title Pt and wife will be independent with final HEP in order to build upon functional gains made in therapy. ALL LTGS DUE 06/17/20    Time 4    Period Weeks    Status On-going    Target Date 06/17/20      PT LONG TERM GOAL #2   Title Pt will be able to perform 4 stairs with min A with B handrails in order to safely enter/exit home safely.    Time 4    Period Weeks    Status New      PT LONG TERM GOAL #3   Title Pt will decr TUG time to 1 minute and 20 seconds with RW in order to decr fall risk    Baseline 1 minute and 55.9 seconds, 1 minute and 29 seconds with RW    Time 4    Period Weeks    Status On-going      PT LONG TERM GOAL #4   Title Pt will improve gait speed to at least .6 ft/sec with RW in order to decr fall risk.    Baseline .39 ft/sec, 50.53 seconds (in 20 ft) = .40 ft/sec    Time 4    Period Weeks    Status Revised      PT LONG TERM GOAL #5   Title Pt will perform stand pivot transfer with RW with min guard in order to demo improved safety with functional transfers.    Baseline min A to steer RW    Time 4    Period Weeks    Status New                 Plan - 05/22/20 1026    Clinical Impression Statement Today's skilled session initially focused on education on various styles of floor bikes with pt practicing with them in session. Pt did best with stepper ones as he had trouble getting the cycle one around in a circle. Pt's spouse to look into getting one of these for pt to use at home. Remainder of session focused on strengthening, activity tolerance and gait with RW. The pt is making steady progress toward goals and should benefit from continued PT to progress toward unmet goals.    Personal Factors and Comorbidities Behavior Pattern;Comorbidity 3+;Time since onset of injury/illness/exacerbation;Past/Current Experience;Fitness     Comorbidities PMH: CVA (12/2018)-right thalamic stroke and left frontal punctate infarct, ,  diabetes mellitus, HTN, lupus, seizure, depression    Examination-Activity Limitations Bathing;Bed Mobility;Locomotion Level;Squat;Transfers;Stand;Stairs    Examination-Participation Restrictions School    Stability/Clinical Decision Making Evolving/Moderate complexity    Rehab Potential Fair    PT Frequency 2x / week    PT Duration 3 weeks   3-4 weeks   PT Treatment/Interventions ADLs/Self Care Home Management;DME Instruction;Stair training;Gait training;Functional mobility training;Therapeutic activities;Therapeutic exercise;Balance training;Neuromuscular re-education;Patient/family education;Passive range of motion;Vestibular    PT Next Visit Plan review HEP and update as appropriate. pt is wheeled back into session with clinic w/c and also wheeled out due to pt with slow gait speed and fatigue and for time constraints. gait training with RW, sit <> stand transfers with RW. [employer].    PT Home Exercise Plan Access Code: [email or web address]    Consulted and Agree with Plan of Care Family member/caregiver;Patient    Family Member Consulted wife, [name]           Patient will benefit from skilled therapeutic intervention in order to improve the following deficits and impairments:  Abnormal gait, Decreased balance, Decreased activity tolerance, Decreased coordination, Decreased cognition, Decreased endurance, Decreased knowledge of use of DME, Decreased range of motion, Decreased strength, Difficulty walking, Postural dysfunction  Visit Diagnosis: Unsteadiness on feet  Other abnormalities of gait and mobility  Other symptoms and signs involving the nervous system  Muscle weakness (generalized)     Problem List Patient Active Problem List   Diagnosis Date Noted   Acute ischemic right MCA stroke (HCC) 12/07/2018   Stroke (HCC) 12/07/2018   Moderate dementia without behavioral disturbance (HCC) 07/27/2018    New onset seizure (HCC) 07/27/2018   Depression 12/16/2017   Essential hypertension 03/26/2017   Hyperlipidemia 03/26/2017   Cardiac murmur 03/26/2017   Mild cognitive impairment, so stated 05/01/2013   Memory loss 05/01/2013    Sallyanne Kuster, PTA, [facility] [address], [address] [place], [place] [address] [phone] 05/23/20, 7:47 AM Name: Siddh Vandeventer MRN: [id number] Date of Birth: 11/21/1945

## 2020-05-22 NOTE — Progress Notes (Signed)
PATIENT: David BoozeGeorge Irwin DOB: 10/03/1946  REASON FOR VISIT: follow up HISTORY FROM: patient  Chief Complaint  Patient presents with  . Follow-up    rm 1 here for f/u on seizures and dementia. Pt is here with his wife      HISTORY OF PRESENT ILLNESS: Today 05/23/20 David Irwin is a 74 y.o. male here today for follow up for seizures and memory loss. His wife presents with him today and aids in history. She reports that he has had two seizures since last being seen in 02/2020. She reports one event on 7/21 and another on 8/17 where he was unresponsive and starring off. She reports that he was very tired following event and reported feeling like he was floating. No tongue injury, incontinence or shaking/jerking. She reports that he is eating and drinking well. She denies missed doses of medication.  Mrs. David Irwin does report that timing of doses are inconsistent.  It depends on what time he wakes up.  She feels memory is about the same. No changes. He is tolerating Namenda 5mg  BID.   He is participating in PT. Last note reports that therapy was extended as he had made improvement but had not yet reached his HEP goals. Wife feels that he is more active at home since he has been in PT. He continues close follow up with PCP for stroke prevention.    HISTORY: (copied from my note on 02/15/2020)  David BoozeGeorge Trusty is a 74 y.o. male here today for follow up for seizures and memory. He continues levetiracetam 500mg  BID. No recent seizures. He is also taking Namenda 5mg  BID. He has not noted any benefit. Increased doses caused sleepiness. His wife hasn't noted any significant improvements. She feels that she is having to help him more with getting bathed and dressed. He has dentures and his wife usually cleans those. He has more difficulty standing. He reports that he feels weaker. He has difficulty standing for long periods of time . His wife feels that he is shuffling his feet. He has been slower in  movements. No tremor. He is using his walker at home. Some days he can walk without it. No falls. Appetites is good. He has stopped drinking as much coffee and eating peanut butter sandwiches. He has lost 13 pounds in 8 months following diet changes. He does not like to drink water.   He is s/p right thalamic stroke and left frontal punctate infarct, 12/2018. He continues close follow up with PCP. He continues Plavix daily. BP has been good. On simvastatin 20mg . DM is well controlled. Last A1C 7. He feels that mood is fairly stable. He is quite and withdrawn in the office today. His wife reports that he used to be very active but since his stroke he has been sedentary and withdrawn. She feels that the pandemic has caused worsening. He does seem much more alert and energized when he gets to see friends or family. He was taking Zoloft in 2019 but was not well tolerated.   HISTORY: (copied from my note on 07/03/2019)  David Irwin a 74 y.o.malehere today for follow up of seizure and memory loss. He continues levetiracetam500 mg twice daily. No seizure activity noted. Occasionally he will have a short episode of staring but uncertain if this is seizure. This has occurred approximately 3 times since last visit. No tonic-clonic movements. No incontinence or tongue injury. His wife reports that memory continues to decline. He was last seen by Dr. Marjory Irwin  and advised to consider Namenda. He does not drive. He is able to perform ADLs at home independently. His wife reports that he is more sleepy. He is eating well. No falls. He does walk with a walker. He continues close follow-up with primary care. No strokelike symptoms since last being seen in April. He continues simvastatin 20 mg as well as Plavix daily.  HISTORY: (copied fromDr Penumalli'snote on 01/23/2019)  - here for eval of new right thalamic stroke (March 2020);patient admitted to the hospital in March 2020 for left-sided  weakness. - walking has improved; speech improving - tolerating meds - no seizures - memory loss stable    REVIEW OF SYSTEMS: Out of a complete 14 system review of symptoms, the patient complains only of the following symptoms, seizures, imbalance, weakness and all other reviewed systems are negative.   ALLERGIES: Allergies  Allergen Reactions  . Liraglutide Other (See Comments)    hallucinations   . Shellfish Allergy Hives    HOME MEDICATIONS: Outpatient Medications Prior to Visit  Medication Sig Dispense Refill  . acetaminophen (TYLENOL) 500 MG tablet Take 500 mg by mouth every 6 (six) hours as needed for headache (pain).    . clobetasol cream (TEMOVATE) 0.05 % Apply 1 application topically as needed (break out lupus reaction on scalp).    . clopidogrel (PLAVIX) 75 MG tablet Take 1 tablet (75 mg total) by mouth daily. 30 tablet 11  . fluticasone (FLONASE) 50 MCG/ACT nasal spray Place 2 sprays into both nostrils at bedtime as needed for allergies or rhinitis.   5  . glimepiride (AMARYL) 2 MG tablet Take 2 mg by mouth daily with breakfast.    . hydroxychloroquine (PLAQUENIL) 200 MG tablet Take 200 mg by mouth 2 (two) times daily.     . Multiple Vitamins-Minerals (ALIVE MENS ENERGY PO) Take 1 tablet by mouth daily. Gummie    . polyvinyl alcohol (ARTIFICIAL TEARS) 1.4 % ophthalmic solution Place 1 drop into both eyes daily as needed for dry eyes.    . potassium chloride SA (K-DUR,KLOR-CON) 20 MEQ tablet Take 20 mEq by mouth daily.     Marland Kitchen PRESCRIPTION MEDICATION Inhale into the lungs at bedtime. CPAP    . simvastatin (ZOCOR) 20 MG tablet Take 20 mg by mouth at bedtime.     . tamsulosin (FLOMAX) 0.4 MG CAPS Take 0.4 mg by mouth daily.     Marland Kitchen telmisartan-hydrochlorothiazide (MICARDIS HCT) 40-12.5 MG tablet Take 2 tablets by mouth daily.  4  . vitamin B-12 (CYANOCOBALAMIN) 1000 MCG tablet Take 1,000 mcg by mouth daily.    Marland Kitchen levETIRAcetam (KEPPRA) 500 MG tablet Take 1 tablet (500 mg  total) by mouth 2 (two) times daily. 180 tablet 1  . memantine (NAMENDA) 5 MG tablet Take 1 tablet (5 mg total) by mouth 2 (two) times daily. 60 tablet 6  . omeprazole (PRILOSEC) 20 MG capsule Take 1 capsule (20 mg total) by mouth daily. 30 capsule 1   No facility-administered medications prior to visit.    PAST MEDICAL HISTORY: Past Medical History:  Diagnosis Date  . CVA (cerebral vascular accident) (HCC) 12/07/2018  . Diabetes mellitus without complication (HCC)   . Hypertension   . Lupus (HCC)   . Memory loss   . Murmur, cardiac   . Nocturia   . Seizure (HCC)     PAST SURGICAL HISTORY: Past Surgical History:  Procedure Laterality Date  . APPENDECTOMY    . EYE SURGERY      FAMILY HISTORY: Family  History  Problem Relation Age of Onset  . Kidney disease Mother   . Dementia Father     SOCIAL HISTORY: Social History   Socioeconomic History  . Marital status: Married    Spouse name: [name]  . Number of children: 2  . Years of education: 12th  . Highest education level: Not on file  Occupational History  . Occupation: Retired  Tobacco Use  . Smoking status: Never Smoker  . Smokeless tobacco: Never Used  Vaping Use  . Vaping Use: Never used  Substance and Sexual Activity  . Alcohol use: No  . Drug use: No  . Sexual activity: Not on file  Other Topics Concern  . Not on file  Social History Narrative   Patient lives at home with spouse.   Caffeine Use: 1-2 cups daily   12th grade   Social Determinants of Health   Financial Resource Strain:   . Difficulty of Paying Living Expenses: Not on file  Food Insecurity:   . Worried About Programme researcher, broadcasting/film/video in the Last Year: Not on file  . Ran Out of Food in the Last Year: Not on file  Transportation Needs:   . Lack of Transportation (Medical): Not on file  . Lack of Transportation (Non-Medical): Not on file  Physical Activity:   . Days of Exercise per Week: Not on file  . Minutes of Exercise per Session: Not  on file  Stress:   . Feeling of Stress : Not on file  Social Connections:   . Frequency of Communication with Friends and Family: Not on file  . Frequency of Social Gatherings with Friends and Family: Not on file  . Attends Religious Services: Not on file  . Active Member of Clubs or Organizations: Not on file  . Attends Banker Meetings: Not on file  . Marital Status: Not on file  Intimate Partner Violence:   . Fear of Current or Ex-Partner: Not on file  . Emotionally Abused: Not on file  . Physically Abused: Not on file  . Sexually Abused: Not on file      PHYSICAL EXAM  Vitals:   05/23/20 1112  BP: 135/70  Pulse: 68  Weight: 202 lb (91.6 kg)  Height:  (1.753 m)   Body mass index is 29.83 kg/m.  Generalized: Well developed, in no acute distress  Cardiology: normal rate and rhythm, no murmur noted Respiratory: clear to auscultation bilaterally  Neurological examination  Mentation: Alert, not oriented to time, he is oriented to place, and some history taking. Follows all commands speech and language fluent Cranial nerve II-XII: Pupils were equal round reactive to light. Extraocular movements were full, visual field were full  Motor: The motor testing reveals 5 over 5 strength of upper extremities, 4-4+/5 bilateral lower extremities. Good symmetric motor tone is noted throughout.  Gait and station: Gait into assess today   DIAGNOSTIC DATA (LABS, IMAGING, TESTING) - I reviewed patient records, labs, notes, testing and imaging myself where available.  MMSE - Mini Mental State Exam 05/23/2020 07/03/2019 07/27/2018  Not completed: - (No Data) -  Orientation to time 1 0 2  Orientation to Place Registration Attention/ Calculation Recall 0 1 0  Language- name 2 objects Language- repeat Language- follow 3 step command Language- follow 3 step command-comments - he grabbed the paper with his left  hand. -  Language-  read & follow direction 1 1 1   Write a sentence 0 1 0  Write a sentence-comments - - no subject  Copy design 0 0 0  Total score 15 14 17      Lab Results  Component Value Date   WBC 3.6 (L) 12/07/2018   HGB 12.3 (L) 12/07/2018   HCT 38.1 (L) 12/07/2018   MCV 96.5 12/07/2018   PLT 129 (L) 12/07/2018      Component Value Date/Time   NA 138 12/07/2018 2327   K 4.1 12/07/2018 2327   CL 106 12/07/2018 2327   CO2 25 12/07/2018 2327   GLUCOSE 190 (H) 12/07/2018 2327   BUN 20 12/07/2018 2327   CREATININE 1.51 (H) 12/07/2018 2327   CALCIUM 9.0 12/07/2018 2327   PROT 8.1 12/07/2018 1215   ALBUMIN 4.2 12/07/2018 1215   AST 23 12/07/2018 1215   ALT 19 12/07/2018 1215   ALKPHOS 55 12/07/2018 1215   BILITOT 0.9 12/07/2018 1215   GFRNONAA 46 (L) 12/07/2018 2327   GFRAA 53 (L) 12/07/2018 2327   Lab Results  Component Value Date   CHOL 119 12/08/2018   HDL 34 (L) 12/08/2018   LDLCALC 74 12/08/2018   TRIG 54 12/08/2018   CHOLHDL 3.5 12/08/2018   Lab Results  Component Value Date   HGBA1C 8.5 (H) 12/08/2018   No results found for: VITAMINB12 No results found for: TSH     ASSESSMENT AND PLAN 74 y.o. year old male  has a past medical history of CVA (cerebral vascular accident) (HCC) (12/07/2018), Diabetes mellitus without complication (HCC), Hypertension, Lupus (HCC), Memory loss, Murmur, cardiac, Nocturia, and Seizure (HCC). here with     ICD-10-CM   1. Seizure disorder (HCC)  G40.909   2. Moderate dementia without behavioral disturbance (HCC)  F03.90   3. Right thalamic stroke (HCC)  I63.9   4. Gait difficulty  R26.9   5. General weakness  R53.1   6. Depression, unspecified depression type  F32.9     Overall, David Irwin seems to be doing a little bit better.  He has been more active since participating in physical therapy.  I have encouraged him to continue with therapy as advised.  Memory is fairly stable.  He has tolerated Namenda.  I will increase dose to 10 mg twice  daily.  There have been some concerns of breakthrough seizures.  I am concerned that inconsistent timing could be cause.  I have advised Mrs. Irwin to discontinue levetiracetam 500 mg twice daily and we will start levetiracetam XR 1000 mg daily.  I have educated her on the importance of giving this medication at the same time every day.  We have reviewed recommendations for adequate hydration and well-balanced diet.  Fall and seizure precautions reviewed.  I will have him come back to see me in 3 months, sooner if needed.  David Irwin both verbalized understanding and agreement with this plan.   No orders of the defined types were placed in this encounter.    Meds ordered this encounter  Medications  . levETIRAcetam (KEPPRA XR) 500 MG 24 hr tablet    Sig: Take 2 tablets (1,000 mg total) by mouth daily.    Dispense:  180 tablet    Refill:  3    Order Specific Question:   Supervising Provider    Answer:   David Gold David Gold  . memantine (NAMENDA) 10 MG tablet    Sig: Take 1 tablet (  10 mg total) by mouth 2 (two) times daily.    Dispense:  180 tablet    Refill:  3    Order Specific Question:   Supervising Provider    Answer:   Anson Fret [id number]      I spent 45 minutes with the patient. 50% of this time was spent counseling and educating patient on plan of care and medications.    Shawnie Dapper, FNP-C 05/23/2020, 2:06 PM [facility] [address] [place], [place] [address] [phone]

## 2020-05-23 ENCOUNTER — Ambulatory Visit: Payer: PPO | Admitting: Family Medicine

## 2020-05-23 ENCOUNTER — Encounter: Payer: Self-pay | Admitting: Family Medicine

## 2020-05-23 VITALS — BP 135/70 | HR 68 | Ht 69.0 in | Wt 202.0 lb

## 2020-05-23 DIAGNOSIS — R531 Weakness: Secondary | ICD-10-CM | POA: Diagnosis not present

## 2020-05-23 DIAGNOSIS — I639 Cerebral infarction, unspecified: Secondary | ICD-10-CM | POA: Diagnosis not present

## 2020-05-23 DIAGNOSIS — F32A Depression, unspecified: Secondary | ICD-10-CM

## 2020-05-23 DIAGNOSIS — R269 Unspecified abnormalities of gait and mobility: Secondary | ICD-10-CM | POA: Diagnosis not present

## 2020-05-23 DIAGNOSIS — I6381 Other cerebral infarction due to occlusion or stenosis of small artery: Secondary | ICD-10-CM

## 2020-05-23 DIAGNOSIS — F329 Major depressive disorder, single episode, unspecified: Secondary | ICD-10-CM

## 2020-05-23 DIAGNOSIS — F03B0 Unspecified dementia, moderate, without behavioral disturbance, psychotic disturbance, mood disturbance, and anxiety: Secondary | ICD-10-CM

## 2020-05-23 DIAGNOSIS — G40909 Epilepsy, unspecified, not intractable, without status epilepticus: Secondary | ICD-10-CM

## 2020-05-23 DIAGNOSIS — F0390 Unspecified dementia without behavioral disturbance: Secondary | ICD-10-CM | POA: Diagnosis not present

## 2020-05-23 MED ORDER — MEMANTINE HCL 10 MG PO TABS: 10.0000 mg | ORAL_TABLET | Freq: Two times a day (BID) | ORAL | 3 refills | Status: DC

## 2020-05-23 MED ORDER — LEVETIRACETAM ER 500 MG PO TB24: 1000.0000 mg | ORAL_TABLET | Freq: Every day | ORAL | 3 refills | Status: DC

## 2020-05-27 ENCOUNTER — Ambulatory Visit: Payer: PPO | Admitting: Physical Therapy

## 2020-05-27 ENCOUNTER — Other Ambulatory Visit: Payer: Self-pay

## 2020-05-27 VITALS — BP 147/75 | HR 73

## 2020-05-27 DIAGNOSIS — R2681 Unsteadiness on feet: Secondary | ICD-10-CM

## 2020-05-27 DIAGNOSIS — M6281 Muscle weakness (generalized): Secondary | ICD-10-CM

## 2020-05-27 DIAGNOSIS — R29818 Other symptoms and signs involving the nervous system: Secondary | ICD-10-CM

## 2020-05-27 DIAGNOSIS — R2689 Other abnormalities of gait and mobility: Secondary | ICD-10-CM

## 2020-05-27 NOTE — Therapy (Signed)
[facility] [address] [place], [place], [address] Phone: [phone]   Fax:  [phone]  Physical Therapy Treatment  Patient Details  Name: David Irwin MRN: [id number] Date of Birth: 1946/01/05 Referring Provider (PT): Shawnie Dapper, NP   Encounter Date: 05/27/2020   PT End of Session - 05/27/20 1208    Visit Number 15    Number of Visits 20    Date for PT Re-Evaluation 07/19/20   extending POC for additional 2x week for 3-4 weeks   Authorization Type [employer] PPO - $15 co pay    PT Start Time 1023    PT Stop Time 1100    PT Time Calculation (min) 37 min    Equipment Utilized During Treatment Gait belt    Activity Tolerance Patient limited by fatigue   feeling more slow today   Behavior During Therapy [facility] for tasks assessed/performed;Flat affect           Past Medical History:  Diagnosis Date  . CVA (cerebral vascular accident) (HCC) 12/07/2018  . Diabetes mellitus without complication (HCC)   . Hypertension   . Lupus (HCC)   . Memory loss   . Murmur, cardiac   . Nocturia   . Seizure [facility])     Past Surgical History:  Procedure Laterality Date  . APPENDECTOMY    . EYE SURGERY      Vitals:   05/27/20 1031  BP: (!) 147/75  Pulse: 73     Subjective Assessment - 05/27/20 1033    Subjective Saw the neurologist - states that she is pleased with his progress in PT. Wife also mentioned that they mentioned getting a transport chair for the patient. Pt feeling more slow today - did not eat anything this morning and did not have his BP checked.    Patient is accompained by: Family member   spouse [name]   Pertinent History CVA (12/2018)-right thalamic stroke and left frontal punctate infarct, , diabetes mellitus, HTN, lupus, seizure, depression    Patient Stated Goals "wants a normal life", wants to walk normal    Currently in Pain? No/denies                             [facility]  Adult PT Treatment/Exercise - 05/27/20 0001      Transfers   Transfers Stand to Sit;Sit to Stand    Sit to Stand 4: Min guard    Sit to Stand Details Verbal cues for sequencing;Verbal cues for technique;Verbal cues for precautions/safety;Verbal cues for safe use of DME/AE    Sit to Stand Details (indicate cue type and reason) cues to scoot towards edge prior to standing     Stand to Sit 4: Min guard;With upper extremity assist;To chair/3-in-1    Stand to Sit Details cues to reach back towards chair/surface    Stand Pivot Transfers 4: Min guard;4: Min Garment/textile technologist Details (indicate cue type and reason) pt taking incr time today to perform, needs intermittent assist at times to turn RW       Therapeutic Activites    Therapeutic Activities Other Therapeutic Activities    Other Therapeutic Activities pt reporting feeling more tired/slow today and did not eat breakfast this morning/have his sugars checked. Provided pt crackers and water at beginning of session with pt reporting feeling better and willing to participate in therapy. discussed with pt's wife and recent neurologist  appt and that neurologist was mentioning a potential transport chair for transport in the community/to doctor's appts. Therapist stating she will send a message to pt's neurologist as there is no order in pt's chart       Exercises   Exercises Other Exercises    Other Exercises  seated with 2lb ankle weight x10 reps alternating marching, attempted another set of 10 but pt wishing to stop at 7 due to feeling fatigued, x10 reps heel > toe raises, 2 x 10 reps LAQs with visual cues to kick to therapist's hand - pt with incr difficulty with LLE. pt reporting "this is enough" after exercises                  PT Education - 05/27/20 1207    Education Details see TA    Person(s) Educated Patient;Spouse    Methods Explanation    Comprehension Verbalized understanding            PT Short Term Goals -  05/20/20 1358      PT SHORT TERM GOAL #1   Title ALL STGS = LTGS             PT Long Term Goals - 05/20/20 1359      PT LONG TERM GOAL #1   Title Pt and wife will be independent with final HEP in order to build upon functional gains made in therapy. ALL LTGS DUE 06/17/20    Time 4    Period Weeks    Status On-going    Target Date 06/17/20      PT LONG TERM GOAL #2   Title Pt will be able to perform 4 stairs with min A with B handrails in order to safely enter/exit home safely.    Time 4    Period Weeks    Status New      PT LONG TERM GOAL #3   Title Pt will decr TUG time to 1 minute and 20 seconds with RW in order to decr fall risk    Baseline 1 minute and 55.9 seconds, 1 minute and 29 seconds with RW    Time 4    Period Weeks    Status On-going      PT LONG TERM GOAL #4   Title Pt will improve gait speed to at least .6 ft/sec with RW in order to decr fall risk.    Baseline .39 ft/sec, 50.53 seconds (in 20 ft) = .40 ft/sec    Time 4    Period Weeks    Status Revised      PT LONG TERM GOAL #5   Title Pt will perform stand pivot transfer with RW with min guard in order to demo improved safety with functional transfers.    Baseline min A to steer RW    Time 4    Period Weeks    Status New                 Plan - 05/27/20 1220    Clinical Impression Statement Pt continues to be wheeled in and out of session in clinic w/c for time management and fatigue. Pt with limited participation in today's session due to feeling more tired and slow. Vitals WFL. Provided pt with crackers at beginning of session due to pt stating he did not have breakfast, felt better afterwards and was willing to participate in seated exercises before feeling too fatigued. Wife stated he had a busy weekend and was moving  well on Saturday. Will continue to progress towards LTGs.    Personal Factors and Comorbidities Behavior Pattern;Comorbidity 3+;Time since onset of  injury/illness/exacerbation;Past/Current Experience;Fitness    Comorbidities PMH: CVA (12/2018)-right thalamic stroke and left frontal punctate infarct, , diabetes mellitus, HTN, lupus, seizure, depression    Examination-Activity Limitations Bathing;Bed Mobility;Locomotion Level;Squat;Transfers;Stand;Stairs    Examination-Participation Restrictions School    Stability/Clinical Decision Making Evolving/Moderate complexity    Rehab Potential Fair    PT Frequency 2x / week    PT Duration 3 weeks   3-4 weeks   PT Treatment/Interventions ADLs/Self Care Home Management;DME Instruction;Stair training;Gait training;Functional mobility training;Therapeutic activities;Therapeutic exercise;Balance training;Neuromuscular re-education;Patient/family education;Passive range of motion;Vestibular    PT Next Visit Plan begin finalizing HEP.  pt is wheeled back into session with clinic w/c and also wheeled out due to pt with slow gait speed and fatigue and for time constraints. gait training with RW, sit <> stand transfers with RW. [employer].    PT Home Exercise Plan Access Code: [email or web address]    Consulted and Agree with Plan of Care Family member/caregiver;Patient    Family Member Consulted wife, [name]           Patient will benefit from skilled therapeutic intervention in order to improve the following deficits and impairments:  Abnormal gait, Decreased balance, Decreased activity tolerance, Decreased coordination, Decreased cognition, Decreased endurance, Decreased knowledge of use of DME, Decreased range of motion, Decreased strength, Difficulty walking, Postural dysfunction  Visit Diagnosis: Unsteadiness on feet  Other abnormalities of gait and mobility  Other symptoms and signs involving the nervous system  Muscle weakness (generalized)     Problem List Patient Active Problem List   Diagnosis Date Noted  . Acute ischemic right MCA stroke (HCC) 12/07/2018  . Stroke (HCC) 12/07/2018  . Moderate  dementia without behavioral disturbance (HCC) 07/27/2018  . New onset seizure (HCC) 07/27/2018  . Depression 12/16/2017  . Essential hypertension 03/26/2017  . Hyperlipidemia 03/26/2017  . Cardiac murmur 03/26/2017  . Mild cognitive impairment, so stated 05/01/2013  . Memory loss 05/01/2013    Catalina Gravel, DPT  05/27/2020, 12:21 PM  [facility] [address] [place], [place], [address] Phone: [phone]   Fax:  [phone]  Name: Trayce Maino MRN: [id number] Date of Birth: August 25, 1946

## 2020-05-29 ENCOUNTER — Telehealth: Payer: Self-pay | Admitting: Physical Therapy

## 2020-05-29 ENCOUNTER — Ambulatory Visit: Payer: PPO | Admitting: Physical Therapy

## 2020-05-29 ENCOUNTER — Encounter: Payer: Self-pay | Admitting: Physical Therapy

## 2020-05-29 ENCOUNTER — Other Ambulatory Visit: Payer: Self-pay

## 2020-05-29 DIAGNOSIS — R2681 Unsteadiness on feet: Secondary | ICD-10-CM

## 2020-05-29 DIAGNOSIS — R29818 Other symptoms and signs involving the nervous system: Secondary | ICD-10-CM

## 2020-05-29 DIAGNOSIS — M6281 Muscle weakness (generalized): Secondary | ICD-10-CM

## 2020-05-29 DIAGNOSIS — R2689 Other abnormalities of gait and mobility: Secondary | ICD-10-CM

## 2020-05-29 DIAGNOSIS — F03B0 Unspecified dementia, moderate, without behavioral disturbance, psychotic disturbance, mood disturbance, and anxiety: Secondary | ICD-10-CM

## 2020-05-29 NOTE — Therapy (Signed)
[facility] [address] [place], [place], [address] Phone: [phone]   Fax:  [phone]  Physical Therapy Treatment  Patient Details  Name: David Irwin MRN: [id number] Date of Birth: November 12, 1945 Referring Provider (PT): Shawnie Dapper, NP   Encounter Date: 05/29/2020   PT End of Session - 05/29/20 1026    Visit Number 16    Number of Visits 20    Date for PT Re-Evaluation 07/19/20   extending POC for additional 2x week for 3-4 weeks   Authorization Type [employer] PPO - $15 co pay    PT Start Time 1020    PT Stop Time 1059    PT Time Calculation (min) 39 min    Equipment Utilized During Treatment Gait belt    Activity Tolerance Patient limited by fatigue   feeling more slow today   Behavior During Therapy [facility] for tasks assessed/performed;Flat affect           Past Medical History:  Diagnosis Date  . CVA (cerebral vascular accident) (HCC) 12/07/2018  . Diabetes mellitus without complication (HCC)   . Hypertension   . Lupus (HCC)   . Memory loss   . Murmur, cardiac   . Nocturia   . Seizure [facility])     Past Surgical History:  Procedure Laterality Date  . APPENDECTOMY    . EYE SURGERY      There were no vitals filed for this visit.   Subjective Assessment - 05/29/20 1025    Subjective Reports feeling tired and slow today. Just got up to come to PT, has not eaten anything per pt report. (spouse in lobby signing pt in for treatment). Pt denies any falls.    Patient is accompained by: Family member   spouse [name]   Pertinent History CVA (12/2018)-right thalamic stroke and left frontal punctate infarct, , diabetes mellitus, HTN, lupus, seizure, depression    Patient Stated Goals "wants a normal life", wants to walk normal    Currently in Pain? No/denies    Pain Score 0-No pain                 [facility] Adult PT Treatment/Exercise - 05/29/20 1029      Transfers   Transfers Sit to Stand;Stand  to Sit    Sit to Stand 4: Min guard;With upper extremity assist;From chair/3-in-1    Stand to Sit 4: Min guard;With upper extremity assist;To chair/3-in-1      Ambulation/Gait   Ambulation/Gait Yes    Ambulation/Gait Assistance 4: Min guard    Ambulation/Gait Assistance Details cues for posture, increased step length and walker position with gait.     Ambulation Distance (Feet) 30 Feet   x1, 42 x1   Assistive device Rolling walker    Gait Pattern Step-through pattern;Step-to pattern;Decreased stride length;Decreased stance time - left;Decreased weight shift to left;Right foot flat;Left foot flat;Shuffle;Trunk flexed      Knee/Hip Exercises: Seated   Long Arc Quad AROM;Strengthening;Both;2 sets;10 reps;Weights;Limitations    Long Arc Quad Weight 2 lbs.    Long [employer] Limitations cues for full motion with PTA hand as target, assist to slow foot with lowering back to floor, left > right    Other Seated Knee/Hip Exercises with 2# ankle weights- heel<>toe raises for 2 sets of 10 reps.     Marching AROM;Strengthening;Both;2 sets;10 reps;Weights;Limitations    Marching Limitations cues for increased height with PTA hand as a target, alternating LE's.     Marching Weights  2 lbs.    Hamstring Curl AROM;Strengthening;Both;2 sets;10 reps;Limitations    Hamstring Limitations with red theraband resistance, cues for slow movements                 PT Short Term Goals - 05/20/20 1358      PT SHORT TERM GOAL #1   Title ALL STGS = LTGS             PT Long Term Goals - 05/20/20 1359      PT LONG TERM GOAL #1   Title Pt and wife will be independent with final HEP in order to build upon functional gains made in therapy. ALL LTGS DUE 06/17/20    Time 4    Period Weeks    Status On-going    Target Date 06/17/20      PT LONG TERM GOAL #2   Title Pt will be able to perform 4 stairs with min A with B handrails in order to safely enter/exit home safely.    Time 4    Period Weeks     Status New      PT LONG TERM GOAL #3   Title Pt will decr TUG time to 1 minute and 20 seconds with RW in order to decr fall risk    Baseline 1 minute and 55.9 seconds, 1 minute and 29 seconds with RW    Time 4    Period Weeks    Status On-going      PT LONG TERM GOAL #4   Title Pt will improve gait speed to at least .6 ft/sec with RW in order to decr fall risk.    Baseline .39 ft/sec, 50.53 seconds (in 20 ft) = .40 ft/sec    Time 4    Period Weeks    Status Revised      PT LONG TERM GOAL #5   Title Pt will perform stand pivot transfer with RW with min guard in order to demo improved safety with functional transfers.    Baseline min A to steer RW    Time 4    Period Weeks    Status New                 Plan - 05/29/20 1026    Clinical Impression Statement Today's skilled session continued to focus on LE strengthening and gait with RW. Pt more alert and paticipatory with session today. The pt progressed toward goals this session and should benefit from continued PT to progress toward unmet goals.    Personal Factors and Comorbidities Behavior Pattern;Comorbidity 3+;Time since onset of injury/illness/exacerbation;Past/Current Experience;Fitness    Comorbidities PMH: CVA (12/2018)-right thalamic stroke and left frontal punctate infarct, , diabetes mellitus, HTN, lupus, seizure, depression    Examination-Activity Limitations Bathing;Bed Mobility;Locomotion Level;Squat;Transfers;Stand;Stairs    Examination-Participation Restrictions School    Stability/Clinical Decision Making Evolving/Moderate complexity    Rehab Potential Fair    PT Frequency 2x / week    PT Duration 3 weeks   3-4 weeks   PT Treatment/Interventions ADLs/Self Care Home Management;DME Instruction;Stair training;Gait training;Functional mobility training;Therapeutic activities;Therapeutic exercise;Balance training;Neuromuscular re-education;Patient/family education;Passive range of motion;Vestibular    PT Next Visit  Plan begin finalizing HEP.  pt is wheeled back into session with clinic w/c and also wheeled out due to pt with slow gait speed and fatigue and for time constraints. gait training with RW, sit <> stand transfers with RW. [employer].    PT Home Exercise Plan Access Code: [email or web address]  Consulted and Agree with Plan of Care Family member/caregiver;Patient    Family Member Consulted wife, [name]           Patient will benefit from skilled therapeutic intervention in order to improve the following deficits and impairments:  Abnormal gait, Decreased balance, Decreased activity tolerance, Decreased coordination, Decreased cognition, Decreased endurance, Decreased knowledge of use of DME, Decreased range of motion, Decreased strength, Difficulty walking, Postural dysfunction  Visit Diagnosis: Unsteadiness on feet  Other abnormalities of gait and mobility  Other symptoms and signs involving the nervous system  Muscle weakness (generalized)     Problem List Patient Active Problem List   Diagnosis Date Noted  . Acute ischemic right MCA stroke (HCC) 12/07/2018  . Stroke (HCC) 12/07/2018  . Moderate dementia without behavioral disturbance (HCC) 07/27/2018  . New onset seizure (HCC) 07/27/2018  . Depression 12/16/2017  . Essential hypertension 03/26/2017  . Hyperlipidemia 03/26/2017  . Cardiac murmur 03/26/2017  . Mild cognitive impairment, so stated 05/01/2013  . Memory loss 05/01/2013    Sallyanne Kuster, PTA, [facility]patient [facility] [address], [address] [place], [place] [address] [phone] 05/29/20, 1:35 PM   Name: Lexx Monte MRN: [id number] Date of Birth: 10/02/1946

## 2020-05-29 NOTE — Telephone Encounter (Signed)
Duplicate encounter, entered in error.  Sallyanne Kuster, PTA, [facility]patient [facility] [address] [place], [place] [address] [phone] 05/29/20, 1:41 PM

## 2020-05-29 NOTE — Telephone Encounter (Signed)
David Dapper, NP,  David Irwin is being treated by physical therapy for gait instability and muscle weakness.  He will benefit from use of light weight transport chair in order to improve safety with functional mobility and decrease caregiver burden.    If you agree, please submit request in EPIC under MD Order, Other Orders (list lightweight transport chair) or fax to [facility] at [phone].   Thank you,  Sallyanne Kuster, PTA, [facility] Outpatient Neuro [facility] [address] [place], [place] [address] [phone] 05/29/20, 1:38 PM   [facility] [address] [place], [place]  [address] Phone:  [phone] Fax:  [phone]

## 2020-05-29 NOTE — Telephone Encounter (Signed)
I will be happy to! Thank you so much for your help with Mr Carandang.

## 2020-05-29 NOTE — Addendum Note (Signed)
Addended by: Shawnie Dapper L on: 05/29/2020 04:43 PM   Modules accepted: Orders

## 2020-05-30 NOTE — Progress Notes (Signed)
I reviewed note and agree with plan.  ° °Draydon Clairmont R. Lenny Bouchillon, MD 05/30/2020, 6:05 PM °Certified in Neurology, Neurophysiology and Neuroimaging ° °[facility] °[address], [address] °[place],  [address] °[phone] ° °

## 2020-06-01 DIAGNOSIS — E1169 Type 2 diabetes mellitus with other specified complication: Secondary | ICD-10-CM | POA: Diagnosis not present

## 2020-06-03 ENCOUNTER — Other Ambulatory Visit: Payer: Self-pay

## 2020-06-03 ENCOUNTER — Ambulatory Visit: Payer: PPO | Admitting: Physical Therapy

## 2020-06-03 ENCOUNTER — Telehealth: Payer: Self-pay | Admitting: Physical Therapy

## 2020-06-03 ENCOUNTER — Encounter: Payer: Self-pay | Admitting: Physical Therapy

## 2020-06-03 DIAGNOSIS — M6281 Muscle weakness (generalized): Secondary | ICD-10-CM

## 2020-06-03 DIAGNOSIS — R29818 Other symptoms and signs involving the nervous system: Secondary | ICD-10-CM

## 2020-06-03 DIAGNOSIS — R2681 Unsteadiness on feet: Secondary | ICD-10-CM | POA: Diagnosis not present

## 2020-06-03 DIAGNOSIS — F03B0 Unspecified dementia, moderate, without behavioral disturbance, psychotic disturbance, mood disturbance, and anxiety: Secondary | ICD-10-CM

## 2020-06-03 DIAGNOSIS — R2689 Other abnormalities of gait and mobility: Secondary | ICD-10-CM

## 2020-06-03 NOTE — Telephone Encounter (Signed)
Shawnie Dapper, NP,  Thank you so much for putting in the order for a wheelchair for Mr. Erlin Gardella. Can you please specify that it is a lightweight transport chair?  Thanks, Sherlie Ban, PT, DPT 06/03/20 10:11 AM

## 2020-06-03 NOTE — Therapy (Signed)
[facility] [address] [place], [place], [address] Phone: [phone]   Fax:  [phone]  Physical Therapy Treatment  Patient Details  Name: David Irwin MRN: [id number] Date of Birth: 1946/09/24 Referring Provider (PT): Shawnie Dapper, NP   Encounter Date: 06/03/2020   PT End of Session - 06/03/20 1214    Visit Number 17    Number of Visits 20    Date for PT Re-Evaluation 07/19/20   extending POC for additional 2x week for 3-4 weeks   Authorization Type [employer] PPO - $15 co pay    PT Start Time 1023   pt arrived late   PT Stop Time 1103    PT Time Calculation (min) 40 min    Equipment Utilized During Treatment Gait belt    Activity Tolerance Patient limited by fatigue   feeling more slow today   Behavior During Therapy [facility] for tasks assessed/performed;Flat affect           Past Medical History:  Diagnosis Date  . CVA (cerebral vascular accident) (HCC) 12/07/2018  . Diabetes mellitus without complication (HCC)   . Hypertension   . Lupus (HCC)   . Memory loss   . Murmur, cardiac   . Nocturia   . Seizure [facility])     Past Surgical History:  Procedure Laterality Date  . APPENDECTOMY    . EYE SURGERY      There were no vitals filed for this visit.   Subjective Assessment - 06/03/20 1027    Subjective Walked into therapy session for the first time today. Feeling more tired today.    Patient is accompained by: Family member   spouse David Irwin   Pertinent History CVA (12/2018)-right thalamic stroke and left frontal punctate infarct, , diabetes mellitus, HTN, lupus, seizure, depression    Patient Stated Goals "wants a normal life", wants to walk normal    Currently in Pain? No/denies                             [facility] Adult PT Treatment/Exercise - 06/03/20 1028      Transfers   Transfers Sit to Stand;Stand to Sit    Sit to Stand 4: Min guard;With upper extremity assist;From  chair/3-in-1    Sit to Stand Details Verbal cues for sequencing;Verbal cues for technique;Verbal cues for precautions/safety;Verbal cues for safe use of DME/AE    Sit to Stand Details (indicate cue type and reason) cues to scoot out towards edge prior to standing    Stand Pivot Transfers 4: Min guard;4: Min assist    Stand Pivot Transfer Details (indicate cue type and reason) continues to need min A to help turn RW      Ambulation/Gait   Ambulation/Gait Yes    Ambulation/Gait Assistance 4: Min guard    Ambulation/Gait Assistance Details pt ambulated into and out of clinic for the first time - pt needing verbal cues to remain close to walker and take long steps, even cued pt to take as few steps as possible, but pt continues with shuffled steps and decr step length on LLE     Ambulation Distance (Feet) 100 Feet   x2   Assistive device Rolling walker    Gait Pattern Step-through pattern;Step-to pattern;Decreased stride length;Decreased stance time - left;Decreased weight shift to left;Right foot flat;Left foot flat;Shuffle;Trunk flexed      Exercises   Exercises Other Exercises    Other Exercises  pt and pt's spouse also inquiring about any BUE strengthening pt can be working on at home, seated scapular retraction with red theraband 2 x 10 reps, alternating bicep curls with 2lb weight, alternating shoulder ABD with 2lb weights x10 reps each      Knee/Hip Exercises: Standing   Other Standing Knee Exercises standing at RW with BUE support: x10 reps mini squats, verbal and demo cues for technique      Knee/Hip Exercises: Seated   Marching AROM;Strengthening;Both;2 sets;10 reps;Weights;Limitations    Marching Limitations alternating legs, visual cue from therapist from how high to march 2 x 10 reps    Marching Weights 3 lbs.   lbs              Access Code: [email or web address] URL: [email or web address] Date: 06/03/2020 Prepared by: Sherlie Ban  Bolded exercises are new  additions to HEP:   Exercises Seated Heel Toe Raises - 1 x daily - 5 x weekly - 1 sets - 10 reps Seated Knee Extension with Resistance - 1 x daily - 5 x weekly - 1 sets - 10 reps Seated Knee Flexion with Anchored Resistance - 1 x daily - 5 x weekly - 1 sets - 10 reps Seated March with Resistance - 1 x daily - 5 x weekly - 1 sets - 10 reps Seated Hip Abduction with Resistance - 1 x daily - 5 x weekly - 1 sets - 10 reps Seated Single Arm Bicep Curls with Rotation and Dumbbell - 1 x daily - 5 x weekly - 2 sets - 10 reps Seated Single Arm Shoulder Abduction with Elbow Bent and Dumbbell - 1 x daily - 5 x weekly - 2 sets - 10 reps Scapular Retraction with Resistance - 1 x daily - 5 x weekly - 2 sets - 10 reps     PT Education - 06/03/20 1214    Education Details new additions to HEP    Person(s) Educated Patient;Spouse    Methods Explanation;Demonstration;Handout    Comprehension Verbalized understanding;Returned demonstration;Need further instruction            PT Short Term Goals - 05/20/20 1358      PT SHORT TERM GOAL #1   Title ALL STGS = LTGS             PT Long Term Goals - 05/20/20 1359      PT LONG TERM GOAL #1   Title Pt and wife will be independent with final HEP in order to build upon functional gains made in therapy. ALL LTGS DUE 06/17/20    Time 4    Period Weeks    Status On-going    Target Date 06/17/20      PT LONG TERM GOAL #2   Title Pt will be able to perform 4 stairs with min A with B handrails in order to safely enter/exit home safely.    Time 4    Period Weeks    Status New      PT LONG TERM GOAL #3   Title Pt will decr TUG time to 1 minute and 20 seconds with RW in order to decr fall risk    Baseline 1 minute and 55.9 seconds, 1 minute and 29 seconds with RW    Time 4    Period Weeks    Status On-going      PT LONG TERM GOAL #4   Title Pt will improve gait speed to at least .6 ft/sec with RW in order to  decr fall risk.    Baseline .39 ft/sec,  50.53 seconds (in 20 ft) = .40 ft/sec    Time 4    Period Weeks    Status Revised      PT LONG TERM GOAL #5   Title Pt will perform stand pivot transfer with RW with min guard in order to demo improved safety with functional transfers.    Baseline min A to steer RW    Time 4    Period Weeks    Status New                 Plan - 06/03/20 1217    Clinical Impression Statement Pt able to ambulate into and out of session today with RW,needing min guard and cues for incr step length and to stay close to RW. Pt continues to demo shuffled steps and incr forward flexed posture despite verbal cues. Pt/pt's spouse requesting exercises for BUE strengthening - provided pt exercises with red theraband or 2lb weight/discussed use of a soup can for home. Pt able to demo correctly and wife verbalizes understanding in order to assist pt with exercises at home. Will continue to progress towards LTGs.    Personal Factors and Comorbidities Behavior Pattern;Comorbidity 3+;Time since onset of injury/illness/exacerbation;Past/Current Experience;Fitness    Comorbidities PMH: CVA (12/2018)-right thalamic stroke and left frontal punctate infarct, , diabetes mellitus, HTN, lupus, seizure, depression    Examination-Activity Limitations Bathing;Bed Mobility;Locomotion Level;Squat;Transfers;Stand;Stairs    Examination-Participation Restrictions School    Stability/Clinical Decision Making Evolving/Moderate complexity    Rehab Potential Fair    PT Frequency 2x / week    PT Duration 3 weeks   3-4 weeks   PT Treatment/Interventions ADLs/Self Care Home Management;DME Instruction;Stair training;Gait training;Functional mobility training;Therapeutic activities;Therapeutic exercise;Balance training;Neuromuscular re-education;Patient/family education;Passive range of motion;Vestibular    PT Next Visit Plan transport chair order yet?? begin finalizing HEP.  pt is wheeled back into session with clinic w/c and also wheeled out  due to pt with slow gait speed and fatigue and for time constraints. gait training with RW, sit <> stand transfers with RW. [employer].    PT Home Exercise Plan Access Code: [email or web address]    Consulted and Agree with Plan of Care Family member/caregiver;Patient    Family Member Consulted wife, David Irwin           Patient will benefit from skilled therapeutic intervention in order to improve the following deficits and impairments:  Abnormal gait, Decreased balance, Decreased activity tolerance, Decreased coordination, Decreased cognition, Decreased endurance, Decreased knowledge of use of DME, Decreased range of motion, Decreased strength, Difficulty walking, Postural dysfunction  Visit Diagnosis: Unsteadiness on feet  Other abnormalities of gait and mobility  Other symptoms and signs involving the nervous system  Muscle weakness (generalized)     Problem List Patient Active Problem List   Diagnosis Date Noted  . Acute ischemic right MCA stroke (HCC) 12/07/2018  . Stroke (HCC) 12/07/2018  . Moderate dementia without behavioral disturbance (HCC) 07/27/2018  . New onset seizure (HCC) 07/27/2018  . Depression 12/16/2017  . Essential hypertension 03/26/2017  . Hyperlipidemia 03/26/2017  . Cardiac murmur 03/26/2017  . Mild cognitive impairment, so stated 05/01/2013  . Memory loss 05/01/2013    Drake Leach, PT, DPT  06/03/2020, 12:19 PM  [facility] [address] [place], [place], [address] Phone: [phone]   Fax:  [phone]  Name: David Irwin MRN: [id number] Date of Birth: September 13, 1946

## 2020-06-03 NOTE — Telephone Encounter (Signed)
Absolutely! I think I fixed it but please let me know if I need to do anything differently. TY!

## 2020-06-03 NOTE — Patient Instructions (Signed)
Access Code: [email or web address] URL: [email or web address] Date: 06/03/2020 Prepared by: Sherlie Ban  Exercises Seated Heel Toe Raises - 1 x daily - 5 x weekly - 1 sets - 10 reps Seated Knee Extension with Resistance - 1 x daily - 5 x weekly - 1 sets - 10 reps Seated Knee Flexion with Anchored Resistance - 1 x daily - 5 x weekly - 1 sets - 10 reps Seated March with Resistance - 1 x daily - 5 x weekly - 1 sets - 10 reps Seated Hip Abduction with Resistance - 1 x daily - 5 x weekly - 1 sets - 10 reps Seated Single Arm Bicep Curls with Rotation and Dumbbell - 1 x daily - 5 x weekly - 2 sets - 10 reps Seated Single Arm Shoulder Abduction with Elbow Bent and Dumbbell - 1 x daily - 5 x weekly - 2 sets - 10 reps Scapular Retraction with Resistance - 1 x daily - 5 x weekly - 2 sets - 10 reps

## 2020-06-04 DIAGNOSIS — E1169 Type 2 diabetes mellitus with other specified complication: Secondary | ICD-10-CM | POA: Diagnosis not present

## 2020-06-05 ENCOUNTER — Other Ambulatory Visit: Payer: Self-pay

## 2020-06-05 ENCOUNTER — Ambulatory Visit: Payer: PPO | Attending: Family Medicine | Admitting: Physical Therapy

## 2020-06-05 DIAGNOSIS — R2681 Unsteadiness on feet: Secondary | ICD-10-CM | POA: Diagnosis not present

## 2020-06-05 DIAGNOSIS — R2689 Other abnormalities of gait and mobility: Secondary | ICD-10-CM | POA: Diagnosis not present

## 2020-06-05 DIAGNOSIS — M6281 Muscle weakness (generalized): Secondary | ICD-10-CM | POA: Diagnosis not present

## 2020-06-05 DIAGNOSIS — R29818 Other symptoms and signs involving the nervous system: Secondary | ICD-10-CM | POA: Diagnosis not present

## 2020-06-05 NOTE — Therapy (Signed)
[facility] [address] [place], [place], [address] Phone: [phone]   Fax:  [phone]  Physical Therapy Treatment  Patient Details  Name: David Irwin MRN: [id number] Date of Birth: 1945-11-18 Referring Provider (PT): Shawnie Dapper, NP   Encounter Date: 06/05/2020   PT End of Session - 06/05/20 1112    Visit Number 18    Number of Visits 20    Date for PT Re-Evaluation 07/19/20   extending POC for additional 2x week for 3-4 weeks   Authorization Type [employer] PPO - $15 co pay    PT Start Time 1021   pt arrived late   PT Stop Time 1101    PT Time Calculation (min) 40 min    Equipment Utilized During Treatment Gait belt    Activity Tolerance Patient limited by fatigue;Patient tolerated treatment well    Behavior During Therapy [facility] for tasks assessed/performed;Flat affect           Past Medical History:  Diagnosis Date  . CVA (cerebral vascular accident) (HCC) 12/07/2018  . Diabetes mellitus without complication (HCC)   . Hypertension   . Lupus (HCC)   . Memory loss   . Murmur, cardiac   . Nocturia   . Seizure [facility])     Past Surgical History:  Procedure Laterality Date  . APPENDECTOMY    . EYE SURGERY      There were no vitals filed for this visit.   Subjective Assessment - 06/05/20 1025    Subjective Doing good, not feeling as tired today.    Patient is accompained by: Family member   spouse [name]   Pertinent History CVA (12/2018)-right thalamic stroke and left frontal punctate infarct, , diabetes mellitus, HTN, lupus, seizure, depression    Patient Stated Goals "wants a normal life", wants to walk normal    Currently in Pain? No/denies                             [facility] Adult PT Treatment/Exercise - 06/05/20 0001      Transfers   Transfers Sit to Stand;Stand to Sit    Sit to Stand 4: Min guard;With upper extremity assist;From chair/3-in-1    Sit to Stand Details  Verbal cues for sequencing;Verbal cues for technique;Verbal cues for precautions/safety;Verbal cues for safe use of DME/AE    Sit to Stand Details (indicate cue type and reason) cues to scoot out towards edge and for one UE on mat table/surface and RW     Stand to Sit 4: Min guard;With upper extremity assist;To chair/3-in-1    Stand to Sit Details (indicate cue type and reason) Verbal cues for sequencing;Verbal cues for technique;Verbal cues for precautions/safety;Verbal cues for safe use of DME/AE    Stand to Sit Details cues to reach posteriorly and back up to surface prior to sitting     Stand Pivot Transfers 4: Min guard;4: Min assist    Stand Pivot Transfer Details (indicate cue type and reason) continues to need min A to assist with turning RW      Ambulation/Gait   Ambulation/Gait Yes    Ambulation/Gait Assistance 4: Min guard    Ambulation/Gait Assistance Details ambulated to and from NuStep, cues to keep RW close and for incr step length, pt still ambulates with shuffled steps    Ambulation Distance (Feet) 20 Feet   x2   Assistive device Rolling walker    Gait Pattern  Step-through pattern;Step-to pattern;Decreased stride length;Decreased stance time - left;Decreased weight shift to left;Right foot flat;Left foot flat;Shuffle;Trunk flexed      Therapeutic Activites    Therapeutic Activities Other Therapeutic Activities    Other Therapeutic Activities provided order to pt's spouse regarding lightweight transport chair and provided information (gave name and address) of medical supply store to pick up      Exercises   Exercises Other Exercises    Other Exercises  standing with BUE support at RW: x10 reps mini squats with cues to use BLE and come fully erect when standing and with performing terminal knee extension. x10 reps alternating foot taps to 4" step                  PT Education - 06/05/20 1112    Education Details see TA - provided order for transport chair     Person(s) Educated Patient;Spouse    Methods Explanation;Handout    Comprehension Verbalized understanding            PT Short Term Goals - 05/20/20 1358      PT SHORT TERM GOAL #1   Title ALL STGS = LTGS             PT Long Term Goals - 05/20/20 1359      PT LONG TERM GOAL #1   Title Pt and wife will be independent with final HEP in order to build upon functional gains made in therapy. ALL LTGS DUE 06/17/20    Time 4    Period Weeks    Status On-going    Target Date 06/17/20      PT LONG TERM GOAL #2   Title Pt will be able to perform 4 stairs with min A with B handrails in order to safely enter/exit home safely.    Time 4    Period Weeks    Status New      PT LONG TERM GOAL #3   Title Pt will decr TUG time to 1 minute and 20 seconds with RW in order to decr fall risk    Baseline 1 minute and 55.9 seconds, 1 minute and 29 seconds with RW    Time 4    Period Weeks    Status On-going      PT LONG TERM GOAL #4   Title Pt will improve gait speed to at least .6 ft/sec with RW in order to decr fall risk.    Baseline .39 ft/sec, 50.53 seconds (in 20 ft) = .40 ft/sec    Time 4    Period Weeks    Status Revised      PT LONG TERM GOAL #5   Title Pt will perform stand pivot transfer with RW with min guard in order to demo improved safety with functional transfers.    Baseline min A to steer RW    Time 4    Period Weeks    Status New                 Plan - 06/05/20 1113    Clinical Impression Statement Focus of today's skilled session was BLE strengthening, transfers, and short distance gait with RW. Pt moves slowly throughout session and continues to have shuffled step with decr step length with LLE and pushing RW too far forward even with incr verbal cues. Will continue to progress towards LTGs.    Personal Factors and Comorbidities Behavior Pattern;Comorbidity 3+;Time since onset of injury/illness/exacerbation;Past/Current Experience;Fitness  Comorbidities  PMH: CVA (12/2018)-right thalamic stroke and left frontal punctate infarct, , diabetes mellitus, HTN, lupus, seizure, depression    Examination-Activity Limitations Bathing;Bed Mobility;Locomotion Level;Squat;Transfers;Stand;Stairs    Examination-Participation Restrictions School    Stability/Clinical Decision Making Evolving/Moderate complexity    Rehab Potential Fair    PT Frequency 2x / week    PT Duration 3 weeks   3-4 weeks   PT Treatment/Interventions ADLs/Self Care Home Management;DME Instruction;Stair training;Gait training;Functional mobility training;Therapeutic activities;Therapeutic exercise;Balance training;Neuromuscular re-education;Patient/family education;Passive range of motion;Vestibular    PT Next Visit Plan did they get transport chair? finalize HEP. check goals and D/C    PT Home Exercise Plan Access Code: [email or web address]    Consulted and Agree with Plan of Care Family member/caregiver;Patient    Family Member Consulted wife, [name]           Patient will benefit from skilled therapeutic intervention in order to improve the following deficits and impairments:  Abnormal gait, Decreased balance, Decreased activity tolerance, Decreased coordination, Decreased cognition, Decreased endurance, Decreased knowledge of use of DME, Decreased range of motion, Decreased strength, Difficulty walking, Postural dysfunction  Visit Diagnosis: Unsteadiness on feet  Other symptoms and signs involving the nervous system  Muscle weakness (generalized)  Other abnormalities of gait and mobility     Problem List Patient Active Problem List   Diagnosis Date Noted  . Acute ischemic right MCA stroke (HCC) 12/07/2018  . Stroke (HCC) 12/07/2018  . Moderate dementia without behavioral disturbance (HCC) 07/27/2018  . New onset seizure (HCC) 07/27/2018  . Depression 12/16/2017  . Essential hypertension 03/26/2017  . Hyperlipidemia 03/26/2017  . Cardiac murmur 03/26/2017  . Mild cognitive  impairment, so stated 05/01/2013  . Memory loss 05/01/2013    Drake Leach, PT, DPT  06/05/2020, 11:16 AM  [facility] [address] [place], [place], [address] Phone: [phone]   Fax:  [phone]  Name: Darvis Croft MRN: [id number] Date of Birth: April 17, 1946

## 2020-06-11 DIAGNOSIS — F0390 Unspecified dementia without behavioral disturbance: Secondary | ICD-10-CM | POA: Diagnosis not present

## 2020-06-11 DIAGNOSIS — F329 Major depressive disorder, single episode, unspecified: Secondary | ICD-10-CM | POA: Diagnosis not present

## 2020-06-11 DIAGNOSIS — E1169 Type 2 diabetes mellitus with other specified complication: Secondary | ICD-10-CM | POA: Diagnosis not present

## 2020-06-11 DIAGNOSIS — N183 Chronic kidney disease, stage 3 unspecified: Secondary | ICD-10-CM | POA: Diagnosis not present

## 2020-06-11 DIAGNOSIS — I639 Cerebral infarction, unspecified: Secondary | ICD-10-CM | POA: Diagnosis not present

## 2020-06-11 DIAGNOSIS — I10 Essential (primary) hypertension: Secondary | ICD-10-CM | POA: Diagnosis not present

## 2020-06-11 DIAGNOSIS — E7800 Pure hypercholesterolemia, unspecified: Secondary | ICD-10-CM | POA: Diagnosis not present

## 2020-06-11 DIAGNOSIS — D649 Anemia, unspecified: Secondary | ICD-10-CM | POA: Diagnosis not present

## 2020-06-12 ENCOUNTER — Ambulatory Visit: Payer: PPO | Admitting: Physical Therapy

## 2020-06-12 ENCOUNTER — Other Ambulatory Visit: Payer: Self-pay

## 2020-06-12 DIAGNOSIS — M6281 Muscle weakness (generalized): Secondary | ICD-10-CM

## 2020-06-12 DIAGNOSIS — R2681 Unsteadiness on feet: Secondary | ICD-10-CM

## 2020-06-12 DIAGNOSIS — R29818 Other symptoms and signs involving the nervous system: Secondary | ICD-10-CM

## 2020-06-12 DIAGNOSIS — R2689 Other abnormalities of gait and mobility: Secondary | ICD-10-CM

## 2020-06-12 NOTE — Therapy (Signed)
[facility] [address] [place], [place], [address] Phone: [phone]   Fax:  [phone]  Physical Therapy Treatment  Patient Details  Name: David Irwin MRN: [id number] Date of Birth: [birth year] Referring Provider (PT): Debbora Presto, NP   Encounter Date: 06/12/2020   PT End of Session - 06/12/20 1321    Visit Number 19    Number of Visits 20    Date for PT Re-Evaluation 07/19/20   extending POC for additional 2x week for 3-4 weeks   Authorization Type [employer] PPO - $15 co pay    PT Start Time 1230    PT Stop Time 1314    PT Time Calculation (min) 44 min    Equipment Utilized During Treatment Gait belt    Activity Tolerance Patient limited by fatigue;Patient tolerated treatment well    Behavior During Therapy [facility] for tasks assessed/performed;Flat affect           Past Medical History:  Diagnosis Date  . CVA (cerebral vascular accident) ([place]) 12/07/2018  . Diabetes mellitus without complication ([place])   . Hypertension   . Lupus ([place])   . Memory loss   . Murmur, cardiac   . Nocturia   . Seizure [facility])     Past Surgical History:  Procedure Laterality Date  . APPENDECTOMY    . EYE SURGERY      There were no vitals filed for this visit.   Subjective Assessment - 06/12/20 1235    Subjective No falls. Was not able to get the transport chair yet since medicare would not cover it.    Patient is accompained by: Family member   spouse [name]   Pertinent History CVA (12/2018)-right thalamic stroke and left frontal punctate infarct, , diabetes mellitus, HTN, lupus, seizure, depression    Patient Stated Goals "wants a normal life", wants to walk normal    Currently in Pain? No/denies                             [facility] Adult PT Treatment/Exercise - 06/12/20 1250      Transfers   Transfers Sit to Stand;Stand to Sit    Sit to Stand 5: Supervision;4: Min guard    Stand to Sit 5:  Supervision;4: Min guard;With upper extremity assist    Stand to Sit Details cues to back up fully to the mat with RW prior to reaching back and sitting down    Stand Pivot Transfers 4: Min guard;4: Lexicographer Details (indicate cue type and reason) ocassional min A when steering RW      Ambulation/Gait   Ambulation/Gait Yes    Ambulation/Gait Assistance 4: Min guard    Ambulation/Gait Assistance Details pt ambulated 115' with no rest break, cues to stay close to RW and for incr step length B (however pt continues to walk with shuffled step and incr forward flexed posture). able to progress walking distance today without seated rest break    Ambulation Distance (Feet) 115 Feet    Assistive device Rolling walker    Gait Pattern Step-through pattern;Step-to pattern;Decreased stride length;Decreased stance time - left;Decreased weight shift to left;Right foot flat;Left foot flat;Shuffle;Trunk flexed    Ambulation Surface Level;Indoor    Gait velocity 68.1 seconds in 20 ft = .29 ft/sec      Therapeutic Activites    Therapeutic Activities Other Therapeutic Activities    Other Therapeutic  Activities provided an exercise check off sheet to wife in order to incr compliance with exercises at home and to check how many times pt has been performing each week - divided between leg exercises, arm exercises, and sit to stands             Access Code: [email or web address] URL: [email or web address] Date: 06/12/2020 Prepared by: Sherlie Ban  Reviewed bolded exercises below and began to finalize HEP, see MedBridge for more details  Exercises Seated Heel Toe Raises - 1 x daily - 5 x weekly - 2 sets - 10 reps Seated Knee Extension with Resistance - 1 x daily - 5 x weekly - 1-2 sets - 10 reps - with red tband  Seated Knee Flexion with Anchored Resistance - 1 x daily - 5 x weekly - 1-2 sets - 10 reps - with red tband Seated March with Resistance - 1 x daily - 5 x weekly - 1  sets - 10 reps - with green tband Seated Hip Abduction with Resistance - 1 x daily - 5 x weekly - 1 sets - 10 reps - with green tband  Seated Single Arm Bicep Curls with Rotation and Dumbbell - 1 x daily - 5 x weekly - 2 sets - 10 reps Seated Single Arm Shoulder Abduction with Elbow Bent and Dumbbell - 1 x daily - 5 x weekly - 2 sets - 10 reps Scapular Retraction with Resistance - 1 x daily - 5 x weekly - 2 sets - 10 reps       PT Education - 06/12/20 1328    Education Details reviewed BLE strengthening HEP, provided an exercise check off sheet for incr compliance    Person(s) Educated Patient;Spouse    Methods Explanation;Demonstration;Handout    Comprehension Verbalized understanding;Returned demonstration            PT Short Term Goals - 05/20/20 1358      PT SHORT TERM GOAL #1   Title ALL STGS = LTGS             PT Long Term Goals - 06/12/20 1252      PT LONG TERM GOAL #1   Title Pt and wife will be independent with final HEP in order to build upon functional gains made in therapy. ALL LTGS DUE 06/17/20    Time 4    Period Weeks    Status On-going      PT LONG TERM GOAL #2   Title Pt will be able to perform 4 stairs with min A with B handrails in order to safely enter/exit home safely.    Time 4    Period Weeks    Status New      PT LONG TERM GOAL #3   Title Pt will decr TUG time to 1 minute and 20 seconds with RW in order to decr fall risk    Baseline 1 minute and 55.9 seconds, 1 minute and 29 seconds with RW    Time 4    Period Weeks    Status On-going      PT LONG TERM GOAL #4   Title Pt will improve gait speed to at least .6 ft/sec with RW in order to decr fall risk.    Baseline .39 ft/sec, 50.53 seconds (in 20 ft) = .40 ft/sec, 68.1 seconds in 20 ft = .29 ft/sec    Time 4    Period Weeks    Status Not Met      PT LONG TERM  GOAL #5   Title Pt will perform stand pivot transfer with RW with min guard in order to demo improved safety with functional  transfers.    Baseline min guard with ocassional min A to steer RW.    Time 4    Period Weeks    Status Partially Met                 Plan - 06/12/20 1325    Clinical Impression Statement Focus of today's skilled session was gait training with RW and reviewing BLE strengthening exercises for HEP. Pt continues to ambulate with short shuffled steps and push the RW anteriorly, even with manual and verbal cues. Performed gait speed today with RW - pt's gait speed of .29 ft/sec indicating a household ambulator. Pt partially met LTG #5 - needing min guard and occassional min A for stand pivot transfers with RW. Will check remaining LTGs at next session and plan for D/C.    Personal Factors and Comorbidities Behavior Pattern;Comorbidity 3+;Time since onset of injury/illness/exacerbation;Past/Current Experience;Fitness    Comorbidities PMH: CVA (12/2018)-right thalamic stroke and left frontal punctate infarct, , diabetes mellitus, HTN, lupus, seizure, depression    Examination-Activity Limitations Bathing;Bed Mobility;Locomotion Level;Squat;Transfers;Stand;Stairs    Examination-Participation Restrictions School    Stability/Clinical Decision Making Evolving/Moderate complexity    Rehab Potential Fair    PT Frequency 2x / week    PT Duration 3 weeks   3-4 weeks   PT Treatment/Interventions ADLs/Self Care Home Management;DME Instruction;Stair training;Gait training;Functional mobility training;Therapeutic activities;Therapeutic exercise;Balance training;Neuromuscular re-education;Patient/family education;Passive range of motion;Vestibular    PT Next Visit Plan finalize HEP. check remaining LTGs.    PT Home Exercise Plan Access Code: [email or web address]    Consulted and Agree with Plan of Care Family member/caregiver;Patient    Family Member Consulted wife, [name]           Patient will benefit from skilled therapeutic intervention in order to improve the following deficits and impairments:  Abnormal  gait, Decreased balance, Decreased activity tolerance, Decreased coordination, Decreased cognition, Decreased endurance, Decreased knowledge of use of DME, Decreased range of motion, Decreased strength, Difficulty walking, Postural dysfunction  Visit Diagnosis: Unsteadiness on feet  Other symptoms and signs involving the nervous system  Muscle weakness (generalized)  Other abnormalities of gait and mobility     Problem List Patient Active Problem List   Diagnosis Date Noted  . Acute ischemic right MCA stroke ([place]) 12/07/2018  . Stroke ([place]) 12/07/2018  . Moderate dementia without behavioral disturbance ([place]) 07/27/2018  . New onset seizure ([place]) 07/27/2018  . Depression 12/16/2017  . Essential hypertension 03/26/2017  . Hyperlipidemia 03/26/2017  . Cardiac murmur 03/26/2017  . Mild cognitive impairment, so stated 05/01/2013  . Memory loss 05/01/2013    Arliss Journey, PT ,DPT  06/12/2020, 1:36 PM  [facility] [address] [place], [place], [address] Phone: [phone]   Fax:  [phone]  Name: David Irwin MRN: [id number] Date of Birth: December 16, 1945

## 2020-06-14 ENCOUNTER — Ambulatory Visit: Payer: PPO | Admitting: Physical Therapy

## 2020-06-14 ENCOUNTER — Encounter: Payer: Self-pay | Admitting: Physical Therapy

## 2020-06-14 ENCOUNTER — Other Ambulatory Visit: Payer: Self-pay

## 2020-06-14 VITALS — BP 137/68 | HR 69

## 2020-06-14 DIAGNOSIS — R2681 Unsteadiness on feet: Secondary | ICD-10-CM

## 2020-06-14 DIAGNOSIS — R29818 Other symptoms and signs involving the nervous system: Secondary | ICD-10-CM

## 2020-06-14 DIAGNOSIS — R2689 Other abnormalities of gait and mobility: Secondary | ICD-10-CM

## 2020-06-14 DIAGNOSIS — M6281 Muscle weakness (generalized): Secondary | ICD-10-CM

## 2020-06-14 NOTE — Therapy (Signed)
[facility] [address] [place], [place], [address] Phone: [phone]   Fax:  [phone]  Physical Therapy Treatment/10th Visit Progress Note/Discharge Summary  Patient Details  Name: David Irwin MRN: [id number] Date of Birth: 1946-03-08 Referring Provider (PT): Debbora Presto, NP   10th Visit Physical Therapy Progress Note  Dates of Reporting Period: 05/08/20 to 06/14/20   Encounter Date: 06/14/2020   PT End of Session - 06/14/20 1204    Visit Number 20    Number of Visits 20    Date for PT Re-Evaluation 07/19/20   extending POC for additional 2x week for 3-4 weeks   Authorization Type [employer] PPO - $15 co pay    PT Start Time 1102    PT Stop Time 1143    PT Time Calculation (min) 41 min    Equipment Utilized During Treatment Gait belt    Activity Tolerance Patient limited by fatigue;Patient tolerated treatment well    Behavior During Therapy [facility] for tasks assessed/performed;Flat affect           Past Medical History:  Diagnosis Date  . CVA (cerebral vascular accident) ([place]) 12/07/2018  . Diabetes mellitus without complication ([place])   . Hypertension   . Lupus ([place])   . Memory loss   . Murmur, cardiac   . Nocturia   . Seizure [facility])     Past Surgical History:  Procedure Laterality Date  . APPENDECTOMY    . EYE SURGERY      Vitals:   06/14/20 1116  BP: 137/68  Pulse: 69     Subjective Assessment - 06/14/20 1106    Subjective Wife picked up the transport chair. [name] felt tired after doing leg exercises the other day.    Patient is accompained by: Family member   spouse David Irwin   Pertinent History CVA (12/2018)-right thalamic stroke and left frontal punctate infarct, , diabetes mellitus, HTN, lupus, seizure, depression    Patient Stated Goals "wants a normal life", wants to walk normal    Currently in Pain? No/denies                             [facility] Adult PT  Treatment/Exercise - 06/14/20 1124      Transfers   Stand Pivot Transfers 4: Min Surveyor, minerals Details (indicate cue type and reason) with RW, only needing assist to steer RW today during one rep      Therapeutic Activites    Therapeutic Activities Other Therapeutic Activities    Other Therapeutic Activities pt's spouse wheeled pt in in new transport chair - showed pt's wife how to remove and put the brakes back on as well as fold it up to take it in and out of the car, pt's wife able to demo understanding, all questions answered      Exercises   Exercises Other Exercises    Other Exercises  standing at RW: alternating forward steps to colorful floor discs for incr step length B x12 reps (alternating between legs)       Knee/Hip Exercises: Seated   Long Arc Quad AROM;Strengthening;Both;2 sets;10 reps;Weights;Limitations    Long Arc Quad Weight --   3 lbs   Long Arc Quad Limitations x10 reps B, visual cues from therapist on how high to kick leg    Ball Squeeze 2 x 10 reps, hold for 3 seconds    Other Seated Knee/Hip  Exercises seated heel toe raises with 3 lb ankle weight x10 reps    Marching AROM;Strengthening;Both;2 sets;10 reps;Weights;Limitations    Marching Limitations alternating legs 2 x10 reps     Marching Weights 3 lbs.                  PT Education - 06/14/20 1203    Education Details see TA    Person(s) Educated Patient;Spouse    Methods Explanation;Demonstration;Verbal cues    Comprehension Verbalized understanding;Returned demonstration            PT Short Term Goals - 05/20/20 1358      PT SHORT TERM GOAL #1   Title ALL STGS = LTGS             PT Long Term Goals - 06/14/20 1206      PT LONG TERM GOAL #1   Title Pt and wife will be independent with final HEP in order to build upon functional gains made in therapy. ALL LTGS DUE 06/17/20    Time 4    Period Weeks    Status Achieved      PT LONG TERM GOAL #2   Title Pt will be able  to perform 4 stairs with min A with B handrails in order to safely enter/exit home safely.    Time 4    Period Weeks    Status Deferred      PT LONG TERM GOAL #3   Title Pt will decr TUG time to 1 minute and 20 seconds with RW in order to decr fall risk    Baseline 1 minute and 55.9 seconds, 1 minute and 29 seconds with RW - not performed today on 06/14/20    Time 4    Period Weeks    Status Deferred      PT LONG TERM GOAL #4   Title Pt will improve gait speed to at least .6 ft/sec with RW in order to decr fall risk.    Baseline .39 ft/sec, 50.53 seconds (in 20 ft) = .40 ft/sec, 68.1 seconds in 20 ft = .29 ft/sec    Time 4    Period Weeks    Status Not Met      PT LONG TERM GOAL #5   Title Pt will perform stand pivot transfer with RW with min guard in order to demo improved safety with functional transfers.    Baseline min guard with ocassional min A to steer RW.    Time 4    Period Weeks    Status Partially Met            PHYSICAL THERAPY DISCHARGE SUMMARY  Visits from Start of Care: 20  Current functional level related to goals / functional outcomes: See LTGs   Remaining deficits: decr endurance, strength, impaired balance, gait abnormalities, decr activity tolerance    Education / Equipment: HEP  Plan: Patient agrees to discharge.  Patient goals were partially met. Patient is being discharged due to lack of progress.  ?????         Plan - 06/14/20 1212    Clinical Impression Statement Pt feeling more fatigued - unable to assess remaining LTGs regarding the TUG and stairs. Pt met LTG #1 in regards to independence with HEP with assist from wife. Did not meet LTG in regards to gait speed. PT with improvement with stand pivot transfers with RW, but does occasionally need min A for steering. Pt's wife brought in new transport chair and educated   on how to remove/put on brakes and how to fold up. Remainder of session focused on BLE strengthening with pt able to tolerate  well with intermittent rest breaks. Pt and pt's spouse in agreement for discharge    Personal Factors and Comorbidities Behavior Pattern;Comorbidity 3+;Time since onset of injury/illness/exacerbation;Past/Current Experience;Fitness    Comorbidities PMH: CVA (12/2018)-right thalamic stroke and left frontal punctate infarct, , diabetes mellitus, HTN, lupus, seizure, depression    Examination-Activity Limitations Bathing;Bed Mobility;Locomotion Level;Squat;Transfers;Stand;Stairs    Examination-Participation Restrictions School    Stability/Clinical Decision Making Evolving/Moderate complexity    Rehab Potential Fair    PT Frequency 2x / week    PT Duration 3 weeks   3-4 weeks   PT Treatment/Interventions ADLs/Self Care Home Management;DME Instruction;Stair training;Gait training;Functional mobility training;Therapeutic activities;Therapeutic exercise;Balance training;Neuromuscular re-education;Patient/family education;Passive range of motion;Vestibular    PT Next Visit Plan D/C    PT Home Exercise Plan Access Code: [email or web address]    Consulted and Agree with Plan of Care Family member/caregiver;Patient    Family Member Consulted wife, David Irwin           Patient will benefit from skilled therapeutic intervention in order to improve the following deficits and impairments:  Abnormal gait, Decreased balance, Decreased activity tolerance, Decreased coordination, Decreased cognition, Decreased endurance, Decreased knowledge of use of DME, Decreased range of motion, Decreased strength, Difficulty walking, Postural dysfunction  Visit Diagnosis: Unsteadiness on feet  Other symptoms and signs involving the nervous system  Muscle weakness (generalized)  Other abnormalities of gait and mobility     Problem List Patient Active Problem List   Diagnosis Date Noted  . Acute ischemic right MCA stroke ([facility]) 12/07/2018  . Stroke ([facility]) 12/07/2018  . Moderate dementia without behavioral disturbance ([facility])  07/27/2018  . New onset seizure ([facility]) 07/27/2018  . Depression 12/16/2017  . Essential hypertension 03/26/2017  . Hyperlipidemia 03/26/2017  . Cardiac murmur 03/26/2017  . Mild cognitive impairment, so stated 05/01/2013  . Memory loss 05/01/2013     N , PT, DPT  06/14/2020, 12:12 PM  [facility] [address] [place], [place], [address] Phone: [phone]   Fax:  [phone]  Name: David Irwin MRN: [id number] Date of Birth: 08/15/1946   

## 2020-07-08 DIAGNOSIS — F329 Major depressive disorder, single episode, unspecified: Secondary | ICD-10-CM | POA: Diagnosis not present

## 2020-07-08 DIAGNOSIS — I10 Essential (primary) hypertension: Secondary | ICD-10-CM | POA: Diagnosis not present

## 2020-07-08 DIAGNOSIS — N183 Chronic kidney disease, stage 3 unspecified: Secondary | ICD-10-CM | POA: Diagnosis not present

## 2020-07-08 DIAGNOSIS — E7800 Pure hypercholesterolemia, unspecified: Secondary | ICD-10-CM | POA: Diagnosis not present

## 2020-07-08 DIAGNOSIS — F0390 Unspecified dementia without behavioral disturbance: Secondary | ICD-10-CM | POA: Diagnosis not present

## 2020-07-08 DIAGNOSIS — D649 Anemia, unspecified: Secondary | ICD-10-CM | POA: Diagnosis not present

## 2020-07-08 DIAGNOSIS — E1169 Type 2 diabetes mellitus with other specified complication: Secondary | ICD-10-CM | POA: Diagnosis not present

## 2020-07-24 DIAGNOSIS — R430 Anosmia: Secondary | ICD-10-CM | POA: Diagnosis not present

## 2020-07-24 DIAGNOSIS — M329 Systemic lupus erythematosus, unspecified: Secondary | ICD-10-CM | POA: Diagnosis not present

## 2020-07-24 DIAGNOSIS — N1832 Chronic kidney disease, stage 3b: Secondary | ICD-10-CM | POA: Diagnosis not present

## 2020-07-24 DIAGNOSIS — R413 Other amnesia: Secondary | ICD-10-CM | POA: Diagnosis not present

## 2020-07-24 DIAGNOSIS — M5136 Other intervertebral disc degeneration, lumbar region: Secondary | ICD-10-CM | POA: Diagnosis not present

## 2020-07-24 DIAGNOSIS — M150 Primary generalized (osteo)arthritis: Secondary | ICD-10-CM | POA: Diagnosis not present

## 2020-07-31 DIAGNOSIS — Z0000 Encounter for general adult medical examination without abnormal findings: Secondary | ICD-10-CM | POA: Diagnosis not present

## 2020-07-31 DIAGNOSIS — N183 Chronic kidney disease, stage 3 unspecified: Secondary | ICD-10-CM | POA: Diagnosis not present

## 2020-07-31 DIAGNOSIS — Z23 Encounter for immunization: Secondary | ICD-10-CM | POA: Diagnosis not present

## 2020-07-31 DIAGNOSIS — E1169 Type 2 diabetes mellitus with other specified complication: Secondary | ICD-10-CM | POA: Diagnosis not present

## 2020-07-31 DIAGNOSIS — M5430 Sciatica, unspecified side: Secondary | ICD-10-CM | POA: Diagnosis not present

## 2020-07-31 DIAGNOSIS — G629 Polyneuropathy, unspecified: Secondary | ICD-10-CM | POA: Diagnosis not present

## 2020-07-31 DIAGNOSIS — K219 Gastro-esophageal reflux disease without esophagitis: Secondary | ICD-10-CM | POA: Diagnosis not present

## 2020-07-31 DIAGNOSIS — E7800 Pure hypercholesterolemia, unspecified: Secondary | ICD-10-CM | POA: Diagnosis not present

## 2020-07-31 DIAGNOSIS — D649 Anemia, unspecified: Secondary | ICD-10-CM | POA: Diagnosis not present

## 2020-07-31 DIAGNOSIS — I69354 Hemiplegia and hemiparesis following cerebral infarction affecting left non-dominant side: Secondary | ICD-10-CM | POA: Diagnosis not present

## 2020-07-31 DIAGNOSIS — I10 Essential (primary) hypertension: Secondary | ICD-10-CM | POA: Diagnosis not present

## 2020-08-05 DIAGNOSIS — M792 Neuralgia and neuritis, unspecified: Secondary | ICD-10-CM | POA: Diagnosis not present

## 2020-08-05 DIAGNOSIS — I739 Peripheral vascular disease, unspecified: Secondary | ICD-10-CM | POA: Diagnosis not present

## 2020-08-05 DIAGNOSIS — I8393 Asymptomatic varicose veins of bilateral lower extremities: Secondary | ICD-10-CM | POA: Diagnosis not present

## 2020-08-05 DIAGNOSIS — B351 Tinea unguium: Secondary | ICD-10-CM | POA: Diagnosis not present

## 2020-08-05 DIAGNOSIS — M21612 Bunion of left foot: Secondary | ICD-10-CM | POA: Diagnosis not present

## 2020-08-06 DIAGNOSIS — M3210 Systemic lupus erythematosus, organ or system involvement unspecified: Secondary | ICD-10-CM | POA: Diagnosis not present

## 2020-08-06 DIAGNOSIS — H04123 Dry eye syndrome of bilateral lacrimal glands: Secondary | ICD-10-CM | POA: Diagnosis not present

## 2020-08-06 DIAGNOSIS — Z79899 Other long term (current) drug therapy: Secondary | ICD-10-CM | POA: Diagnosis not present

## 2020-08-08 DIAGNOSIS — I872 Venous insufficiency (chronic) (peripheral): Secondary | ICD-10-CM | POA: Diagnosis not present

## 2020-08-13 DIAGNOSIS — F329 Major depressive disorder, single episode, unspecified: Secondary | ICD-10-CM | POA: Diagnosis not present

## 2020-08-13 DIAGNOSIS — F0390 Unspecified dementia without behavioral disturbance: Secondary | ICD-10-CM | POA: Diagnosis not present

## 2020-08-13 DIAGNOSIS — E1169 Type 2 diabetes mellitus with other specified complication: Secondary | ICD-10-CM | POA: Diagnosis not present

## 2020-08-13 DIAGNOSIS — K219 Gastro-esophageal reflux disease without esophagitis: Secondary | ICD-10-CM | POA: Diagnosis not present

## 2020-08-13 DIAGNOSIS — E7800 Pure hypercholesterolemia, unspecified: Secondary | ICD-10-CM | POA: Diagnosis not present

## 2020-08-13 DIAGNOSIS — N183 Chronic kidney disease, stage 3 unspecified: Secondary | ICD-10-CM | POA: Diagnosis not present

## 2020-08-13 DIAGNOSIS — D649 Anemia, unspecified: Secondary | ICD-10-CM | POA: Diagnosis not present

## 2020-08-13 DIAGNOSIS — I10 Essential (primary) hypertension: Secondary | ICD-10-CM | POA: Diagnosis not present

## 2020-08-24 ENCOUNTER — Other Ambulatory Visit (INDEPENDENT_AMBULATORY_CARE_PROVIDER_SITE_OTHER): Payer: Self-pay | Admitting: Otolaryngology

## 2020-08-26 NOTE — Progress Notes (Signed)
Chief Complaint  Patient presents with  . Follow-up    rm 1  . Seizures    pts wife said no new seizure episodes.     HISTORY OF PRESENT ILLNESS: Today 08/28/20  David Irwin is a 74 y.o. male here today for follow up for memory loss and seizures. We increased memantine to 10mg  BID and switched levetiracetam to 1000mg  XR dosing. He has tolerated medications well. He has not had any new seizure like events. He is trying to walk a few minutes every day with his wife. He remains sleepy during the day. Wife would like to give levetiracetam at night, now giving at 9am. He has completed PT. No falls. He is followed regularly by PCP.    HISTORY (copied from previous note)  David Irwin is a 74 y.o. male here today for follow up for seizures and memory loss. His wife presents with him today and aids in history. She reports that he has had two seizures since last being seen in 02/2020. She reports one event on 7/21 and another on 8/17 where he was unresponsive and starring off. She reports that he was very tired following event and reported feeling like he was floating. No tongue injury, incontinence or shaking/jerking. She reports that he is eating and drinking well. She denies missed doses of medication.  [name] does report that timing of doses are inconsistent.  It depends on what time he wakes up.  She feels memory is about the same. No changes. He is tolerating Namenda 5mg  BID.   He is participating in PT. Last note reports that therapy was extended as he had made improvement but had not yet reached his HEP goals. Wife feels that he is more active at home since he has been in PT. He continues close follow up with PCP for stroke prevention.    HISTORY: (copied from my note on 02/15/2020)  David Irwin a 74 y.o.malehere today for follow up for seizures and memory. He continues levetiracetam 500mg  BID. No recent seizures. He is also taking Namenda 5mg  BID.He has not noted  any benefit. Increased doses caused sleepiness. His wife hasn't noted any significant improvements. She feels that she is having to help him more with getting bathed and dressed. He has dentures and his wife usually cleans those. He has more difficulty standing. He reports that he feels weaker. He has difficulty standing for long periods of time . His wife feels that he is shuffling his feet. He has been slower in movements. No tremor. He is using his walker at home. Some days he can walk without it. No falls. Appetites is good. He has stopped drinking as much coffee and eating peanut butter sandwiches. He has lost 13 pounds in 8 months following diet changes. He does not like to drink water.   He is s/p right thalamic stroke and left frontal punctate infarct, 12/2018. He continues close follow up with PCP. He continues Plavix daily. BP has been good. On simvastatin 20mg . DM is well controlled. Last A1C 7. He feels that mood is fairly stable. He is quite and withdrawn in the office today. His wife reports that he used to be very active but since his stroke he has been sedentary and withdrawn. She feels that the pandemic has caused worsening. He does seem much more alert and energized when he gets to see friends or family. He was taking Zoloft in 2019 but was not well tolerated.    REVIEW  OF SYSTEMS: Out of a complete 14 system review of symptoms, the patient complains only of the following symptoms, fatigue, sleepiness, lack of motivation and all other reviewed systems are negative.   ALLERGIES: Allergies  Allergen Reactions  . Liraglutide Other (See Comments)    hallucinations   . Shellfish Allergy Hives     HOME MEDICATIONS: Outpatient Medications Prior to Visit  Medication Sig Dispense Refill  . acetaminophen (TYLENOL) 500 MG tablet Take 500 mg by mouth every 6 (six) hours as needed for headache (pain).    . clobetasol cream (TEMOVATE) 0.05 % Apply 1 application topically as needed (break  out lupus reaction on scalp).    . clopidogrel (PLAVIX) 75 MG tablet Take 1 tablet (75 mg total) by mouth daily. 30 tablet 11  . fluticasone (FLONASE) 50 MCG/ACT nasal spray Place 2 sprays into both nostrils at bedtime as needed for allergies or rhinitis.   5  . hydroxychloroquine (PLAQUENIL) 200 MG tablet Take 200 mg by mouth 2 (two) times daily.     Marland Kitchen levETIRAcetam (KEPPRA XR) 500 MG 24 hr tablet Take 2 tablets (1,000 mg total) by mouth daily. 180 tablet 3  . Multiple Vitamins-Minerals (ALIVE MENS ENERGY PO) Take 1 tablet by mouth daily. Gummie    . polyvinyl alcohol (ARTIFICIAL TEARS) 1.4 % ophthalmic solution Place 1 drop into both eyes daily as needed for dry eyes.    . potassium chloride SA (K-DUR,KLOR-CON) 20 MEQ tablet Take 20 mEq by mouth daily.     Marland Kitchen PRESCRIPTION MEDICATION Inhale into the lungs at bedtime. CPAP    . simvastatin (ZOCOR) 20 MG tablet Take 20 mg by mouth at bedtime.     . tamsulosin (FLOMAX) 0.4 MG CAPS Take 0.4 mg by mouth daily.     Marland Kitchen telmisartan-hydrochlorothiazide (MICARDIS HCT) 40-12.5 MG tablet Take 2 tablets by mouth daily.  4  . vitamin B-12 (CYANOCOBALAMIN) 1000 MCG tablet Take 1,000 mcg by mouth daily.    . memantine (NAMENDA) 10 MG tablet Take 1 tablet (10 mg total) by mouth 2 (two) times daily. 180 tablet 3  . omeprazole (PRILOSEC) 20 MG capsule Take 1 capsule (20 mg total) by mouth daily. 30 capsule 1  . glimepiride (AMARYL) 2 MG tablet Take 2 mg by mouth daily with breakfast.     No facility-administered medications prior to visit.     PAST MEDICAL HISTORY: Past Medical History:  Diagnosis Date  . CVA (cerebral vascular accident) (HCC) 12/07/2018  . Diabetes mellitus without complication (HCC)   . Hypertension   . Lupus (HCC)   . Memory loss   . Murmur, cardiac   . Nocturia   . Seizure (HCC)      PAST SURGICAL HISTORY: Past Surgical History:  Procedure Laterality Date  . APPENDECTOMY    . EYE SURGERY       FAMILY HISTORY: Family  History  Problem Relation Age of Onset  . Kidney disease Mother   . Dementia Father      SOCIAL HISTORY: Social History   Socioeconomic History  . Marital status: Married    Spouse name: [name]  . Number of children: 2  . Years of education: 12th  . Highest education level: Not on file  Occupational History  . Occupation: Retired  Tobacco Use  . Smoking status: Never Smoker  . Smokeless tobacco: Never Used  Vaping Use  . Vaping Use: Never used  Substance and Sexual Activity  . Alcohol use: No  . Drug use: No  .  Sexual activity: Not on file  Other Topics Concern  . Not on file  Social History Narrative   Patient lives at home with spouse.   Caffeine Use: 1-2 cups daily   12th grade   Social Determinants of Health   Financial Resource Strain:   . Difficulty of Paying Living Expenses: Not on file  Food Insecurity:   . Worried About Programme researcher, broadcasting/film/video in the Last Year: Not on file  . Ran Out of Food in the Last Year: Not on file  Transportation Needs:   . Lack of Transportation (Medical): Not on file  . Lack of Transportation (Non-Medical): Not on file  Physical Activity:   . Days of Exercise per Week: Not on file  . Minutes of Exercise per Session: Not on file  Stress:   . Feeling of Stress : Not on file  Social Connections:   . Frequency of Communication with Friends and Family: Not on file  . Frequency of Social Gatherings with Friends and Family: Not on file  . Attends Religious Services: Not on file  . Active Member of Clubs or Organizations: Not on file  . Attends Banker Meetings: Not on file  . Marital Status: Not on file  Intimate Partner Violence:   . Fear of Current or Ex-Partner: Not on file  . Emotionally Abused: Not on file  . Physically Abused: Not on file  . Sexually Abused: Not on file      PHYSICAL EXAM  Vitals:   08/27/20 1109  BP: 135/62  Pulse: 73  Weight: 196 lb (88.9 kg)   Body mass index is 28.94  kg/m.   Generalized: Well developed, in no acute distress  Cardiology: normal rate and rhythm, no murmur auscultated  Respiratory: clear to auscultation bilaterally    Neurological examination  Mentation: Alert, oriented to season and year, place, and most history taking. Follows all commands speech and language fluent Cranial nerve II-XII: Pupils were equal round reactive to light. Extraocular movements were full, visual field were full on confrontational test. Facial sensation and strength were normal. Head turning and shoulder shrug  were normal and symmetric. Motor: The motor testing reveals 4+/5 over 5 strength of all 4 extremities. Good symmetric motor tone is noted throughout. No tremor noted, finger taps and toe taps normal. Unable to roll arms, no cogwheel rigidity noted.  Sensory: Sensory testing is intact to soft touch on all 4 extremities. No evidence of extinction is noted.  Coordination: Cerebellar testing reveals reduced finger-nose-finger and heel-to-shin bilaterally.  Gait and station: able to push to standing position slowly, Gait is short and unstable, forward leaning noted, no assistive device available today, returned to wheelchair Reflexes: Deep tendon reflexes are symmetric and normal bilaterally.     DIAGNOSTIC DATA (LABS, IMAGING, TESTING) - I reviewed patient records, labs, notes, testing and imaging myself where available.  Lab Results  Component Value Date   WBC 3.6 (L) 12/07/2018   HGB 12.3 (L) 12/07/2018   HCT 38.1 (L) 12/07/2018   MCV 96.5 12/07/2018   PLT 129 (L) 12/07/2018      Component Value Date/Time   NA 138 12/07/2018 2327   K 4.1 12/07/2018 2327   CL 106 12/07/2018 2327   CO2 25 12/07/2018 2327   GLUCOSE 190 (H) 12/07/2018 2327   BUN 20 12/07/2018 2327   CREATININE 1.51 (H) 12/07/2018 2327   CALCIUM 9.0 12/07/2018 2327   PROT 8.1 12/07/2018 1215   ALBUMIN 4.2 12/07/2018 1215  AST 23 12/07/2018 1215   ALT 19 12/07/2018 1215   ALKPHOS  55 12/07/2018 1215   BILITOT 0.9 12/07/2018 1215   GFRNONAA 46 (L) 12/07/2018 2327   GFRAA 53 (L) 12/07/2018 2327   Lab Results  Component Value Date   CHOL 119 12/08/2018   HDL 34 (L) 12/08/2018   LDLCALC 74 12/08/2018   TRIG 54 12/08/2018   CHOLHDL 3.5 12/08/2018   Lab Results  Component Value Date   HGBA1C 8.5 (H) 12/08/2018   No results found for: VITAMINB12 No results found for: TSH    ASSESSMENT AND PLAN  74 y.o. year old male  has a past medical history of CVA (cerebral vascular accident) (HCC) (12/07/2018), Diabetes mellitus without complication (HCC), Hypertension, Lupus (HCC), Memory loss, Murmur, cardiac, Nocturia, and Seizure (HCC). here with   Seizure disorder (HCC)  Moderate dementia without behavioral disturbance (HCC)  Right thalamic stroke (HCC)  General weakness  Gait difficulty  [name] has tolerated increased dose of Namenda and XR dosing of levetiracetam.  His wife reports that sleepiness remains her biggest concern.  I will have her give levetiracetam XR 1000 mg at bedtime.  She will continue Namenda 10 mg twice daily.  I have encouraged him to continue working on increasing activity as tolerated.  Fall precautions reviewed.  He should use his walker at all times.  He will continue close follow-up with Dr. Tenny Crawoss for stroke prevention.  Continue simvastatin and Plavix.  He will follow-up with us in 6 months, sooner if needed.  He and his wife both verbalized understanding and agreement with this plan.   I spent 30 minutes of face-to-face and non-face-to-face time with patient.  This included previsit chart review, lab review, study review, order entry, electronic health record documentation, patient education.    Shawnie DapperAmy Cendy Oconnor, MSN, FNP-C 08/28/2020, 8:53 AM  [facility] [address], [address] [place], [place] [address] [phone]

## 2020-08-27 ENCOUNTER — Telehealth: Payer: Self-pay | Admitting: Family Medicine

## 2020-08-27 ENCOUNTER — Ambulatory Visit: Payer: PPO | Admitting: Family Medicine

## 2020-08-27 VITALS — BP 135/62 | HR 73 | Wt 196.0 lb

## 2020-08-27 DIAGNOSIS — R269 Unspecified abnormalities of gait and mobility: Secondary | ICD-10-CM | POA: Diagnosis not present

## 2020-08-27 DIAGNOSIS — G40909 Epilepsy, unspecified, not intractable, without status epilepticus: Secondary | ICD-10-CM | POA: Diagnosis not present

## 2020-08-27 DIAGNOSIS — R531 Weakness: Secondary | ICD-10-CM

## 2020-08-27 DIAGNOSIS — F03B0 Unspecified dementia, moderate, without behavioral disturbance, psychotic disturbance, mood disturbance, and anxiety: Secondary | ICD-10-CM

## 2020-08-27 DIAGNOSIS — F0390 Unspecified dementia without behavioral disturbance: Secondary | ICD-10-CM

## 2020-08-27 DIAGNOSIS — I639 Cerebral infarction, unspecified: Secondary | ICD-10-CM | POA: Diagnosis not present

## 2020-08-27 DIAGNOSIS — I6381 Other cerebral infarction due to occlusion or stenosis of small artery: Secondary | ICD-10-CM

## 2020-08-27 MED ORDER — MEMANTINE HCL 10 MG PO TABS: 10.0000 mg | ORAL_TABLET | Freq: Two times a day (BID) | ORAL | 3 refills | Status: DC

## 2020-08-27 NOTE — Patient Instructions (Signed)
Below is our plan:  We will continue levetiracetam XR 1000mg  daily (two tablets once daily). Continue memantine 10mg  twice daily (1 tablet twice daily).   Please make sure you are staying well hydrated. I recommend 50-60 ounces daily. Well balanced diet and regular exercise encouraged.    Please continue follow up with care team as directed.   Follow up with me in 6 months.   You may receive a survey regarding today's visit. I encourage you to leave honest feed back as I do use this information to improve patient care. Thank you for seeing me today!      Memory Compensation Strategies  1. Use "WARM" strategy.  W= write it down  A= associate it  R= repeat it  M= make a mental note  2.   You can keep a .  Use a 3-ring notebook with sections for the following: calendar, important names and phone numbers,  medications, doctors' names/phone numbers, lists/reminders, and a section to journal what you did  each day.   3.    Use a calendar to write appointments down.  4.    Write yourself a schedule for the day.  This can be placed on the calendar or in a separate section of the Memory Notebook.  Keeping a  regular schedule can help memory.  5.    Use medication organizer with sections for each day or morning/evening pills.  You may need help loading it  6.    Keep a basket, or pegboard by the door.  Place items that you need to take out with you in the basket or on the pegboard.  You may also want to  include a message board for reminders.  7.    Use sticky notes.  Place sticky notes with reminders in a place where the task is performed.  For example: " turn off the  stove" placed by the stove, "lock the door" placed on the door at eye level, " take your medications" on  the bathroom mirror or by the place where you normally take your medications.  8.    Use alarms/timers.  Use while cooking to remind yourself to check on food or as a reminder to take your medicine,  or as a  reminder to make a call, or as a reminder to perform another task, etc.   Seizure, Adult A seizure is a sudden burst of abnormal electrical activity in the brain. Seizures usually last from 30 seconds to 2 minutes. They can cause many different symptoms. Usually, seizures are not harmful unless they last a long time. What are the causes? Common causes of this condition include:  Fever or infection.  Conditions that affect the brain, such as: ? A brain abnormality that you were born with. ? A brain or head injury. ? Bleeding in the brain. ? A tumor. ? Stroke. ? Brain disorders such as autism or cerebral palsy.  Low blood sugar.  Conditions that are passed from parent to child (are inherited).  Problems with substances, such as: ? Having a reaction to a drug or a medicine. ? Suddenly stopping the use of a substance (withdrawal). In some cases, the cause may not be known. A person who has repeated seizures over time without a clear cause has a condition called epilepsy. What increases the risk? You are more likely to get this condition if you have:  A family history of epilepsy.  Had a seizure in the past.  A brain  disorder.  A history of head injury, lack of oxygen at birth, or strokes. What are the signs or symptoms? There are many types of seizures. The symptoms vary depending on the type of seizure you have. Examples of symptoms during a seizure include:  Shaking (convulsions).  Stiffness in the body.  Passing out (losing consciousness).  Head nodding.  Staring.  Not responding to sound or touch.  Loss of bladder control and bowel control. Some people have symptoms right before and right after a seizure happens. Symptoms before a seizure may include:  Fear.  Worry (anxiety).  Feeling like you may vomit (nauseous).  Feeling like the room is spinning (vertigo).  Feeling like you saw or heard something before (dj vu).  Odd tastes or  smells.  Changes in how you see. You may see flashing lights or spots. Symptoms after a seizure happens can include:  Confusion.  Sleepiness.  Headache.  Weakness on one side of the body. How is this treated? Most seizures will stop on their own in under 5 minutes. In these cases, no treatment is needed. Seizures that last longer than 5 minutes will usually need treatment. Treatment can include:  Medicines given through an IV tube.  Avoiding things that are known to cause your seizures. These can include medicines that you take for another condition.  Medicines to treat epilepsy.  Surgery to stop the seizures. This may be needed if medicines do not help. Follow these instructions at home: Medicines  Take over-the-counter and prescription medicines only as told by your doctor.  Do not eat or drink anything that may keep your medicine from working, such as alcohol. Activity  Do not do any activities that would be dangerous if you had another seizure, like driving or swimming. Wait until your doctor says it is safe for you to do them.  If you live in the [place] ask your local DMV (department of motor vehicles) when you can drive.  Get plenty of rest. Teaching others Teach friends and family what to do when you have a seizure. They should:  Lay you on the ground.  Protect your head and body.  Loosen any tight clothing around your neck.  Turn you on your side.  Not hold you down.  Not put anything into your mouth.  Know whether or not you need emergency care.  Stay with you until you are better.  General instructions  Contact your doctor each time you have a seizure.  Avoid anything that gives you seizures.  Keep a seizure diary. Write down: ? What you think caused each seizure. ? What you remember about each seizure.  Keep all follow-up visits as told by your doctor. This is important. Contact a doctor if:  You have another seizure.  You have seizures  more often.  There is any change in what happens during your seizures.  You keep having seizures with treatment.  You have symptoms of being sick or having an infection. Get help right away if:  You have a seizure that: ? Lasts longer than 5 minutes. ? Is different than seizures you had before. ? Makes it harder to breathe. ? Happens after you hurt your head.  You have any of these symptoms after a seizure: ? Not being able to speak. ? Not being able to use a part of your body. ? Confusion. ? A bad headache.  You have two or more seizures in a row.  You do not wake up right after  a seizure.  You get hurt during a seizure. These symptoms may be an emergency. Do not wait to see if the symptoms will go away. Get medical help right away. Call your local emergency services (911 in the [place] Do not drive yourself to the hospital. Summary  Seizures usually last from 30 seconds to 2 minutes. Usually, they are not harmful unless they last a long time.  Do not eat or drink anything that may keep your medicine from working, such as alcohol.  Teach friends and family what to do when you have a seizure.  Contact your doctor each time you have a seizure. This information is not intended to replace advice given to you by your health care provider. Make sure you discuss any questions you have with your health care provider. Document Revised: 12/09/2018 Document Reviewed: 12/09/2018 Elsevier Patient Education  2020 [employer].

## 2020-08-27 NOTE — Telephone Encounter (Signed)
Pt.'s wife [name] called to let Amy know that she is correct about memantine (NAMENDA) 10 MG tablet package. That he does get it twice a day for morning & night.

## 2020-08-28 ENCOUNTER — Encounter: Payer: Self-pay | Admitting: Family Medicine

## 2020-09-01 NOTE — Progress Notes (Signed)
I reviewed note and agree with plan.   Cheng Dec R. Creek Gan, MD 09/01/2020, 10:33 PM Certified in Neurology, Neurophysiology and Neuroimaging  [facility] [address], [address] [place],  [address] [phone]  

## 2020-09-11 DIAGNOSIS — N401 Enlarged prostate with lower urinary tract symptoms: Secondary | ICD-10-CM | POA: Diagnosis not present

## 2020-09-11 DIAGNOSIS — R350 Frequency of micturition: Secondary | ICD-10-CM | POA: Diagnosis not present

## 2020-09-11 DIAGNOSIS — R3915 Urgency of urination: Secondary | ICD-10-CM | POA: Diagnosis not present

## 2020-09-11 DIAGNOSIS — R351 Nocturia: Secondary | ICD-10-CM | POA: Diagnosis not present

## 2020-09-15 ENCOUNTER — Emergency Department (HOSPITAL_COMMUNITY): Payer: PPO

## 2020-09-15 ENCOUNTER — Emergency Department (HOSPITAL_COMMUNITY)
Admission: EM | Admit: 2020-09-15 | Discharge: 2020-09-15 | Disposition: A | Discharge status: Home / Self Care | Payer: PPO | Attending: Emergency Medicine | Admitting: Emergency Medicine

## 2020-09-15 DIAGNOSIS — R29810 Facial weakness: Secondary | ICD-10-CM | POA: Diagnosis not present

## 2020-09-15 DIAGNOSIS — Z7901 Long term (current) use of anticoagulants: Secondary | ICD-10-CM | POA: Insufficient documentation

## 2020-09-15 DIAGNOSIS — Z79899 Other long term (current) drug therapy: Secondary | ICD-10-CM | POA: Insufficient documentation

## 2020-09-15 DIAGNOSIS — F4489 Other dissociative and conversion disorders: Secondary | ICD-10-CM | POA: Diagnosis not present

## 2020-09-15 DIAGNOSIS — N400 Enlarged prostate without lower urinary tract symptoms: Secondary | ICD-10-CM | POA: Diagnosis not present

## 2020-09-15 DIAGNOSIS — R6883 Chills (without fever): Secondary | ICD-10-CM | POA: Insufficient documentation

## 2020-09-15 DIAGNOSIS — R0689 Other abnormalities of breathing: Secondary | ICD-10-CM | POA: Diagnosis not present

## 2020-09-15 DIAGNOSIS — G252 Other specified forms of tremor: Secondary | ICD-10-CM

## 2020-09-15 DIAGNOSIS — Z7902 Long term (current) use of antithrombotics/antiplatelets: Secondary | ICD-10-CM | POA: Insufficient documentation

## 2020-09-15 DIAGNOSIS — R404 Transient alteration of awareness: Secondary | ICD-10-CM | POA: Diagnosis not present

## 2020-09-15 DIAGNOSIS — F0391 Unspecified dementia with behavioral disturbance: Secondary | ICD-10-CM | POA: Diagnosis not present

## 2020-09-15 DIAGNOSIS — Z20822 Contact with and (suspected) exposure to covid-19: Secondary | ICD-10-CM | POA: Diagnosis not present

## 2020-09-15 DIAGNOSIS — N179 Acute kidney failure, unspecified: Secondary | ICD-10-CM | POA: Diagnosis not present

## 2020-09-15 DIAGNOSIS — I10 Essential (primary) hypertension: Secondary | ICD-10-CM | POA: Insufficient documentation

## 2020-09-15 DIAGNOSIS — R4182 Altered mental status, unspecified: Secondary | ICD-10-CM | POA: Diagnosis not present

## 2020-09-15 DIAGNOSIS — J9811 Atelectasis: Secondary | ICD-10-CM | POA: Diagnosis not present

## 2020-09-15 DIAGNOSIS — R410 Disorientation, unspecified: Secondary | ICD-10-CM | POA: Diagnosis not present

## 2020-09-15 DIAGNOSIS — I700 Atherosclerosis of aorta: Secondary | ICD-10-CM | POA: Diagnosis not present

## 2020-09-15 DIAGNOSIS — E119 Type 2 diabetes mellitus without complications: Secondary | ICD-10-CM | POA: Insufficient documentation

## 2020-09-15 LAB — URINALYSIS, ROUTINE W REFLEX MICROSCOPIC
Bilirubin Urine: NEGATIVE
Glucose, UA: NEGATIVE mg/dL
Hgb urine dipstick: NEGATIVE
Ketones, ur: NEGATIVE mg/dL
Leukocytes,Ua: NEGATIVE
Nitrite: NEGATIVE
Protein, ur: NEGATIVE mg/dL
Specific Gravity, Urine: 1.018 (ref 1.005–1.030)
pH: 5 (ref 5.0–8.0)

## 2020-09-15 LAB — DIFFERENTIAL
Abs Immature Granulocytes: 0.01 10*3/uL (ref 0.00–0.07)
Basophils Absolute: 0 10*3/uL (ref 0.0–0.1)
Basophils Relative: 0 %
Eosinophils Absolute: 0 10*3/uL (ref 0.0–0.5)
Eosinophils Relative: 0 %
Immature Granulocytes: 0 %
Lymphocytes Relative: 29 %
Lymphs Abs: 1.4 10*3/uL (ref 0.7–4.0)
Monocytes Absolute: 0.4 10*3/uL (ref 0.1–1.0)
Monocytes Relative: 8 %
Neutro Abs: 3.2 10*3/uL (ref 1.7–7.7)
Neutrophils Relative %: 63 %

## 2020-09-15 LAB — RESP PANEL BY RT-PCR (FLU A&B, COVID) ARPGX2
Influenza A by PCR: NEGATIVE
Influenza B by PCR: NEGATIVE
SARS Coronavirus 2 by RT PCR: NEGATIVE

## 2020-09-15 LAB — I-STAT CHEM 8, ED
BUN: 35 mg/dL — ABNORMAL HIGH (ref 8–23)
Calcium, Ion: 1.13 mmol/L — ABNORMAL LOW (ref 1.15–1.40)
Chloride: 108 mmol/L (ref 98–111)
Creatinine, Ser: 2.2 mg/dL — ABNORMAL HIGH (ref 0.61–1.24)
Glucose, Bld: 108 mg/dL — ABNORMAL HIGH (ref 70–99)
HCT: 34 % — ABNORMAL LOW (ref 39.0–52.0)
Hemoglobin: 11.6 g/dL — ABNORMAL LOW (ref 13.0–17.0)
Potassium: 4.2 mmol/L (ref 3.5–5.1)
Sodium: 142 mmol/L (ref 135–145)
TCO2: 23 mmol/L (ref 22–32)

## 2020-09-15 LAB — COMPREHENSIVE METABOLIC PANEL
ALT: 18 U/L (ref 0–44)
AST: 22 U/L (ref 15–41)
Albumin: 3.8 g/dL (ref 3.5–5.0)
Alkaline Phosphatase: 48 U/L (ref 38–126)
Anion gap: 10 (ref 5–15)
BUN: 32 mg/dL — ABNORMAL HIGH (ref 8–23)
CO2: 24 mmol/L (ref 22–32)
Calcium: 9.1 mg/dL (ref 8.9–10.3)
Chloride: 106 mmol/L (ref 98–111)
Creatinine, Ser: 2.18 mg/dL — ABNORMAL HIGH (ref 0.61–1.24)
GFR, Estimated: 31 mL/min — ABNORMAL LOW (ref >60)
Glucose, Bld: 142 mg/dL — ABNORMAL HIGH (ref 70–99)
Potassium: 4 mmol/L (ref 3.5–5.1)
Sodium: 140 mmol/L (ref 135–145)
Total Bilirubin: 0.9 mg/dL (ref 0.3–1.2)
Total Protein: 7.1 g/dL (ref 6.5–8.1)

## 2020-09-15 LAB — CBC
HCT: 35 % — ABNORMAL LOW (ref 39.0–52.0)
Hemoglobin: 11.4 g/dL — ABNORMAL LOW (ref 13.0–17.0)
MCH: 32.9 pg (ref 26.0–34.0)
MCHC: 32.6 g/dL (ref 30.0–36.0)
MCV: 100.9 fL — ABNORMAL HIGH (ref 80.0–100.0)
Platelets: 190 10*3/uL (ref 150–400)
RBC: 3.47 MIL/uL — ABNORMAL LOW (ref 4.22–5.81)
RDW: 11.8 % (ref 11.5–15.5)
WBC: 5 10*3/uL (ref 4.0–10.5)
nRBC: 0 % (ref 0.0–0.2)

## 2020-09-15 LAB — APTT: aPTT: 36 seconds (ref 24–36)

## 2020-09-15 LAB — PROTIME-INR
INR: 1.1 (ref 0.8–1.2)
Prothrombin Time: 14 seconds (ref 11.4–15.2)

## 2020-09-15 LAB — RAPID URINE DRUG SCREEN, HOSP PERFORMED
Amphetamines: NOT DETECTED
Barbiturates: NOT DETECTED
Benzodiazepines: NOT DETECTED
Cocaine: NOT DETECTED
Opiates: NOT DETECTED
Tetrahydrocannabinol: NOT DETECTED

## 2020-09-15 LAB — LACTIC ACID, PLASMA: Lactic Acid, Venous: 1.9 mmol/L (ref 0.5–1.9)

## 2020-09-15 MED ORDER — SODIUM CHLORIDE 0.9 % IV BOLUS
1000.0000 mL | Freq: Once | INTRAVENOUS | Status: AC
  Administered 2020-09-15: 19:00:00 1000 mL via INTRAVENOUS

## 2020-09-15 NOTE — ED Notes (Signed)
Called [facility] to activate Code Stroke per Dr Silverio Lay. Called by Okey Dupre.

## 2020-09-15 NOTE — ED Triage Notes (Addendum)
BIB EMS for a sudden change in mental status. Normally alert and oriented, only disoriented to time, ambulatory. History of dementia. At 1500, after finish eating he suddenly complained of chills, he was saying "Im having chills". Wife said pt seemed unable to express himself and unable to follow command, EMS also said pt had a hard time following commands and notice a left facial droop that's resolve within 30 seconds. Pt is currently  disoriented to time and event. Per wife, pt is in his normal state now.

## 2020-09-15 NOTE — ED Notes (Signed)
Pt discharged via wheelchair with family. All questions and concerns addressed. No complaints at this time.   

## 2020-09-15 NOTE — Discharge Instructions (Signed)
Stay hydrated   See your doctor and repeat your kidney function test in a week   You don't have any obvious urinary infection or stroke or pneumonia right now   Return to ER if you have worse tremors, chills, fever, weakness, numbness, trouble speaking

## 2020-09-15 NOTE — Consult Note (Signed)
Neurology Consultation Reason for Consult: Concern for altered mental status Referring Physician: Silverio Lay, D  CC: Ch[place]  History is obtained from: Patient  HPI: Mackenzie Lia is a 74 y.o. male with a history of dementia, diabetes, previous stroke who presents with episodes of "chills.  The patient describes this as feeling severely cold, his wife describes bilateral tremoring with preserved consciousness.  No seizure-like activity.  He had some confusion, but his wife states that this is not unusual for him, and he has waxing/waning confusion frequently due to his dementia.  ROS: A 14 point ROS was performed and is negative except as noted in the HPI.  Past Medical History:  Diagnosis Date  . CVA (cerebral vascular accident) (HCC) 12/07/2018  . Diabetes mellitus without complication (HCC)   . Hypertension   . Lupus (HCC)   . Memory loss   . Murmur, cardiac   . Nocturia   . Seizure [facility])      Family History  Problem Relation Age of Onset  . Kidney disease Mother   . Dementia Father      Social History:  reports that he has never smoked. He has never used smokeless tobacco. He reports that he does not drink alcohol and does not use drugs.   Exam: Current vital signs: BP 140/74   Pulse 80   Temp 98.8 F (37.1 C) (Rectal)   Resp 18   SpO2 100%  Vital signs in last 24 hours: Temp:  [97.4 F (36.3 C)-98.8 F (37.1 C)] 98.8 F (37.1 C) (12/12 1754) Pulse Rate:  [70-80] 80 (12/12 2049) Resp:  [15-24] 18 (12/12 2049) BP: (118-140)/(63-74) 140/74 (12/12 2049) SpO2:  [100 %] 100 % (12/12 1845)   Physical Exam  Constitutional: Appears well-developed and well-nourished.  Psych: Affect appropriate to situation Eyes: No scleral injection HENT: No OP obstrucion MSK: no joint deformities.  Cardiovascular: Normal rate and regular rhythm.  Respiratory: Effort normal, non-labored breathing GI: Soft.  No distension. There is no tenderness.  Skin: WDI  Neuro: Mental  Status: Patient is awake, alert, oriented to person, place, month, year, and situation. Patient is able to give a clear and coherent history. No signs of aphasia or neglect Cranial Nerves: II: Visual Fields are full. Pupils are equal, round, and reactive to light.   III,IV, VI: EOMI without ptosis or diploplia.  V: Facial sensation is symmetric to temperature VII: Facial movement is symmetric.  VIII: hearing is intact to voice X: Uvula elevates symmetrically XI: Shoulder shrug is symmetric. XII: tongue is midline without atrophy or fasciculations.  Motor: Tone is normal. Bulk is normal. 5/5 strength was present in all four extremities.  Sensory: Sensation is symmetric to light touch and temperature in the arms and legs. Deep Tendon Reflexes: 2+ and symmetric in the biceps and patellae.  Cerebellar: FNF and HKS are intact bilaterally   I have reviewed labs in epic and the results pertinent to this consultation are: Creatinine 2.2  I have reviewed the images obtained: CT head-chronic strokes, some new since previous but no acute findings  Impression: 74 year old male presenting with "chills."  From discussion with his wife, it seems that the confusion is nothing out of the ordinary for him.  He is improving at this time.  With bilateral tremoring with preserved consciousness, I do not think that seizure is at all likely, but suspect it was rather shivering.  Recommendations: 1) please call if any further neurological questions remain.   Ritta Slot, MD [facility] [phone]  If 7pm- 7am, please page neurology on call as listed in [place].

## 2020-09-15 NOTE — ED Provider Notes (Signed)
[facility] EMERGENCY DEPARTMENT Provider Note   CSN: [id number] Arrival date & time: 09/15/20  1631     History No chief complaint on file.   David Irwin is a 74 y.o. male history of diabetes, previous stroke with left-sided weakness, hypertension here presenting with confusion and chills and altered mental status.  Patient is a very poor historian.  History as per wife.  She states that around 2:30 PM, he became acutely altered.  He also started having tremors and states that he has chills.  She told EMS that he was weaker than usual as well.  I was called in the room because he appears very confused and initial exam showed left facial droop and left arm weakness and left leg weakness so code stroke was activated.  By the time neurology saw the patient, patient is back to baseline and just complaining of chills.  No fevers at home.   The history is provided by the patient and a relative.       Past Medical History:  Diagnosis Date   CVA (cerebral vascular accident) (HCC) 12/07/2018   Diabetes mellitus without complication (HCC)    Hypertension    Lupus (HCC)    Memory loss    Murmur, cardiac    Nocturia    Seizure [facility])     Patient Active Problem List   Diagnosis Date Noted   Acute ischemic right MCA stroke (HCC) 12/07/2018   Stroke (HCC) 12/07/2018   Moderate dementia without behavioral disturbance (HCC) 07/27/2018   New onset seizure (HCC) 07/27/2018   Depression 12/16/2017   Essential hypertension 03/26/2017   Hyperlipidemia 03/26/2017   Cardiac murmur 03/26/2017   Mild cognitive impairment, so stated 05/01/2013   Memory loss 05/01/2013    Past Surgical History:  Procedure Laterality Date   APPENDECTOMY     EYE SURGERY         Family History  Problem Relation Age of Onset   Kidney disease Mother    Dementia Father     Social History   Tobacco Use   Smoking status: Never Smoker   Smokeless tobacco: Never  Used  Vaping Use   Vaping Use: Never used  Substance Use Topics   Alcohol use: No   Drug use: No    Home Medications Prior to Admission medications   Medication Sig Start Date End Date Taking? Authorizing Provider  acetaminophen (TYLENOL) 500 MG tablet Take 500 mg by mouth every 6 (six) hours as needed for headache (pain).    [provider]  clobetasol cream (TEMOVATE) 0.05 % Apply 1 application topically as needed (break out lupus reaction on scalp).    [provider]  clopidogrel (PLAVIX) 75 MG tablet Take 1 tablet (75 mg total) by mouth daily. 12/09/18   Richarda Overlie, MD  fluticasone (FLONASE) 50 MCG/ACT nasal spray Place 2 sprays into both nostrils at bedtime as needed for allergies or rhinitis.  07/06/18   [provider]  hydroxychloroquine (PLAQUENIL) 200 MG tablet Take 200 mg by mouth 2 (two) times daily.     [provider]  levETIRAcetam (KEPPRA XR) 500 MG 24 hr tablet Take 2 tablets (1,000 mg total) by mouth daily. 05/23/20   Lomax, Amy, NP  memantine (NAMENDA) 10 MG tablet Take 1 tablet (10 mg total) by mouth 2 (two) times daily. 08/27/20   Lomax, Amy, NP  Multiple Vitamins-Minerals (ALIVE MENS ENERGY PO) Take 1 tablet by mouth daily. Gummie    [provider]  omeprazole (PRILOSEC) 20 MG capsule Take 1 capsule (20 mg total) by mouth daily. 12/09/18 12/09/19  Richarda OverlieAbrol, Nayana, MD  polyvinyl alcohol (ARTIFICIAL TEARS) 1.4 % ophthalmic solution Place 1 drop into both eyes daily as needed for dry eyes.    [provider]  potassium chloride SA (K-DUR,KLOR-CON) 20 MEQ tablet Take 20 mEq by mouth daily.     [provider]  PRESCRIPTION MEDICATION Inhale into the lungs at bedtime. CPAP    [provider]  simvastatin (ZOCOR) 20 MG tablet Take 20 mg by mouth at bedtime.     [provider]  tamsulosin (FLOMAX) 0.4 MG CAPS Take 0.4 mg by mouth daily.  04/27/13   [provider]   telmisartan-hydrochlorothiazide (MICARDIS HCT) 40-12.5 MG tablet Take 2 tablets by mouth daily. 07/08/18   [provider]  vitamin B-12 (CYANOCOBALAMIN) 1000 MCG tablet Take 1,000 mcg by mouth daily.    [provider]    Allergies    Liraglutide and Shellfish allergy  Review of Systems   Review of Systems  Constitutional: Positive for chills.  All other systems reviewed and are negative.   Physical Exam Updated Vital Signs BP 118/69    Pulse 70    Temp 98.8 F (37.1 C) (Rectal)    Resp 15    SpO2 100%   Physical Exam Vitals and nursing note reviewed.  Constitutional:      Comments: Confused  HENT:     Head: Normocephalic and atraumatic.     Nose: Nose normal.     Mouth/Throat:     Mouth: Mucous membranes are dry.  Eyes:     Extraocular Movements: Extraocular movements intact.     Pupils: Pupils are equal, round, and reactive to light.  Cardiovascular:     Rate and Rhythm: Normal rate and regular rhythm.     Pulses: Normal pulses.     Heart sounds: Normal heart sounds.  Pulmonary:     Effort: Pulmonary effort is normal.     Breath sounds: Normal breath sounds.  Abdominal:     General: Abdomen is flat.     Palpations: Abdomen is soft.  Musculoskeletal:        General: Normal range of motion.     Cervical back: Normal range of motion.  Skin:    General: Skin is warm.  Neurological:     Comments: ? L facial droop, strength 4/5 L arm and leg   Psychiatric:     Comments: Tremors, agitated      ED Results / Procedures / Treatments   Labs (all labs ordered are listed, but only abnormal results are displayed) Labs Reviewed  CBC - Abnormal; Notable for the following components:      Result Value   RBC 3.47 (*)    Hemoglobin 11.4 (*)    HCT 35.0 (*)    MCV 100.9 (*)    All other components within normal limits  COMPREHENSIVE METABOLIC PANEL - Abnormal; Notable for the following components:   Glucose, Bld 142 (*)    BUN 32 (*)    Creatinine,  Ser 2.18 (*)    GFR, Estimated 31 (*)    All other components within normal limits  I-STAT CHEM 8, ED - Abnormal; Notable for the following components:   BUN 35 (*)    Creatinine, Ser 2.20 (*)    Glucose, Bld 108 (*)    Calcium, Ion 1.13 (*)    Hemoglobin 11.6 (*)    HCT 34.0 (*)  All other components within normal limits  CULTURE, BLOOD (ROUTINE X 2)  CULTURE, BLOOD (ROUTINE X 2)  URINE CULTURE  RESP PANEL BY RT-PCR (FLU A&B, COVID) ARPGX2  PROTIME-INR  APTT  DIFFERENTIAL  RAPID URINE DRUG SCREEN, HOSP PERFORMED  URINALYSIS, ROUTINE W REFLEX MICROSCOPIC  LACTIC ACID, PLASMA  ETHANOL  LACTIC ACID, PLASMA    EKG EKG Interpretation  Date/Time:  Sunday September 15 2020 16:39:45 EST Ventricular Rate:  69 PR Interval:    QRS Duration: 96 QT Interval:  399 QTC Calculation: 428 R Axis:   41 Text Interpretation: Sinus rhythm Probable left atrial enlargement Borderline T abnormalities, lateral leads No significant change since last tracing Confirmed by Richardean Canal ([id number]) on 09/15/2020 5:19:58 PM   Radiology DG Chest Port 1 View  Result Date: 09/15/2020 CLINICAL DATA:  Shortness of breath. EXAM: PORTABLE CHEST 1 VIEW COMPARISON:  07/25/2018 FINDINGS: Heart is normal in size. Stable mediastinal contours with aortic tortuosity. There is no pulmonary edema. Subsegmental atelectasis at the lung bases. No confluent consolidation. No pleural fluid or pneumothorax. No acute osseous abnormalities are seen. IMPRESSION: Subsegmental bibasilar atelectasis. Electronically Signed   By: Narda Rutherford M.D.   On: 09/15/2020 18:38   CT HEAD CODE STROKE WO CONTRAST  Result Date: 09/15/2020 CLINICAL DATA:  Code stroke. 74 year old male with confusion and left facial droop. EXAM: CT HEAD WITHOUT CONTRAST TECHNIQUE: Contiguous axial images were obtained from the base of the skull through the vertex without intravenous contrast. COMPARISON:  Brain MRI 12/07/2018.  Head CT 12/07/2018. FINDINGS:  Brain: Stable cerebral volume since last year. New although chronic appearing encephalomalacia in the left posterior parietal and occipital lobes (series 3, image 23). Sequelae of right thalamic lacunar infarct demonstrated on the prior MRI. New since last year but also chronic appearing right anterior MCA territory encephalomalacia involving the right middle frontal gyrus. Suggestion of mild regional ex vacuo ventricular enlargement (series 5, image 36). Elsewhere Patchy and confluent cerebral white matter hypodensity appears stable. Stable chronic dystrophic calcification in the right pons. No midline shift, ventriculomegaly, mass effect, evidence of mass lesion, intracranial hemorrhage or evidence of cortically based acute infarction. Vascular: Calcified atherosclerosis at the skull base. No suspicious intracranial vascular hyperdensity. Skull: No acute osseous abnormality identified. Sinuses/Orbits: Visualized paranasal sinuses and mastoids are stable and well pneumatized. Other: No acute orbit or scalp soft tissue finding. ASPECTS [facility] Stroke Program Early CT Score) Total score (0-10 with 10 being normal): 10 (chronic bilateral encephalomalacia suspected). IMPRESSION: 1. New since 2020 but chronic appearing infarcts in both the anterior right MCA and left MCA/PCA watershed areas. Stable underlying chronic small vessel disease. 2. No acute cortically based infarct or acute intracranial hemorrhage identified. ASPECTS 10. 3. These results were communicated to Dr. Amada Jupiter at 5:23 pm on 12/12/2021by text page via the [facility] messaging system. Electronically Signed   By: Odessa Fleming M.D.   On: 09/15/2020 17:24    Procedures Procedures (including critical care time)  Medications Ordered in ED Medications  sodium chloride 0.9 % bolus 1,000 mL (1,000 mLs Intravenous New Bag/Given 09/15/20 1833)    ED Course  I have reviewed the triage vital signs and the nursing notes.  Pertinent labs & imaging results  that were available during my care of the patient were reviewed by me and considered in my medical decision making (see chart for details).    MDM Rules/Calculators/A&P  Gabe Glace is a 74 y.o. male here with L sided weakness, confusion. Last normal was 2:30 pm. Code stroke activated. Dr. Amada Jupiter consulted and canceled code stroke since patient's deficit resolved. Has chills so will do sepsis workup   8:54 PM Labs unremarkable. WBC nl. Cr slightly elevated at 2.2, baseline around 1.6. Given IVF. UA nl, CXR clear. CT head/CT renal stone unremarkable. Tremors improved. I offered admission for observation vs close outpatient follow up to recheck BMP and wife states that he will be more confused in the hospital and will take him home and encourage him to drink more fluids   Final Clinical Impression(s) / ED Diagnoses Final diagnoses:  None    Rx / DC Orders ED Discharge Orders    None       Charlynne Pander, MD 09/15/20 2055

## 2020-09-16 LAB — URINE CULTURE: Culture: NO GROWTH

## 2020-09-17 ENCOUNTER — Other Ambulatory Visit: Payer: Self-pay | Admitting: Family Medicine

## 2020-09-17 ENCOUNTER — Telehealth: Payer: Self-pay | Admitting: Family Medicine

## 2020-09-17 MED ORDER — QUETIAPINE FUMARATE 25 MG PO TABS: 12.5000 mg | ORAL_TABLET | Freq: Every day | ORAL | 3 refills | Status: DC

## 2020-09-17 NOTE — Telephone Encounter (Signed)
I called wife.  Pt is about same relating to outbursts and sleep issue.  Sleeping 1-2 hours during day and not sleeping at night at all.  She stated the keppra 2000mg  po pm she thinks is the issue.  Will switch back to 1000mg  po bid.  memantine 10mg  po bid the same.  Uses [facility].  I relayed that mood and sleep issues worsen over time with dementia.  ok to use benadryl pm,  I relayed yes.  Any other recommendations?

## 2020-09-17 NOTE — Telephone Encounter (Signed)
im sorry that is correct, taking 2 tablets of the 500mg  (1000mg  daily).

## 2020-09-17 NOTE — Telephone Encounter (Signed)
Pt's wife Diron Haddon (on [place]) called, outburst getting worse, not able sleep at night. Can this be a side effect of the medication? What can be do? Would like a call from the nurse.

## 2020-09-17 NOTE — Telephone Encounter (Signed)
We had increased memantine a couple of months ago. If symptoms seem to be worse since, we could try to lower dose and reevaluate. Sometimes the mood and sleep issues worsen with dementia. I know he was seen 2 days ago for worsening confusion. Labs were ok with exception of him being dehydrated. Is he doing better from that visit?

## 2020-09-17 NOTE — Telephone Encounter (Signed)
Ok. I am not opposed to trying a really low dose of seroquel to help with sleep at night. I recommend 1/2 tablet nightly at bedtime. This can make him more sleepy and increase risks for falls so have her monitor closely. Levetiracetam can make him irritable. We may need to consider changing AED. If she wants to try to do 500mg  twice daily she can but I know she had a hard time with compliance last time. I am worried about breakthrough seizures if we reduce dose. If BID dosing doesn't help, we should switch AED.

## 2020-09-17 NOTE — Telephone Encounter (Signed)
I called wife of pt.  Relayed that ok to go back to taking keppra 500mg  po bid instead of 1000mg  po qhs.  See if makes difference to agitation/outbursts.  Please compliance is important for breakthru seizures.  seroquel 12.5mg  po qhs to help with outbursts and sleep.  [facility] as [facility] has all his meds there.  Will call back if questions.

## 2020-09-17 NOTE — Telephone Encounter (Signed)
He should only be taking levetiracetam ER 1000mg  daily (two 500mg  tablets). Has he taken more than that dose?

## 2020-09-20 LAB — CULTURE, BLOOD (ROUTINE X 2): Culture: NO GROWTH

## 2020-10-03 ENCOUNTER — Emergency Department (HOSPITAL_COMMUNITY)
Admission: EM | Admit: 2020-10-03 | Discharge: 2020-10-03 | Disposition: A | Discharge status: Home / Self Care | Payer: PPO | Attending: Emergency Medicine | Admitting: Emergency Medicine

## 2020-10-03 ENCOUNTER — Other Ambulatory Visit: Payer: Self-pay

## 2020-10-03 ENCOUNTER — Encounter (HOSPITAL_COMMUNITY): Payer: Self-pay

## 2020-10-03 DIAGNOSIS — T43591A Poisoning by other antipsychotics and neuroleptics, accidental (unintentional), initial encounter: Secondary | ICD-10-CM | POA: Diagnosis not present

## 2020-10-03 DIAGNOSIS — Z7902 Long term (current) use of antithrombotics/antiplatelets: Secondary | ICD-10-CM | POA: Insufficient documentation

## 2020-10-03 DIAGNOSIS — I959 Hypotension, unspecified: Secondary | ICD-10-CM | POA: Diagnosis not present

## 2020-10-03 DIAGNOSIS — I10 Essential (primary) hypertension: Secondary | ICD-10-CM | POA: Diagnosis not present

## 2020-10-03 DIAGNOSIS — R404 Transient alteration of awareness: Secondary | ICD-10-CM | POA: Diagnosis not present

## 2020-10-03 DIAGNOSIS — Z20822 Contact with and (suspected) exposure to covid-19: Secondary | ICD-10-CM | POA: Insufficient documentation

## 2020-10-03 DIAGNOSIS — Z79899 Other long term (current) drug therapy: Secondary | ICD-10-CM | POA: Diagnosis not present

## 2020-10-03 DIAGNOSIS — F0390 Unspecified dementia without behavioral disturbance: Secondary | ICD-10-CM | POA: Insufficient documentation

## 2020-10-03 DIAGNOSIS — E119 Type 2 diabetes mellitus without complications: Secondary | ICD-10-CM | POA: Insufficient documentation

## 2020-10-03 DIAGNOSIS — R451 Restlessness and agitation: Secondary | ICD-10-CM | POA: Diagnosis present

## 2020-10-03 DIAGNOSIS — Z8673 Personal history of transient ischemic attack (TIA), and cerebral infarction without residual deficits: Secondary | ICD-10-CM | POA: Diagnosis not present

## 2020-10-03 DIAGNOSIS — E7800 Pure hypercholesterolemia, unspecified: Secondary | ICD-10-CM | POA: Diagnosis not present

## 2020-10-03 DIAGNOSIS — R531 Weakness: Secondary | ICD-10-CM | POA: Diagnosis not present

## 2020-10-03 DIAGNOSIS — E1169 Type 2 diabetes mellitus with other specified complication: Secondary | ICD-10-CM | POA: Diagnosis not present

## 2020-10-03 DIAGNOSIS — K219 Gastro-esophageal reflux disease without esophagitis: Secondary | ICD-10-CM | POA: Diagnosis not present

## 2020-10-03 DIAGNOSIS — F329 Major depressive disorder, single episode, unspecified: Secondary | ICD-10-CM | POA: Diagnosis not present

## 2020-10-03 DIAGNOSIS — N183 Chronic kidney disease, stage 3 unspecified: Secondary | ICD-10-CM | POA: Diagnosis not present

## 2020-10-03 DIAGNOSIS — D649 Anemia, unspecified: Secondary | ICD-10-CM | POA: Diagnosis not present

## 2020-10-03 DIAGNOSIS — T50901A Poisoning by unspecified drugs, medicaments and biological substances, accidental (unintentional), initial encounter: Secondary | ICD-10-CM

## 2020-10-03 LAB — SARS CORONAVIRUS 2 (TAT 6-24 HRS): SARS Coronavirus 2: NEGATIVE

## 2020-10-03 NOTE — Discharge Instructions (Addendum)
Continue monitoring your husband's symptoms for the next hour. If he complains of being thirsty please allow him to drink. He may be slightly drowsy as well.  I would recommend not taking additional Seroquel tonight.  Please follow up with PCP tomorrow regarding ED visit today.   We have tested your husband for COVID given close exposure. We will call if the test returns positive. You can also log into MyChart and check the results that way.   Return to the ED for any worsening symptoms

## 2020-10-03 NOTE — ED Triage Notes (Signed)
Pt BIB EMS from home. Pt took 100mg  of Seroquel by accident, normally supposed to take 12.5mg . Pt mentation is baseline at this time. Pt has hx of dementia.  BP 78/48 CBG 176 99% RA 20G LFA NS BP 113/68

## 2020-10-03 NOTE — ED Provider Notes (Signed)
David Irwin Provider Note   CSN: [id number] Arrival date & time: 10/03/20  1127     History Chief Complaint  Patient presents with  . Hypotension  . Ingestion   LEVEL 5 CAVEAT - DEMENTIA  David BoozeGeorge Irwin is a 74 y.o. male with PMHx dementia, CVA, Diabetes, HTN, who presents to the ED via EMS for ingestion. Per EMS pt took 100 mg Seroquel by accident 10-15 minutes before they were called to the scene. Pt was found to be hypotensive at that time with a BP 78/48. He was given 500 CC fluid bolus with improvement in his BP to 113/67. Pt has no complaints at this time and is agitated that he is being asked questions. Per EMS pt is at his baseline.   Additional information obtained by wife via phone - reports that he had about 4-5 tablets of 12.5 mg in the bottle this morning. She noticed that the bottle was empty around 10:30 this morning and assumed he had just taken it. She states that he seemed unsteady of his feet while he was walking and more drowsy then normal prompting her to call EMS. She would also like pt to be tested for COVID at this time as she reports he has had a positive exposure on 12/27 to their granddaughter.    The history is provided by the patient and medical records.       Past Medical History:  Diagnosis Date  . CVA (cerebral vascular accident) (HCC) 12/07/2018  . Diabetes mellitus without complication (HCC)   . Hypertension   . Lupus (HCC)   . Memory loss   . Murmur, cardiac   . Nocturia   . Seizure [facility](HCC)     Patient Active Problem List   Diagnosis Date Noted  . Acute ischemic right MCA stroke (HCC) 12/07/2018  . Stroke (HCC) 12/07/2018  . Moderate dementia without behavioral disturbance (HCC) 07/27/2018  . New onset seizure (HCC) 07/27/2018  . Depression 12/16/2017  . Essential hypertension 03/26/2017  . Hyperlipidemia 03/26/2017  . Cardiac murmur 03/26/2017  . Mild cognitive impairment, so stated 05/01/2013  .  Memory loss 05/01/2013    Past Surgical History:  Procedure Laterality Date  . APPENDECTOMY    . EYE SURGERY         Family History  Problem Relation Age of Onset  . Kidney disease Mother   . Dementia Father     Social History   Tobacco Use  . Smoking status: Never Smoker  . Smokeless tobacco: Never Used  Vaping Use  . Vaping Use: Never used  Substance Use Topics  . Alcohol use: No  . Drug use: No    Home Medications Prior to Admission medications   Medication Sig Start Date End Date Taking? Authorizing Provider  acetaminophen (TYLENOL) 500 MG tablet Take 500 mg by mouth every 6 (six) hours as needed for headache (pain).    [provider]  ciprofloxacin (CIPRO) 500 MG tablet Take 500 mg by mouth every 12 (twelve) hours. 15 day supply 09/18/20   [provider]  clobetasol cream (TEMOVATE) 0.05 % Apply 1 application topically as needed (break out lupus reaction on scalp).    [provider]  clopidogrel (PLAVIX) 75 MG tablet Take 1 tablet (75 mg total) by mouth daily. 12/09/18   Richarda OverlieAbrol, Nayana, MD  fluticasone (FLONASE) 50 MCG/ACT nasal spray Place 2 sprays into both nostrils at bedtime as needed for allergies or rhinitis.  07/06/18   [provider]  hydroxychloroquine (PLAQUENIL) 200 MG tablet Take 200 mg by mouth 2 (two) times daily.     [provider]  levETIRAcetam (KEPPRA XR) 500 MG 24 hr tablet Take 2 tablets (1,000 mg total) by mouth daily. 05/23/20   Lomax, Amy, NP  memantine (NAMENDA) 10 MG tablet Take 1 tablet (10 mg total) by mouth 2 (two) times daily. 08/27/20   Lomax, Amy, NP  Multiple Vitamins-Minerals (ALIVE MENS ENERGY PO) Take 1 tablet by mouth daily. Gummie    [provider]  omeprazole (PRILOSEC) 20 MG capsule Take 1 capsule (20 mg total) by mouth daily. 12/09/18 12/09/19  Richarda Overlie, MD  polyvinyl alcohol (ARTIFICIAL TEARS) 1.4 % ophthalmic solution Place 1 drop into both eyes daily as needed for dry  eyes.    [provider]  potassium chloride SA (K-DUR,KLOR-CON) 20 MEQ tablet Take 20 mEq by mouth daily.     [provider]  PRESCRIPTION MEDICATION Inhale into the lungs at bedtime. CPAP    [provider]  QUEtiapine (SEROQUEL) 25 MG tablet Take 0.5 tablets (12.5 mg total) by mouth at bedtime. 09/17/20   Lomax, Amy, NP  simvastatin (ZOCOR) 20 MG tablet Take 20 mg by mouth at bedtime.     [provider]  tamsulosin (FLOMAX) 0.4 MG CAPS Take 0.4 mg by mouth daily.  04/27/13   [provider]  telmisartan-hydrochlorothiazide (MICARDIS HCT) 40-12.5 MG tablet Take 2 tablets by mouth daily. 07/08/18   [provider]  telmisartan-hydrochlorothiazide (MICARDIS HCT) 80-25 MG tablet Take 1 tablet by mouth every morning. 09/11/20   [provider]  vitamin B-12 (CYANOCOBALAMIN) 1000 MCG tablet Take 1,000 mcg by mouth daily.    [provider]    Allergies    Liraglutide and Shellfish allergy  Review of Systems   Review of Systems  Unable to perform ROS: Dementia  Constitutional: Negative for fever.    Physical Exam Updated Vital Signs BP 113/67 (BP Location: Right Arm)   Pulse 88   Temp 97.8 F (36.6 C) (Oral)   Resp 11   SpO2 100%   Physical Exam Vitals and nursing note reviewed.  Constitutional:      Appearance: He is not ill-appearing or diaphoretic.  HENT:     Head: Normocephalic and atraumatic.  Eyes:     Extraocular Movements: Extraocular movements intact.     Conjunctiva/sclera: Conjunctivae normal.     Pupils: Pupils are equal, round, and reactive to light.  Cardiovascular:     Rate and Rhythm: Normal rate and regular rhythm.     Pulses: Normal pulses.  Pulmonary:     Effort: Pulmonary effort is normal.     Breath sounds: Normal breath sounds. No wheezing, rhonchi or rales.  Abdominal:     Palpations: Abdomen is soft.     Tenderness: There is no abdominal tenderness. There is no guarding or  rebound.  Musculoskeletal:     Cervical back: Neck supple.  Skin:    General: Skin is warm and dry.  Neurological:     Mental Status: He is alert. Mental status is at baseline.     ED Results / Procedures / Treatments   Labs (all labs ordered are listed, but only abnormal results are displayed) Labs Reviewed  SARS CORONAVIRUS 2 (TAT 6-24 HRS)    EKG None  Radiology No results found.  Procedures Procedures (including critical care time)  Medications Ordered in ED Medications - No data to display  ED Course  I have reviewed the triage vital signs and the nursing notes.  Pertinent labs & imaging results that were available during my care of the patient were reviewed by me and considered in my medical decision making (see chart for details).  Clinical Course as of 10/03/20 1645  Thu Oct 03, 2020  1215 Rogue Bussing  [MV]    Clinical Course User Index [MV] Tanda Rockers, PA-C   MDM Rules/Calculators/A&P                          74 year old male who presents to the ED today via EMS after accidentally taking 100 mg around 10:30 AM this morning.  Wife reports that patient was unsteady on his feet and confused, she noticed the empty bottle and called EMS.  He was found to be hypotensive at 78/48 and was given 500 cc fluid bolus with improvement to 113/67.  On arrival to the ED vitals are stable.  Patient afebrile, nontachycardic nontachypneic.  Satting 100% on room air.  Patient in no acute distress.  He has a history of dementia and is at baseline.  He denies any symptoms at this time.  Discussed case with Okey Dupre RN with poison control who recommends that he be monitored for 6 hours, states that there is a reliable individual at home who can watch him that he can go home at this time and be watched for 6 hours otherwise he will need to stay in the ED and be watched for 6 hours.  She does not recommend any labs or EKG given it was only about 100 mg of Seroquel and patient takes 12.5 mg  typically daily.  He has not Seroquel nave.  Does not appear that wife is at home right now she is currently waiting to be tested for Covid.  Given this we will monitor him.  She would like patient to be tested as she reports they had a positive exposure to the granddaughter on 12/27.  Will obtain 6-hour Covid test.  Pt monitored in the ED for over 5 hours. Wife is here to take him home. Have recommended that she continue to monitor for the remaining 1 hour and give him water as indicated if he complains of feeling thirsty. Have recommended they follow up with PCP tomorrow and that wife manage his medications from now on. Recommend holding off on his regular dose of seroquel tonight. Wife is in agreement and pt stable for discharge home.   This note was prepared using Dragon voice recognition software and may include unintentional dictation errors due to the inherent limitations of voice recognition software.    Final Clinical Impression(s) / ED Diagnoses Final diagnoses:  Accidental overdose, initial encounter  Close exposure to COVID-19 virus    Rx / DC Orders ED Discharge Orders    None       Discharge Instructions     Continue monitoring your husband's symptoms for the next hour. If he complains of being thirsty please allow him to drink. He may be slightly drowsy as well.  I would recommend not taking additional Seroquel tonight.  Please follow up with PCP tomorrow regarding ED visit today.   We have tested your husband for COVID given close exposure. We will call if the test returns positive. You can also log into MyChart and check the results that way.   Return to the ED for any worsening symptoms       Tanda Rockers, PA-C  10/03/20 1645    Arby Barrette, MD 10/03/20 1708

## 2020-10-08 DIAGNOSIS — U071 COVID-19: Secondary | ICD-10-CM | POA: Diagnosis not present

## 2020-10-08 DIAGNOSIS — R3911 Hesitancy of micturition: Secondary | ICD-10-CM | POA: Diagnosis not present

## 2020-10-08 DIAGNOSIS — R251 Tremor, unspecified: Secondary | ICD-10-CM | POA: Diagnosis not present

## 2020-10-08 DIAGNOSIS — Z20828 Contact with and (suspected) exposure to other viral communicable diseases: Secondary | ICD-10-CM | POA: Diagnosis not present

## 2020-10-10 MED ORDER — LEVETIRACETAM ER 500 MG PO TB24: ORAL_TABLET | ORAL | 3 refills | Status: DC

## 2020-10-10 NOTE — Telephone Encounter (Signed)
[email or web address] is asking for a call from Sandy,RN re: pt's Levetiracetam

## 2020-10-10 NOTE — Telephone Encounter (Signed)
Spoke to [place] at [facility].  Pt tolerating the 500mg  po bid better.  Need new prescription stating taking it this way. Done.

## 2020-10-10 NOTE — Addendum Note (Signed)
Addended by: Hermenia Fiscal S on: 10/10/2020 11:04 AM   Modules accepted: Orders

## 2020-10-31 DIAGNOSIS — K219 Gastro-esophageal reflux disease without esophagitis: Secondary | ICD-10-CM | POA: Diagnosis not present

## 2020-10-31 DIAGNOSIS — E1169 Type 2 diabetes mellitus with other specified complication: Secondary | ICD-10-CM | POA: Diagnosis not present

## 2020-10-31 DIAGNOSIS — D649 Anemia, unspecified: Secondary | ICD-10-CM | POA: Diagnosis not present

## 2020-10-31 DIAGNOSIS — E7800 Pure hypercholesterolemia, unspecified: Secondary | ICD-10-CM | POA: Diagnosis not present

## 2020-10-31 DIAGNOSIS — F0390 Unspecified dementia without behavioral disturbance: Secondary | ICD-10-CM | POA: Diagnosis not present

## 2020-10-31 DIAGNOSIS — F329 Major depressive disorder, single episode, unspecified: Secondary | ICD-10-CM | POA: Diagnosis not present

## 2020-10-31 DIAGNOSIS — I10 Essential (primary) hypertension: Secondary | ICD-10-CM | POA: Diagnosis not present

## 2020-10-31 DIAGNOSIS — N183 Chronic kidney disease, stage 3 unspecified: Secondary | ICD-10-CM | POA: Diagnosis not present

## 2020-11-06 DIAGNOSIS — R634 Abnormal weight loss: Secondary | ICD-10-CM | POA: Diagnosis not present

## 2020-11-06 DIAGNOSIS — I10 Essential (primary) hypertension: Secondary | ICD-10-CM | POA: Diagnosis not present

## 2020-11-06 DIAGNOSIS — N183 Chronic kidney disease, stage 3 unspecified: Secondary | ICD-10-CM | POA: Diagnosis not present

## 2020-11-06 DIAGNOSIS — E46 Unspecified protein-calorie malnutrition: Secondary | ICD-10-CM | POA: Diagnosis not present

## 2020-11-07 ENCOUNTER — Other Ambulatory Visit: Payer: Self-pay | Admitting: Family Medicine

## 2020-11-07 DIAGNOSIS — R634 Abnormal weight loss: Secondary | ICD-10-CM

## 2020-11-20 ENCOUNTER — Other Ambulatory Visit: Payer: Self-pay | Admitting: Family Medicine

## 2020-11-20 DIAGNOSIS — R634 Abnormal weight loss: Secondary | ICD-10-CM

## 2020-11-21 DIAGNOSIS — R1310 Dysphagia, unspecified: Secondary | ICD-10-CM | POA: Diagnosis not present

## 2020-11-21 DIAGNOSIS — K5900 Constipation, unspecified: Secondary | ICD-10-CM | POA: Diagnosis not present

## 2020-11-21 DIAGNOSIS — R634 Abnormal weight loss: Secondary | ICD-10-CM | POA: Diagnosis not present

## 2020-11-21 DIAGNOSIS — Z86010 Personal history of colonic polyps: Secondary | ICD-10-CM | POA: Diagnosis not present

## 2020-11-25 ENCOUNTER — Ambulatory Visit
Admission: RE | Admit: 2020-11-25 | Discharge: 2020-11-25 | Disposition: A | Discharge status: Home / Self Care | Payer: PPO | Source: Ambulatory Visit | Attending: Family Medicine | Admitting: Family Medicine

## 2020-11-25 ENCOUNTER — Other Ambulatory Visit: Payer: Self-pay

## 2020-11-25 ENCOUNTER — Other Ambulatory Visit: Payer: Self-pay | Admitting: Family Medicine

## 2020-11-25 ENCOUNTER — Ambulatory Visit
Admission: RE | Admit: 2020-11-25 | Discharge: 2020-11-25 | Disposition: A | Discharge status: Home / Self Care | Payer: HMO | Source: Ambulatory Visit | Attending: Family Medicine | Admitting: Family Medicine

## 2020-11-25 DIAGNOSIS — R634 Abnormal weight loss: Secondary | ICD-10-CM | POA: Diagnosis not present

## 2020-11-25 DIAGNOSIS — K449 Diaphragmatic hernia without obstruction or gangrene: Secondary | ICD-10-CM | POA: Diagnosis not present

## 2020-11-25 DIAGNOSIS — R0602 Shortness of breath: Secondary | ICD-10-CM | POA: Diagnosis not present

## 2020-11-25 DIAGNOSIS — K5730 Diverticulosis of large intestine without perforation or abscess without bleeding: Secondary | ICD-10-CM | POA: Diagnosis not present

## 2020-11-27 DIAGNOSIS — E1169 Type 2 diabetes mellitus with other specified complication: Secondary | ICD-10-CM | POA: Diagnosis not present

## 2020-11-28 DIAGNOSIS — E7800 Pure hypercholesterolemia, unspecified: Secondary | ICD-10-CM | POA: Diagnosis not present

## 2020-11-28 DIAGNOSIS — E1169 Type 2 diabetes mellitus with other specified complication: Secondary | ICD-10-CM | POA: Diagnosis not present

## 2020-11-28 DIAGNOSIS — F329 Major depressive disorder, single episode, unspecified: Secondary | ICD-10-CM | POA: Diagnosis not present

## 2020-11-28 DIAGNOSIS — D649 Anemia, unspecified: Secondary | ICD-10-CM | POA: Diagnosis not present

## 2020-11-28 DIAGNOSIS — N183 Chronic kidney disease, stage 3 unspecified: Secondary | ICD-10-CM | POA: Diagnosis not present

## 2020-11-28 DIAGNOSIS — I10 Essential (primary) hypertension: Secondary | ICD-10-CM | POA: Diagnosis not present

## 2020-11-28 DIAGNOSIS — F0390 Unspecified dementia without behavioral disturbance: Secondary | ICD-10-CM | POA: Diagnosis not present

## 2020-11-28 DIAGNOSIS — K219 Gastro-esophageal reflux disease without esophagitis: Secondary | ICD-10-CM | POA: Diagnosis not present

## 2020-12-02 DIAGNOSIS — E1169 Type 2 diabetes mellitus with other specified complication: Secondary | ICD-10-CM | POA: Diagnosis not present

## 2020-12-10 ENCOUNTER — Telehealth (HOSPITAL_COMMUNITY): Payer: Self-pay

## 2020-12-10 ENCOUNTER — Other Ambulatory Visit (HOSPITAL_COMMUNITY): Payer: Self-pay

## 2020-12-10 DIAGNOSIS — R1310 Dysphagia, unspecified: Secondary | ICD-10-CM

## 2020-12-24 ENCOUNTER — Ambulatory Visit (HOSPITAL_COMMUNITY)
Admission: RE | Admit: 2020-12-24 | Discharge: 2020-12-24 | Disposition: A | Discharge status: Home / Self Care | Payer: HMO | Source: Ambulatory Visit | Attending: Gastroenterology | Admitting: Gastroenterology

## 2020-12-24 ENCOUNTER — Other Ambulatory Visit: Payer: Self-pay

## 2020-12-24 DIAGNOSIS — R1310 Dysphagia, unspecified: Secondary | ICD-10-CM | POA: Insufficient documentation

## 2020-12-24 DIAGNOSIS — T17320A Food in larynx causing asphyxiation, initial encounter: Secondary | ICD-10-CM | POA: Diagnosis not present

## 2020-12-25 DIAGNOSIS — I739 Peripheral vascular disease, unspecified: Secondary | ICD-10-CM | POA: Diagnosis not present

## 2020-12-25 DIAGNOSIS — B351 Tinea unguium: Secondary | ICD-10-CM | POA: Diagnosis not present

## 2021-01-01 DIAGNOSIS — F329 Major depressive disorder, single episode, unspecified: Secondary | ICD-10-CM | POA: Diagnosis not present

## 2021-01-01 DIAGNOSIS — K219 Gastro-esophageal reflux disease without esophagitis: Secondary | ICD-10-CM | POA: Diagnosis not present

## 2021-01-01 DIAGNOSIS — D649 Anemia, unspecified: Secondary | ICD-10-CM | POA: Diagnosis not present

## 2021-01-01 DIAGNOSIS — E1169 Type 2 diabetes mellitus with other specified complication: Secondary | ICD-10-CM | POA: Diagnosis not present

## 2021-01-01 DIAGNOSIS — I10 Essential (primary) hypertension: Secondary | ICD-10-CM | POA: Diagnosis not present

## 2021-01-01 DIAGNOSIS — N183 Chronic kidney disease, stage 3 unspecified: Secondary | ICD-10-CM | POA: Diagnosis not present

## 2021-01-01 DIAGNOSIS — F0390 Unspecified dementia without behavioral disturbance: Secondary | ICD-10-CM | POA: Diagnosis not present

## 2021-01-01 DIAGNOSIS — E7800 Pure hypercholesterolemia, unspecified: Secondary | ICD-10-CM | POA: Diagnosis not present

## 2021-01-02 DIAGNOSIS — E1169 Type 2 diabetes mellitus with other specified complication: Secondary | ICD-10-CM | POA: Diagnosis not present

## 2021-01-27 DIAGNOSIS — R430 Anosmia: Secondary | ICD-10-CM | POA: Diagnosis not present

## 2021-01-27 DIAGNOSIS — R413 Other amnesia: Secondary | ICD-10-CM | POA: Diagnosis not present

## 2021-01-27 DIAGNOSIS — M329 Systemic lupus erythematosus, unspecified: Secondary | ICD-10-CM | POA: Diagnosis not present

## 2021-01-27 DIAGNOSIS — M150 Primary generalized (osteo)arthritis: Secondary | ICD-10-CM | POA: Diagnosis not present

## 2021-01-27 DIAGNOSIS — M5136 Other intervertebral disc degeneration, lumbar region: Secondary | ICD-10-CM | POA: Diagnosis not present

## 2021-01-27 DIAGNOSIS — N1832 Chronic kidney disease, stage 3b: Secondary | ICD-10-CM | POA: Diagnosis not present

## 2021-01-30 DIAGNOSIS — F329 Major depressive disorder, single episode, unspecified: Secondary | ICD-10-CM | POA: Diagnosis not present

## 2021-01-30 DIAGNOSIS — N183 Chronic kidney disease, stage 3 unspecified: Secondary | ICD-10-CM | POA: Diagnosis not present

## 2021-01-30 DIAGNOSIS — K219 Gastro-esophageal reflux disease without esophagitis: Secondary | ICD-10-CM | POA: Diagnosis not present

## 2021-01-30 DIAGNOSIS — E1169 Type 2 diabetes mellitus with other specified complication: Secondary | ICD-10-CM | POA: Diagnosis not present

## 2021-01-30 DIAGNOSIS — D649 Anemia, unspecified: Secondary | ICD-10-CM | POA: Diagnosis not present

## 2021-01-30 DIAGNOSIS — E7800 Pure hypercholesterolemia, unspecified: Secondary | ICD-10-CM | POA: Diagnosis not present

## 2021-01-30 DIAGNOSIS — F0390 Unspecified dementia without behavioral disturbance: Secondary | ICD-10-CM | POA: Diagnosis not present

## 2021-01-30 DIAGNOSIS — I10 Essential (primary) hypertension: Secondary | ICD-10-CM | POA: Diagnosis not present

## 2021-01-31 DIAGNOSIS — E1169 Type 2 diabetes mellitus with other specified complication: Secondary | ICD-10-CM | POA: Diagnosis not present

## 2021-02-11 DIAGNOSIS — Z79899 Other long term (current) drug therapy: Secondary | ICD-10-CM | POA: Diagnosis not present

## 2021-02-11 DIAGNOSIS — H353132 Nonexudative age-related macular degeneration, bilateral, intermediate dry stage: Secondary | ICD-10-CM | POA: Diagnosis not present

## 2021-02-11 DIAGNOSIS — H524 Presbyopia: Secondary | ICD-10-CM | POA: Diagnosis not present

## 2021-02-11 DIAGNOSIS — M3210 Systemic lupus erythematosus, organ or system involvement unspecified: Secondary | ICD-10-CM | POA: Diagnosis not present

## 2021-02-11 DIAGNOSIS — E119 Type 2 diabetes mellitus without complications: Secondary | ICD-10-CM | POA: Diagnosis not present

## 2021-02-17 DIAGNOSIS — R3915 Urgency of urination: Secondary | ICD-10-CM | POA: Diagnosis not present

## 2021-02-17 DIAGNOSIS — N401 Enlarged prostate with lower urinary tract symptoms: Secondary | ICD-10-CM | POA: Diagnosis not present

## 2021-02-24 NOTE — Progress Notes (Signed)
Chief Complaint  Patient presents with  . Follow-up    RM 1, w/ wife [name]. No SZ like activity, pts wife states his memory has gotten worse, memory becomes worse when sugar is low. Reports no major falls. Pts wife has questions regarding memantine not working, should he continue taking. Reports poor posture when sitting.      HISTORY OF PRESENT ILLNESS: 02/25/21 ALL: David StallionGeorge returns for follow up for memory loss and seizures. We continued memantine 10mg  BID and changes levetiracetam XR from 500mg  BID to 1000mg  daily at last visit in 08/2020 due to concerns of sleepiness. His wife called with concerns of increased agitation and irritability in 09/2020. Levetiracetam XR dose resumed at 500mg  BID. Since, he has done fairly well. [facility]. He is picked up Monday thru Friday by [facility] and at the center from 8:30 to 3. He seems to be engaging with staff and other patients. He is sleeping well. He is eating better. He has gained 9 pounds. Memory still waxes and wanes.    He is followed regularly by PCP. He continues Plavix and simvastatin. No stroke like symptoms.    08/27/2020 ALL:  David Irwin is a 75 y.o. male here today for follow up for memory loss and seizures. We increased memantine to 10mg  BID and switched levetiracetam to 1000mg  XR dosing. He has tolerated medications well. He has not had any new seizure like events. He is trying to walk a few minutes every day with his wife. He remains sleepy during the day. Wife would like to give levetiracetam at night, now giving at 9am. He has completed PT. No falls. He is followed regularly by PCP.    HISTORY (copied from previous note)  David BoozeGeorge Irwin is a 75 y.o. male here today for follow up for seizures and memory loss. His wife presents with him today and aids in history. She reports that he has had two seizures since last being seen in 02/2020. She reports one event on 7/21 and  another on 8/17 where he was unresponsive and starring off. She reports that he was very tired following event and reported feeling like he was floating. No tongue injury, incontinence or shaking/jerking. She reports that he is eating and drinking well. She denies missed doses of medication.  [name] does report that timing of doses are inconsistent.  It depends on what time he wakes up.  She feels memory is about the same. No changes. He is tolerating Namenda 5mg  BID.   He is participating in PT. Last note reports that therapy was extended as he had made improvement but had not yet reached his HEP goals. Wife feels that he is more active at home since he has been in PT. He continues close follow up with PCP for stroke prevention.    HISTORY: (copied from my note on 02/15/2020)  David HindGeorge Russellis a 74 y.o.malehere today for follow up for seizures and memory. He continues levetiracetam 500mg  BID. No recent seizures. He is also taking Namenda 5mg  BID.He has not noted any benefit. Increased doses caused sleepiness. His wife hasn't noted any significant improvements. She feels that she is having to help him more with getting bathed and dressed. He has dentures and his wife usually cleans those. He has more difficulty standing. He reports that he feels weaker. He has difficulty standing for long periods of time . His wife feels that he is shuffling his feet. He has  been slower in movements. No tremor. He is using his walker at home. Some days he can walk without it. No falls. Appetites is good. He has stopped drinking as much coffee and eating peanut butter sandwiches. He has lost 13 pounds in 8 months following diet changes. He does not like to drink water.   He is s/p right thalamic stroke and left frontal punctate infarct, 12/2018. He continues close follow up with PCP. He continues Plavix daily. BP has been good. On simvastatin 20mg . DM is well controlled. Last A1C 7. He feels that mood is fairly  stable. He is quite and withdrawn in the office today. His wife reports that he used to be very active but since his stroke he has been sedentary and withdrawn. She feels that the pandemic has caused worsening. He does seem much more alert and energized when he gets to see friends or family. He was taking Zoloft in 2019 but was not well tolerated.    REVIEW OF SYSTEMS: Out of a complete 14 system review of symptoms, the patient complains only of the following symptoms, fatigue, lack of motivation and all other reviewed systems are negative.   ALLERGIES: Allergies  Allergen Reactions  . Liraglutide Other (See Comments)    hallucinations   . Shellfish Allergy Hives     HOME MEDICATIONS: Outpatient Medications Prior to Visit  Medication Sig Dispense Refill  . acetaminophen (TYLENOL) 500 MG tablet Take 500 mg by mouth every 6 (six) hours as needed for headache (pain).    . ciprofloxacin (CIPRO) 500 MG tablet Take 500 mg by mouth every 12 (twelve) hours. 15 day supply    . clobetasol cream (TEMOVATE) 0.05 % Apply 1 application topically as needed (break out lupus reaction on scalp).    . clopidogrel (PLAVIX) 75 MG tablet Take 1 tablet (75 mg total) by mouth daily. 30 tablet 11  . fluticasone (FLONASE) 50 MCG/ACT nasal spray Place 2 sprays into both nostrils at bedtime as needed for allergies or rhinitis.   5  . hydroxychloroquine (PLAQUENIL) 200 MG tablet Take 200 mg by mouth 2 (two) times daily.     2020 levETIRAcetam (KEPPRA XR) 500 MG 24 hr tablet Take one tablet by mouth twice daily. 180 tablet 3  . memantine (NAMENDA) 10 MG tablet Take 1 tablet (10 mg total) by mouth 2 (two) times daily. 180 tablet 3  . Multiple Vitamins-Minerals (ALIVE MENS ENERGY PO) Take 1 tablet by mouth daily. Gummie    . polyvinyl alcohol (LIQUIFILM TEARS) 1.4 % ophthalmic solution Place 1 drop into both eyes daily as needed for dry eyes.    . potassium chloride SA (K-DUR,KLOR-CON) 20 MEQ tablet Take 20 mEq by mouth  daily.     Marland Kitchen PRESCRIPTION MEDICATION Inhale into the lungs at bedtime. CPAP    . QUEtiapine (SEROQUEL) 25 MG tablet Take 0.5 tablets (12.5 mg total) by mouth at bedtime. 45 tablet 3  . simvastatin (ZOCOR) 20 MG tablet Take 20 mg by mouth at bedtime.     . tamsulosin (FLOMAX) 0.4 MG CAPS Take 0.4 mg by mouth daily.     Marland Kitchen telmisartan-hydrochlorothiazide (MICARDIS HCT) 40-12.5 MG tablet Take 1 tablet by mouth daily at 12 noon.    . vitamin B-12 (CYANOCOBALAMIN) 1000 MCG tablet Take 1,000 mcg by mouth daily.    Marland Kitchen omeprazole (PRILOSEC) 20 MG capsule Take 1 capsule (20 mg total) by mouth daily. 30 capsule 1  . telmisartan-hydrochlorothiazide (MICARDIS HCT) 40-12.5 MG tablet Take 2 tablets  by mouth daily.  4   No facility-administered medications prior to visit.     PAST MEDICAL HISTORY: Past Medical History:  Diagnosis Date  . CVA (cerebral vascular accident) (HCC) 12/07/2018  . Diabetes mellitus without complication (HCC)   . Hypertension   . Lupus (HCC)   . Memory loss   . Murmur, cardiac   . Nocturia   . Seizure (HCC)      PAST SURGICAL HISTORY: Past Surgical History:  Procedure Laterality Date  . APPENDECTOMY    . EYE SURGERY       FAMILY HISTORY: Family History  Problem Relation Age of Onset  . Kidney disease Mother   . Dementia Father      SOCIAL HISTORY: Social History   Socioeconomic History  . Marital status: Married    Spouse name: [name]  . Number of children: 2  . Years of education: 12th  . Highest education level: Not on file  Occupational History  . Occupation: Retired  Tobacco Use  . Smoking status: Never Smoker  . Smokeless tobacco: Never Used  Vaping Use  . Vaping Use: Never used  Substance and Sexual Activity  . Alcohol use: No  . Drug use: No  . Sexual activity: Not on file  Other Topics Concern  . Not on file  Social History Narrative   Patient lives at home with spouse.   Caffeine Use: 1-2 cups daily   12th grade   Social  Determinants of Health   Financial Resource Strain: Not on file  Food Insecurity: Not on file  Transportation Needs: Not on file  Physical Activity: Not on file  Stress: Not on file  Social Connections: Not on file  Intimate Partner Violence: Not on file      PHYSICAL EXAM  Vitals:   02/25/21 1055  BP: (!) 149/67  Pulse: 64  Weight: 179 lb 12.8 oz (81.6 kg)  Height: 5\' 9"  (1.753 m)   Body mass index is 26.55 kg/m.   Generalized: Well developed, in no acute distress  Cardiology: normal rate and rhythm, no murmur auscultated  Respiratory: clear to auscultation bilaterally    Neurological examination  Mentation: Alert, oriented to season and year, place, and most history taking. Follows all commands speech and language fluent Cranial nerve II-XII: Pupils were equal round reactive to light. Extraocular movements were full, visual field were full on confrontational test. Facial sensation and strength were normal. Head turning and shoulder shrug  were normal and symmetric. Motor: The motor testing reveals 4+/5 over 5 strength of all 4 extremities. Good symmetric motor tone is noted throughout. No tremor noted, finger taps and toe taps normal. Unable to roll arms, no cogwheel rigidity noted.  Sensory: Sensory testing is intact to soft touch on all 4 extremities. No evidence of extinction is noted.  Coordination: Cerebellar testing reveals reduced finger-nose-finger and heel-to-shin bilaterally.  Gait and station: not assessed today as he is in wheelchair.     DIAGNOSTIC DATA (LABS, IMAGING, TESTING) - I reviewed patient records, labs, notes, testing and imaging myself where available.  Lab Results  Component Value Date   WBC 5.0 09/15/2020   HGB 11.6 (L) 09/15/2020   HCT 34.0 (L) 09/15/2020   MCV 100.9 (H) 09/15/2020   PLT 190 09/15/2020      Component Value Date/Time   NA 142 09/15/2020 1816   K 4.2 09/15/2020 1816   CL 108 09/15/2020 1816   CO2 24 09/15/2020 1724    GLUCOSE 108 (H)  09/15/2020 1816   BUN 35 (H) 09/15/2020 1816   CREATININE 2.20 (H) 09/15/2020 1816   CALCIUM 9.1 09/15/2020 1724   PROT 7.1 09/15/2020 1724   ALBUMIN 3.8 09/15/2020 1724   AST 22 09/15/2020 1724   ALT 18 09/15/2020 1724   ALKPHOS 48 09/15/2020 1724   BILITOT 0.9 09/15/2020 1724   GFRNONAA 31 (L) 09/15/2020 1724   GFRAA 53 (L) 12/07/2018 2327   Lab Results  Component Value Date   CHOL 119 12/08/2018   HDL 34 (L) 12/08/2018   LDLCALC 74 12/08/2018   TRIG 54 12/08/2018   CHOLHDL 3.5 12/08/2018   Lab Results  Component Value Date   HGBA1C 8.5 (H) 12/08/2018   No results found for: VITAMINB12 No results found for: TSH    ASSESSMENT AND PLAN  75 y.o. year old male  has a past medical history of CVA (cerebral vascular accident) (HCC) (12/07/2018), Diabetes mellitus without complication (HCC), Hypertension, Lupus (HCC), Memory loss, Murmur, cardiac, Nocturia, and Seizure (HCC). here with   Seizure disorder (HCC)  Moderate dementia without behavioral disturbance (HCC)  Right thalamic stroke (HCC)  Arvel is doing fairly well, today. He seems more active and engaged now that he is attending [facility]. I have encouraged him to continue working with the center. He will continue memantine 10mg  and levetiracetam XR 500mg  twice daily. Fall precautions reviewed.  He should use his walker at all times.  He will continue close follow-up with Dr. for stroke prevention.  Continue simvastatin and Plavix.  He will follow-up with in 1 year, sooner if needed.  He and his wife both verbalized understanding and agreement with this plan.     Tenny Craw, MSN, FNP-C 02/25/2021, 11:08 AM  [facility] [address], [address] [place], [address] [place] [phone]

## 2021-02-24 NOTE — Patient Instructions (Signed)
Below is our plan:  We will continue levetiracetam 500mg  twice daily and memantine 10mg  twice daily. Continue regular physical and mental activity.   Please make sure you are staying well hydrated. I recommend 50-60 ounces daily. Well balanced diet and regular exercise encouraged. Consistent sleep schedule with 6-8 hours recommended.   Please continue follow up with care team as directed.   Follow up with me in 1 year   You may receive a survey regarding today's visit. I encourage you to leave honest feed back as I do use this information to improve patient care. Thank you for seeing me today!    Management of Memory Problems   There are some general things you can do to help manage your memory problems.  Your memory may not in fact recover, but by using techniques and strategies you will be able to manage your memory difficulties better.   1)  Establish a routine. ? Try to establish and then stick to a regular routine.  By doing this, you will get used to what to expect and you will reduce the need to rely on your memory.  Also, try to do things at the same time of day, such as taking your medication or checking your calendar first thing in the morning. ? Think about think that you can do as a part of a regular routine and make a list.  Then enter them into a daily planner to remind you.  This will help you establish a routine.   2)  Organize your environment. ? Organize your environment so that it is uncluttered.  Decrease visual stimulation.  Place everyday items such as keys or cell phone in the same place every day (ie.  Basket next to front door) ? Use post it notes with a brief message to yourself (ie. Turn off light, lock the door) ? Use labels to indicate where things go (ie. Which cupboards are for food, dishes, etc.) ? Keep a notepad and pen by the telephone to take messages   3)  Memory Aids ? A diary or journal/notebook/daily planner ? Making a list (shopping list, chore  list, to do list that needs to be done) ? Using an alarm as a reminder (kitchen timer or cell phone alarm) ? Using cell phone to store information (Notes, Calendar, Reminders) ? Calendar/White board placed in a prominent position ? Post-it notes   In order for memory aids to be useful, you need to have good habits.  It's no good remembering to make a note in your journal if you don't remember to look in it.  Try setting aside a certain time of day to look in journal.   4)  Improving mood and managing fatigue. 1. There may be other factors that contribute to memory difficulties.  Factors, such as anxiety, depression and tiredness can affect memory.  Regular gentle exercise can help improve your mood and give you more energy.  Simple relaxation techniques may help relieve symptoms of anxiety  Try to get back to completing activities or hobbies you enjoyed doing in the past.  Learn to pace yourself through activities to decrease fatigue.  Find out about some local support groups where you can share experiences with others.  Try and achieve 7-8 hours of sleep at night.   Seizure, Adult A seizure is a sudden burst of abnormal electrical and chemical activity in the brain. Seizures usually last from 30 seconds to 2 minutes.  What are the causes? Common causes  of this condition include:  Fever or infection.  Problems that affect the brain. These may include: ? A brain or head injury. ? Bleeding in the brain. ? A brain tumor.  Low levels of blood sugar or salt.  Kidney problems or liver problems.  Conditions that are passed from parent to child (are inherited).  Problems with a substance, such as: ? Having a reaction to a drug or a medicine. ? Stopping the use of a substance all of a sudden (withdrawal).  A stroke.  Disorders that affect how you develop. Sometimes, the cause may not be known.  What increases the risk?  Having someone in your family who has epilepsy. In  this condition, seizures happen again and again over time. They have no clear cause.  Having had a tonic-clonic seizure before. This type of seizure causes you to: ? Tighten the muscles of the whole body. ? Lose consciousness.  Having had a head injury or strokes before.  Having had a lack of oxygen at birth. What are the signs or symptoms? There are many types of seizures. The symptoms vary depending on the type of seizure you have. Symptoms during a seizure  Shaking that you cannot control (convulsions) with fast, jerky movements of muscles.  Stiffness of the body.  Breathing problems.  Feeling mixed up (confused).  Staring or not responding to sound or touch.  Head nodding.  Eyes that blink, flutter, or move fast.  Drooling, grunting, or making clicking sounds with your mouth  Losing control of when you pee or poop. Symptoms before a seizure  Feeling afraid, nervous, or worried.  Feeling like you may vomit.  Feeling like: ? You are moving when you are not. ? Things around you are moving when they are not.  Feeling like you saw or heard something before (dj vu).  Odd tastes or smells.  Changes in how you see. You may see flashing lights or spots. Symptoms after a seizure  Feeling confused.  Feeling sleepy.  Headache.  Sore muscles. How is this treated? If your seizure stops on its own, you will not need treatment. If your seizure lasts longer than 5 minutes, you will normally need treatment. Treatment may include:  Medicines given through an IV tube.  Avoiding things, such as medicines, that are known to cause your seizures.  Medicines to prevent seizures.  A device to prevent or control seizures.  Surgery.  A diet low in carbohydrates and high in fat (ketogenic diet). Follow these instructions at home: Medicines  Take over-the-counter and prescription medicines only as told by your doctor.  Avoid foods or drinks that may keep your medicine  from working, such as alcohol. Activity  Follow instructions about driving, swimming, or doing things that would be dangerous if you had another seizure. Wait until your doctor says it is safe for you to do these things.  If you live in the [place] ask your local department of motor vehicles when you can drive.  Get a lot of rest. Teaching others  Teach friends and family what to do when you have a seizure. They should: ? Help you get down to the ground. ? Protect your head and body. ? Loosen any clothing around your neck. ? Turn you on your side. ? Know whether or not you need emergency care. ? Stay with you until you are better.  Also, tell them what not to do if you have a seizure. Tell them: ? They should not hold  you down. ? They should not put anything in your mouth.   General instructions  Avoid anything that gives you seizures.  Keep a seizure diary. Write down: ? What you remember about each seizure. ? What you think caused each seizure.  Keep all follow-up visits. Contact a doctor if:  You have another seizure or seizures. Call the doctor each time you have a seizure.  The pattern of your seizures changes.  You keep having seizures with treatment.  You have symptoms of being sick or having an infection.  You are not able to take your medicine. Get help right away if:  You have any of these problems: ? A seizure that lasts longer than 5 minutes. ? Many seizures in a row and you do not feel better between seizures. ? A seizure that makes it harder to breathe. ? A seizure and you can no longer speak or use part of your body.  You do not wake up right after a seizure.  You get hurt during a seizure.  You feel confused or have pain right after a seizure. These symptoms may be an emergency. Get help right away. Call your local emergency services (911 in the [place]  Do not wait to see if the symptoms will go away.  Do not drive yourself to the  hospital. Summary  A seizure is a sudden burst of abnormal electrical and chemical activity in the brain. Seizures normally last from 30 seconds to 2 minutes.  Causes of seizures include illness, injury to the head, low levels of blood sugar or salt, and certain conditions.  Most seizures will stop on their own in less than 5 minutes. Seizures that last longer than 5 minutes are a medical emergency and need treatment right away.  Many medicines are used to treat seizures. Take over-the-counter and prescription medicines only as told by your doctor. This information is not intended to replace advice given to you by your health care provider. Make sure you discuss any questions you have with your health care provider. Document Revised: 03/29/2020 Document Reviewed: 03/29/2020 Elsevier Patient Education  2021 Elsevier Inc.   Stroke Prevention Some medical conditions and lifestyle choices can lead to a higher risk for a stroke. You can help to prevent a stroke by eating healthy foods and exercising. It also helps to not smoke and to manage any health problems you may have. How can this condition affect me? A stroke is an emergency. It should be treated right away. A stroke can lead to brain damage or threaten your life. There is a better chance of surviving and getting better after a stroke if you get medical help right away. What can increase my risk? The following medical conditions may increase your risk of a stroke:  Diseases of the heart and blood vessels (cardiovascular disease).  High blood pressure (hypertension).  Diabetes.  High cholesterol.  Sickle cell disease.  Problems with blood clotting.  Being very overweight.  Sleeping problems (obstructivesleep apnea). Other risk factors include:  Being older than age 65.  A history of blood clots, stroke, or mini-stroke (TIA).  Race, ethnic background, or a family history of stroke.  Smoking or using tobacco  products.  Taking birth control pills, especially if you smoke.  Heavy alcohol and drug use.  Not being active. What actions can I take to prevent this? Manage your health conditions  High cholesterol. ? Eat a healthy diet. If this is not enough to manage your cholesterol, you  may need to take medicines. ? Take medicines as told by your doctor.  High blood pressure. ? Try to keep your blood pressure below 130/80. ? If your blood pressure cannot be managed through a healthy diet and regular exercise, you may need to take medicines. ? Take medicines as told by your doctor. ? Ask your doctor if you should check your blood pressure at home. ? Have your blood pressure checked every year.  Diabetes. ? Eat a healthy diet and get regular exercise. If your blood sugar (glucose) cannot be managed through diet and exercise, you may need to take medicines. ? Take medicines as told by your doctor.  Talk to your doctor about getting checked for sleeping problems. Signs of a problem can include: ? Snoring a lot. ? Feeling very tired.  Make sure that you manage any other conditions you have. Nutrition  Follow instructions from your doctor about what to eat or drink. You may be told to: ? Eat and drink fewer calories each day. ? Limit how much salt (sodium) you use to 1,500 milligrams (mg) each day. ? Use only healthy fats for cooking, such as olive oil, canola oil, and sunflower oil. ? Eat healthy foods. To do this:  Choose foods that are high in fiber. These include whole grains, and fresh fruits and vegetables.  Eat at least 5 servings of fruits and vegetables a day. Try to fill one-half of your plate with fruits and vegetables at each meal.  Choose low-fat (lean) proteins. These include low-fat cuts of meat, chicken without skin, fish, tofu, beans, and nuts.  Eat low-fat dairy products. ? Avoid foods that:  Are high in salt.  Have saturated fat.  Have trans fat.  Have  cholesterol.  Are processed or pre-made. ? Count how many carbohydrates you eat and drink each day.   Lifestyle  If you drink alcohol: ? Limit how much you have to:  0-1 drink a day for women who are not pregnant.  0-2 drinks a day for men. ? Know how much alcohol is in your drink. In the [place] one drink equals one 12 oz bottle of beer ( ), one 5 oz glass of wine ( ), or one 1 oz glass of hard liquor (90mL).  Do not smoke or use any products that have nicotine or tobacco. If you need help quitting, ask your doctor.  Avoid secondhand smoke.  Do not use drugs. Activity  Try to stay at a healthy weight.  Get at least 30 minutes of exercise on most days, such as: ? Fast walking. ? Biking. ? Swimming.   Medicines  Take over-the-counter and prescription medicines only as told by your doctor.  Avoid taking birth control pills. Talk to your doctor about the risks of taking birth control pills if: ? You are over 10 years old. ? You smoke. ? You get very bad headaches. ? You have had a blood clot. Where to find more information  American Stroke Association: www.strokeassociation.org Get help right away if:  You or a loved one has any signs of a stroke. "BE FAST" is an easy way to remember the warning signs: ? B - Balance. Dizziness, sudden trouble walking, or loss of balance. ? E - Eyes. Trouble seeing or a change in how you see. ? F - Face. Sudden weakness or loss of feeling of the face. The face or eyelid may droop on one side. ? A - Arms. Weakness or loss of feeling in an arm.  This happens all of a sudden and most often on one side of the body. ? S - Speech. Sudden trouble speaking, slurred speech, or trouble understanding what people say. ? T - Time. Time to call emergency services. Write down what time symptoms started.  You or a loved one has other signs of a stroke, such as: ? A sudden, very bad headache with no known cause. ? Feeling like you may vomit  (nausea). ? Vomiting. ? A seizure. These symptoms may be an emergency. Get help right away. Call your local emergency services (911 in the [place]  Do not wait to see if the symptoms will go away.  Do not drive yourself to the hospital. Summary  You can help to prevent a stroke by eating healthy, exercising, and not smoking. It also helps to manage any health problems you have.  Do not smoke or use any products that contain nicotine or tobacco.  Get help right away if you or a loved one has any signs of a stroke. This information is not intended to replace advice given to you by your health care provider. Make sure you discuss any questions you have with your health care provider. Document Revised: 04/22/2020 Document Reviewed: 04/22/2020 Elsevier Patient Education  2021 [employer]

## 2021-02-25 ENCOUNTER — Ambulatory Visit: Payer: HMO | Admitting: Family Medicine

## 2021-02-25 ENCOUNTER — Encounter: Payer: Self-pay | Admitting: Family Medicine

## 2021-02-25 VITALS — BP 149/67 | HR 64 | Ht 69.0 in | Wt 179.8 lb

## 2021-02-25 DIAGNOSIS — F03B0 Unspecified dementia, moderate, without behavioral disturbance, psychotic disturbance, mood disturbance, and anxiety: Secondary | ICD-10-CM

## 2021-02-25 DIAGNOSIS — F0390 Unspecified dementia without behavioral disturbance: Secondary | ICD-10-CM

## 2021-02-25 DIAGNOSIS — G40909 Epilepsy, unspecified, not intractable, without status epilepticus: Secondary | ICD-10-CM

## 2021-02-25 DIAGNOSIS — I6381 Other cerebral infarction due to occlusion or stenosis of small artery: Secondary | ICD-10-CM

## 2021-02-25 DIAGNOSIS — I639 Cerebral infarction, unspecified: Secondary | ICD-10-CM

## 2021-02-25 MED ORDER — LEVETIRACETAM ER 500 MG PO TB24: ORAL_TABLET | ORAL | 3 refills | Status: DC

## 2021-02-25 MED ORDER — MEMANTINE HCL 10 MG PO TABS: 10.0000 mg | ORAL_TABLET | Freq: Two times a day (BID) | ORAL | 3 refills | Status: DC

## 2021-02-27 NOTE — Progress Notes (Signed)
I reviewed note and agree with plan.   Chasta Deshpande R. Samel Bruna, MD 02/27/2021, 10:37 AM Certified in Neurology, Neurophysiology and Neuroimaging  [facility] [address], [address] [place], [place] [address] [phone]  

## 2021-02-28 DIAGNOSIS — N183 Chronic kidney disease, stage 3 unspecified: Secondary | ICD-10-CM | POA: Diagnosis not present

## 2021-02-28 DIAGNOSIS — E1169 Type 2 diabetes mellitus with other specified complication: Secondary | ICD-10-CM | POA: Diagnosis not present

## 2021-02-28 DIAGNOSIS — F329 Major depressive disorder, single episode, unspecified: Secondary | ICD-10-CM | POA: Diagnosis not present

## 2021-02-28 DIAGNOSIS — K219 Gastro-esophageal reflux disease without esophagitis: Secondary | ICD-10-CM | POA: Diagnosis not present

## 2021-02-28 DIAGNOSIS — F0390 Unspecified dementia without behavioral disturbance: Secondary | ICD-10-CM | POA: Diagnosis not present

## 2021-02-28 DIAGNOSIS — E7800 Pure hypercholesterolemia, unspecified: Secondary | ICD-10-CM | POA: Diagnosis not present

## 2021-02-28 DIAGNOSIS — I10 Essential (primary) hypertension: Secondary | ICD-10-CM | POA: Diagnosis not present

## 2021-02-28 DIAGNOSIS — D649 Anemia, unspecified: Secondary | ICD-10-CM | POA: Diagnosis not present

## 2021-03-04 DIAGNOSIS — E1169 Type 2 diabetes mellitus with other specified complication: Secondary | ICD-10-CM | POA: Diagnosis not present

## 2021-03-08 DIAGNOSIS — R0781 Pleurodynia: Secondary | ICD-10-CM | POA: Diagnosis not present

## 2021-03-31 DIAGNOSIS — K219 Gastro-esophageal reflux disease without esophagitis: Secondary | ICD-10-CM | POA: Diagnosis not present

## 2021-03-31 DIAGNOSIS — D649 Anemia, unspecified: Secondary | ICD-10-CM | POA: Diagnosis not present

## 2021-03-31 DIAGNOSIS — N183 Chronic kidney disease, stage 3 unspecified: Secondary | ICD-10-CM | POA: Diagnosis not present

## 2021-03-31 DIAGNOSIS — E1169 Type 2 diabetes mellitus with other specified complication: Secondary | ICD-10-CM | POA: Diagnosis not present

## 2021-03-31 DIAGNOSIS — I10 Essential (primary) hypertension: Secondary | ICD-10-CM | POA: Diagnosis not present

## 2021-03-31 DIAGNOSIS — F0390 Unspecified dementia without behavioral disturbance: Secondary | ICD-10-CM | POA: Diagnosis not present

## 2021-03-31 DIAGNOSIS — E7800 Pure hypercholesterolemia, unspecified: Secondary | ICD-10-CM | POA: Diagnosis not present

## 2021-03-31 DIAGNOSIS — F329 Major depressive disorder, single episode, unspecified: Secondary | ICD-10-CM | POA: Diagnosis not present

## 2021-04-02 DIAGNOSIS — E1169 Type 2 diabetes mellitus with other specified complication: Secondary | ICD-10-CM | POA: Diagnosis not present

## 2021-04-07 DIAGNOSIS — W19XXXA Unspecified fall, initial encounter: Secondary | ICD-10-CM | POA: Diagnosis not present

## 2021-04-07 DIAGNOSIS — R4020 Unspecified coma: Secondary | ICD-10-CM | POA: Diagnosis not present

## 2021-05-01 DIAGNOSIS — E1169 Type 2 diabetes mellitus with other specified complication: Secondary | ICD-10-CM | POA: Diagnosis not present

## 2021-05-02 DIAGNOSIS — D649 Anemia, unspecified: Secondary | ICD-10-CM | POA: Diagnosis not present

## 2021-05-02 DIAGNOSIS — F329 Major depressive disorder, single episode, unspecified: Secondary | ICD-10-CM | POA: Diagnosis not present

## 2021-05-02 DIAGNOSIS — N183 Chronic kidney disease, stage 3 unspecified: Secondary | ICD-10-CM | POA: Diagnosis not present

## 2021-05-02 DIAGNOSIS — E1169 Type 2 diabetes mellitus with other specified complication: Secondary | ICD-10-CM | POA: Diagnosis not present

## 2021-05-02 DIAGNOSIS — E7800 Pure hypercholesterolemia, unspecified: Secondary | ICD-10-CM | POA: Diagnosis not present

## 2021-05-02 DIAGNOSIS — K219 Gastro-esophageal reflux disease without esophagitis: Secondary | ICD-10-CM | POA: Diagnosis not present

## 2021-05-02 DIAGNOSIS — F0390 Unspecified dementia without behavioral disturbance: Secondary | ICD-10-CM | POA: Diagnosis not present

## 2021-05-02 DIAGNOSIS — I10 Essential (primary) hypertension: Secondary | ICD-10-CM | POA: Diagnosis not present

## 2021-05-06 DIAGNOSIS — M25511 Pain in right shoulder: Secondary | ICD-10-CM | POA: Diagnosis not present

## 2021-05-06 DIAGNOSIS — R609 Edema, unspecified: Secondary | ICD-10-CM | POA: Diagnosis not present

## 2021-05-06 DIAGNOSIS — I69354 Hemiplegia and hemiparesis following cerebral infarction affecting left non-dominant side: Secondary | ICD-10-CM | POA: Diagnosis not present

## 2021-05-06 DIAGNOSIS — I10 Essential (primary) hypertension: Secondary | ICD-10-CM | POA: Diagnosis not present

## 2021-05-12 ENCOUNTER — Other Ambulatory Visit: Payer: Self-pay

## 2021-05-12 ENCOUNTER — Encounter: Payer: Self-pay | Admitting: Podiatry

## 2021-05-12 ENCOUNTER — Ambulatory Visit: Payer: HMO | Admitting: Podiatry

## 2021-05-12 DIAGNOSIS — E1159 Type 2 diabetes mellitus with other circulatory complications: Secondary | ICD-10-CM | POA: Diagnosis not present

## 2021-05-12 DIAGNOSIS — M79674 Pain in right toe(s): Secondary | ICD-10-CM

## 2021-05-12 DIAGNOSIS — M79675 Pain in left toe(s): Secondary | ICD-10-CM | POA: Diagnosis not present

## 2021-05-12 DIAGNOSIS — B351 Tinea unguium: Secondary | ICD-10-CM | POA: Diagnosis not present

## 2021-05-12 NOTE — Progress Notes (Signed)
This patient presents to my office for at risk foot care.  This patient requires this care by a professional since this patient will be at risk due to having type 2 diabetes and coagulation defect.  Patient is taking plavix. This patient presents to the office with his wife and in a wheelchair.  This patient is unable to cut nails himself since the patient cannot reach his nails.These nails are painful  wearing shoes.  This patient presents for at risk foot care today.  General Appearance  Alert, conversant and in no acute stress.  Vascular  Dorsalis pedis and posterior tibial  pulses are  not palpable due to swelling  bilaterally.  Capillary return is within normal limits  bilaterally. Cold feet noted.  bilaterally.  Absent digital hair  B/L.  Neurologic  Senn-Weinstein monofilament wire test diminished  bilaterally. Muscle power within normal limits bilaterally.  Nails Thick disfigured discolored nails with subungual debris  hallux toenails  bilaterally. No evidence of bacterial infection or drainage bilaterally.  Orthopedic  No limitations of motion  feet .  No crepitus or effusions noted.  No bony pathology or digital deformities noted. HAV  B/L.  Skin  normotropic skin with no porokeratosis noted bilaterally.  No signs of infections or ulcers noted.     Onychomycosis  Pain in right toes  Pain in left toes  Consent was obtained for treatment procedures.   Mechanical debridement of nails 1-5  bilaterally performed with a nail nipper.  Filed with dremel without incident.  Diabetic foot exam was performed.  Patient has vascular pathology in that pulses are absent B/L and having cold feet.  Diminished LOPS noted..  Patient nails need to be treated every six months but patient was told to return for foot check every three months.   Return office visit  3   months                 Told patient to return for periodic foot care and evaluation due to potential at risk complications.   Helane Gunther  DPM

## 2021-05-29 DIAGNOSIS — I10 Essential (primary) hypertension: Secondary | ICD-10-CM | POA: Diagnosis not present

## 2021-05-29 DIAGNOSIS — E7800 Pure hypercholesterolemia, unspecified: Secondary | ICD-10-CM | POA: Diagnosis not present

## 2021-05-29 DIAGNOSIS — D649 Anemia, unspecified: Secondary | ICD-10-CM | POA: Diagnosis not present

## 2021-05-29 DIAGNOSIS — F329 Major depressive disorder, single episode, unspecified: Secondary | ICD-10-CM | POA: Diagnosis not present

## 2021-05-29 DIAGNOSIS — E1169 Type 2 diabetes mellitus with other specified complication: Secondary | ICD-10-CM | POA: Diagnosis not present

## 2021-05-29 DIAGNOSIS — K219 Gastro-esophageal reflux disease without esophagitis: Secondary | ICD-10-CM | POA: Diagnosis not present

## 2021-05-29 DIAGNOSIS — N183 Chronic kidney disease, stage 3 unspecified: Secondary | ICD-10-CM | POA: Diagnosis not present

## 2021-05-29 DIAGNOSIS — F0390 Unspecified dementia without behavioral disturbance: Secondary | ICD-10-CM | POA: Diagnosis not present

## 2021-06-03 DIAGNOSIS — E1169 Type 2 diabetes mellitus with other specified complication: Secondary | ICD-10-CM | POA: Diagnosis not present

## 2021-06-27 DIAGNOSIS — F0390 Unspecified dementia without behavioral disturbance: Secondary | ICD-10-CM | POA: Diagnosis not present

## 2021-06-27 DIAGNOSIS — F329 Major depressive disorder, single episode, unspecified: Secondary | ICD-10-CM | POA: Diagnosis not present

## 2021-06-27 DIAGNOSIS — K219 Gastro-esophageal reflux disease without esophagitis: Secondary | ICD-10-CM | POA: Diagnosis not present

## 2021-06-27 DIAGNOSIS — D649 Anemia, unspecified: Secondary | ICD-10-CM | POA: Diagnosis not present

## 2021-06-27 DIAGNOSIS — E7800 Pure hypercholesterolemia, unspecified: Secondary | ICD-10-CM | POA: Diagnosis not present

## 2021-06-27 DIAGNOSIS — N183 Chronic kidney disease, stage 3 unspecified: Secondary | ICD-10-CM | POA: Diagnosis not present

## 2021-06-27 DIAGNOSIS — E1169 Type 2 diabetes mellitus with other specified complication: Secondary | ICD-10-CM | POA: Diagnosis not present

## 2021-06-27 DIAGNOSIS — I10 Essential (primary) hypertension: Secondary | ICD-10-CM | POA: Diagnosis not present

## 2021-07-03 DIAGNOSIS — E1169 Type 2 diabetes mellitus with other specified complication: Secondary | ICD-10-CM | POA: Diagnosis not present

## 2021-07-28 DIAGNOSIS — I10 Essential (primary) hypertension: Secondary | ICD-10-CM | POA: Diagnosis not present

## 2021-07-28 DIAGNOSIS — F329 Major depressive disorder, single episode, unspecified: Secondary | ICD-10-CM | POA: Diagnosis not present

## 2021-07-28 DIAGNOSIS — N183 Chronic kidney disease, stage 3 unspecified: Secondary | ICD-10-CM | POA: Diagnosis not present

## 2021-07-28 DIAGNOSIS — E1169 Type 2 diabetes mellitus with other specified complication: Secondary | ICD-10-CM | POA: Diagnosis not present

## 2021-07-28 DIAGNOSIS — F0390 Unspecified dementia without behavioral disturbance: Secondary | ICD-10-CM | POA: Diagnosis not present

## 2021-07-28 DIAGNOSIS — K219 Gastro-esophageal reflux disease without esophagitis: Secondary | ICD-10-CM | POA: Diagnosis not present

## 2021-07-28 DIAGNOSIS — D649 Anemia, unspecified: Secondary | ICD-10-CM | POA: Diagnosis not present

## 2021-07-28 DIAGNOSIS — E7800 Pure hypercholesterolemia, unspecified: Secondary | ICD-10-CM | POA: Diagnosis not present

## 2021-08-04 DIAGNOSIS — Z23 Encounter for immunization: Secondary | ICD-10-CM | POA: Diagnosis not present

## 2021-08-04 DIAGNOSIS — Z0000 Encounter for general adult medical examination without abnormal findings: Secondary | ICD-10-CM | POA: Diagnosis not present

## 2021-08-04 DIAGNOSIS — I69354 Hemiplegia and hemiparesis following cerebral infarction affecting left non-dominant side: Secondary | ICD-10-CM | POA: Diagnosis not present

## 2021-08-04 DIAGNOSIS — E7800 Pure hypercholesterolemia, unspecified: Secondary | ICD-10-CM | POA: Diagnosis not present

## 2021-08-04 DIAGNOSIS — F329 Major depressive disorder, single episode, unspecified: Secondary | ICD-10-CM | POA: Diagnosis not present

## 2021-08-04 DIAGNOSIS — E1169 Type 2 diabetes mellitus with other specified complication: Secondary | ICD-10-CM | POA: Diagnosis not present

## 2021-08-04 DIAGNOSIS — L89302 Pressure ulcer of unspecified buttock, stage 2: Secondary | ICD-10-CM | POA: Diagnosis not present

## 2021-08-04 DIAGNOSIS — I10 Essential (primary) hypertension: Secondary | ICD-10-CM | POA: Diagnosis not present

## 2021-08-06 ENCOUNTER — Telehealth: Payer: Self-pay | Admitting: Family Medicine
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
## 2021-08-06 NOTE — Telephone Encounter (Signed)
Pt's wife called wants to discuss some she has noticed with her husband. Pt's wife requesting a call back.

## 2021-08-07 NOTE — Telephone Encounter (Signed)
Called the patient's wife back to advise that we did bring to Amy's attention the concerns in regards to the pt. Pt's wife verbalized understanding and at this time she doesn't want to change anything at this time. She will call back if things worsen and feels like he needs to be seen sooner. She was appreciative for the call back.  Per Amy: Please reassure her that this can happen with dementia. I could bring them in to discuss adding some medicaitons that could help but all have potential adverse effects, most commonly dizziness, sedation, sleepiness like side effects. Please see if we can get him worked in with me or primary neurologist.

## 2021-08-18 ENCOUNTER — Ambulatory Visit: Payer: HMO | Admitting: Podiatry

## 2021-08-26 DIAGNOSIS — F0390 Unspecified dementia without behavioral disturbance: Secondary | ICD-10-CM | POA: Diagnosis not present

## 2021-08-26 DIAGNOSIS — E1169 Type 2 diabetes mellitus with other specified complication: Secondary | ICD-10-CM | POA: Diagnosis not present

## 2021-08-26 DIAGNOSIS — N183 Chronic kidney disease, stage 3 unspecified: Secondary | ICD-10-CM | POA: Diagnosis not present

## 2021-08-26 DIAGNOSIS — E7800 Pure hypercholesterolemia, unspecified: Secondary | ICD-10-CM | POA: Diagnosis not present

## 2021-08-26 DIAGNOSIS — D649 Anemia, unspecified: Secondary | ICD-10-CM | POA: Diagnosis not present

## 2021-08-26 DIAGNOSIS — F329 Major depressive disorder, single episode, unspecified: Secondary | ICD-10-CM | POA: Diagnosis not present

## 2021-08-26 DIAGNOSIS — K219 Gastro-esophageal reflux disease without esophagitis: Secondary | ICD-10-CM | POA: Diagnosis not present

## 2021-08-26 DIAGNOSIS — I10 Essential (primary) hypertension: Secondary | ICD-10-CM | POA: Diagnosis not present

## 2021-09-02 DIAGNOSIS — H6123 Impacted cerumen, bilateral: Secondary | ICD-10-CM | POA: Diagnosis not present

## 2021-09-03 DIAGNOSIS — E1169 Type 2 diabetes mellitus with other specified complication: Secondary | ICD-10-CM | POA: Diagnosis not present

## 2021-09-10 ENCOUNTER — Telehealth: Payer: Self-pay | Admitting: Family Medicine

## 2021-09-10 DIAGNOSIS — R269 Unspecified abnormalities of gait and mobility: Secondary | ICD-10-CM

## 2021-09-10 DIAGNOSIS — R2681 Unsteadiness on feet: Secondary | ICD-10-CM

## 2021-09-10 DIAGNOSIS — R531 Weakness: Secondary | ICD-10-CM

## 2021-09-10 DIAGNOSIS — F03B0 Unspecified dementia, moderate, without behavioral disturbance, psychotic disturbance, mood disturbance, and anxiety: Secondary | ICD-10-CM

## 2021-09-10 DIAGNOSIS — I6381 Other cerebral infarction due to occlusion or stenosis of small artery: Secondary | ICD-10-CM

## 2021-09-10 DIAGNOSIS — I639 Cerebral infarction, unspecified: Secondary | ICD-10-CM

## 2021-09-10 DIAGNOSIS — G40909 Epilepsy, unspecified, not intractable, without status epilepticus: Secondary | ICD-10-CM

## 2021-09-10 DIAGNOSIS — R258 Other abnormal involuntary movements: Secondary | ICD-10-CM

## 2021-09-10 NOTE — Telephone Encounter (Signed)
Rx printed for manual WC, waiting on NP signature then will fax.

## 2021-09-10 NOTE — Telephone Encounter (Signed)
Called wife back. Relayed AL,NP note. Scheduled appt w/ Dr. Marjory Lies on 09/16/21 at 330p. AL,NP did not have anything available. Aware we will still fax order for [facility] once NP signs. She will reach out to insurance to see which home health agencies are in network to prepare for visit next week.
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
## 2021-09-12 ENCOUNTER — Telehealth: Payer: Self-pay | Admitting: Family Medicine

## 2021-09-12 NOTE — Telephone Encounter (Signed)
Pt's wife called wanting to know the update on the wheelchair order. Please advise.

## 2021-09-15 ENCOUNTER — Encounter: Payer: Self-pay | Admitting: Podiatry

## 2021-09-15 ENCOUNTER — Ambulatory Visit (INDEPENDENT_AMBULATORY_CARE_PROVIDER_SITE_OTHER): Payer: HMO | Admitting: Podiatry

## 2021-09-15 ENCOUNTER — Other Ambulatory Visit: Payer: Self-pay

## 2021-09-15 DIAGNOSIS — E1159 Type 2 diabetes mellitus with other circulatory complications: Secondary | ICD-10-CM | POA: Diagnosis not present

## 2021-09-15 DIAGNOSIS — B351 Tinea unguium: Secondary | ICD-10-CM | POA: Diagnosis not present

## 2021-09-15 DIAGNOSIS — M79674 Pain in right toe(s): Secondary | ICD-10-CM

## 2021-09-15 DIAGNOSIS — M79675 Pain in left toe(s): Secondary | ICD-10-CM

## 2021-09-15 NOTE — Telephone Encounter (Signed)
Called the pt's wife back. There was no answer. Lvm advising that the patient's order was sent to [facility] supply 09/11/21 and we received confirmation. Advised they should follow up with that location

## 2021-09-15 NOTE — Telephone Encounter (Signed)
Rx was faxed to [facility] 09/11/21, received fax confirmation.

## 2021-09-15 NOTE — Progress Notes (Signed)
This patient presents to my office for at risk foot care.  This patient requires this care by a professional since this patient will be at risk due to having type 2 diabetes and coagulation defect.  Patient is taking plavix. This patient presents to the office with his wife and in a wheelchair.  This patient is unable to cut nails himself since the patient cannot reach his nails.These nails are painful  wearing shoes.  This patient presents for at risk foot care today.  General Appearance  Alert, conversant and in no acute stress.  Vascular  Dorsalis pedis and posterior tibial  pulses are  not palpable due to swelling  bilaterally.  Capillary return is within normal limits  bilaterally. Cold feet noted.  bilaterally.  Absent digital hair  B/L.  Neurologic  Senn-Weinstein monofilament wire test diminished  bilaterally. Muscle power within normal limits bilaterally.  Nails Thick disfigured discolored nails with subungual debris  hallux toenails  bilaterally. No evidence of bacterial infection or drainage bilaterally.  Orthopedic  No limitations of motion  feet .  No crepitus or effusions noted.  No bony pathology or digital deformities noted. HAV  B/L.  Skin  normotropic skin with no porokeratosis noted bilaterally.  No signs of infections or ulcers noted.     Onychomycosis  Pain in right toes  Pain in left toes  Consent was obtained for treatment procedures.   Mechanical debridement of nails 1-5  bilaterally performed with a nail nipper.  Filed with dremel without incident.  Diabetic foot exam was performed.  Patient has vascular pathology in that pulses are absent B/L and having cold feet.  Diminished LOPS noted..  Discussed footcare with his wife.  To reschedule in 4 months for both nail care and foot evaluation.   Return office visit  3   months                 Told patient to return for periodic foot care and evaluation due to potential at risk complications.   Helane Gunther DPM

## 2021-09-16 ENCOUNTER — Ambulatory Visit: Payer: HMO | Admitting: Diagnostic Neuroimaging

## 2021-09-22 ENCOUNTER — Telehealth: Payer: Self-pay | Admitting: Family Medicine

## 2021-09-22 NOTE — Telephone Encounter (Signed)
Called and spoke with wife. Advised I pulled previous order we faxed and included ICD 10 codes. I re-faxed to [facility] at [phone]. Received fax confirmation. She verbalized understanding and appreciation.

## 2021-09-22 NOTE — Telephone Encounter (Signed)
Pt's wife is asking for a call from RN to discuss pt's wheelchair.  She is being told the company needs the DX code and the ICD10 code.  Wife asked it be noted she is open to another company being used due to the run around she feels she is getting from current company being used.

## 2021-09-25 DIAGNOSIS — E1169 Type 2 diabetes mellitus with other specified complication: Secondary | ICD-10-CM | POA: Diagnosis not present

## 2021-09-25 DIAGNOSIS — N183 Chronic kidney disease, stage 3 unspecified: Secondary | ICD-10-CM | POA: Diagnosis not present

## 2021-09-25 DIAGNOSIS — D649 Anemia, unspecified: Secondary | ICD-10-CM | POA: Diagnosis not present

## 2021-09-25 DIAGNOSIS — F329 Major depressive disorder, single episode, unspecified: Secondary | ICD-10-CM | POA: Diagnosis not present

## 2021-09-25 DIAGNOSIS — I10 Essential (primary) hypertension: Secondary | ICD-10-CM | POA: Diagnosis not present

## 2021-09-25 DIAGNOSIS — F0390 Unspecified dementia without behavioral disturbance: Secondary | ICD-10-CM | POA: Diagnosis not present

## 2021-09-25 DIAGNOSIS — K219 Gastro-esophageal reflux disease without esophagitis: Secondary | ICD-10-CM | POA: Diagnosis not present

## 2021-09-25 DIAGNOSIS — E7800 Pure hypercholesterolemia, unspecified: Secondary | ICD-10-CM | POA: Diagnosis not present

## 2021-10-02 NOTE — Telephone Encounter (Signed)
Montel Clock @ [employer] ph [phone] fax#[phone] is asking for progress notes re: the order needed for pt's wheel chair, please call. Or just fax over

## 2021-10-02 NOTE — Telephone Encounter (Signed)
Noted will monitor

## 2021-10-02 NOTE — Telephone Encounter (Signed)
Pod 3- If Dr. Marjory Lies as any sooner openings become available than 11/04/21, pt wife would like to be called. Thank you! I added him to the wait list.  Called wife back. Confirmed he would need in person visit in order to get wheelchair d/t insurance. Can address this and referral to home health at appt. She states they missed most recent appt because when she went to get him from facility, it took them 30 min to bring him out and caused them to be late. Advised we can call if any sooner appt become available for Dr. Marjory Lies but as of right now, there are no sooner appt available. She verbalized understanding and appreciation.

## 2021-10-02 NOTE — Telephone Encounter (Signed)
I called Montel Clock back to let her know pt was supposed to have f/u w. Dr. Marjory Lies on 09/16/21 but appears this was cx. Next appt on 11/04/21. Once pt seen, notes can be forwarded. They need F2F for WC order (required by insurance). She will update wife.

## 2021-10-02 NOTE — Telephone Encounter (Signed)
Pt's wife, Brayam Boeke would like a call from the nurse to discuss wheelchair procedure. Was told by Montel Clock we are suppose to do a visual.

## 2021-10-03 DIAGNOSIS — E1169 Type 2 diabetes mellitus with other specified complication: Secondary | ICD-10-CM | POA: Diagnosis not present

## 2021-10-15 DIAGNOSIS — R413 Other amnesia: Secondary | ICD-10-CM | POA: Diagnosis not present

## 2021-10-15 DIAGNOSIS — R430 Anosmia: Secondary | ICD-10-CM | POA: Diagnosis not present

## 2021-10-15 DIAGNOSIS — N1832 Chronic kidney disease, stage 3b: Secondary | ICD-10-CM | POA: Diagnosis not present

## 2021-10-15 DIAGNOSIS — M329 Systemic lupus erythematosus, unspecified: Secondary | ICD-10-CM | POA: Diagnosis not present

## 2021-10-15 DIAGNOSIS — M150 Primary generalized (osteo)arthritis: Secondary | ICD-10-CM | POA: Diagnosis not present

## 2021-10-15 DIAGNOSIS — M25511 Pain in right shoulder: Secondary | ICD-10-CM | POA: Diagnosis not present

## 2021-10-15 DIAGNOSIS — M5136 Other intervertebral disc degeneration, lumbar region: Secondary | ICD-10-CM | POA: Diagnosis not present

## 2021-10-21 DIAGNOSIS — E7800 Pure hypercholesterolemia, unspecified: Secondary | ICD-10-CM | POA: Diagnosis not present

## 2021-10-21 DIAGNOSIS — F329 Major depressive disorder, single episode, unspecified: Secondary | ICD-10-CM | POA: Diagnosis not present

## 2021-10-21 DIAGNOSIS — E1169 Type 2 diabetes mellitus with other specified complication: Secondary | ICD-10-CM | POA: Diagnosis not present

## 2021-10-21 DIAGNOSIS — F0390 Unspecified dementia without behavioral disturbance: Secondary | ICD-10-CM | POA: Diagnosis not present

## 2021-10-21 DIAGNOSIS — D649 Anemia, unspecified: Secondary | ICD-10-CM | POA: Diagnosis not present

## 2021-10-21 DIAGNOSIS — I10 Essential (primary) hypertension: Secondary | ICD-10-CM | POA: Diagnosis not present

## 2021-10-21 DIAGNOSIS — N183 Chronic kidney disease, stage 3 unspecified: Secondary | ICD-10-CM | POA: Diagnosis not present

## 2021-10-28 ENCOUNTER — Ambulatory Visit: Payer: HMO | Admitting: Diagnostic Neuroimaging

## 2021-11-04 ENCOUNTER — Encounter: Payer: Self-pay | Admitting: Diagnostic Neuroimaging

## 2021-11-04 ENCOUNTER — Ambulatory Visit: Payer: HMO | Admitting: Diagnostic Neuroimaging

## 2021-11-04 VITALS — BP 142/88 | Wt 188.8 lb

## 2021-11-04 DIAGNOSIS — E1169 Type 2 diabetes mellitus with other specified complication: Secondary | ICD-10-CM | POA: Diagnosis not present

## 2021-11-04 DIAGNOSIS — F03C11 Unspecified dementia, severe, with agitation: Secondary | ICD-10-CM

## 2021-11-04 MED ORDER — LEVETIRACETAM ER 500 MG PO TB24: 500.0000 mg | ORAL_TABLET | Freq: Two times a day (BID) | ORAL | 4 refills | Status: DC

## 2021-11-04 MED ORDER — MEMANTINE HCL 10 MG PO TABS: 10.0000 mg | ORAL_TABLET | Freq: Two times a day (BID) | ORAL | 4 refills | Status: AC

## 2021-11-04 NOTE — Progress Notes (Signed)
PATIENT: David Irwin DOB: 1945/12/21  REASON FOR VISIT: follow up for memory HISTORY FROM: patient, wife  Chief Complaint  Patient presents with   Follow-up    Room 7. Pt here with wife. Insurance needing a face to face for [facility].      HISTORY OF PRESENT ILLNESS:  UPDATE (11/04/21, VRP): Here for follow up. Dementia progressing. No seizures.  Still at home with help from wife. More gait and balance issues. Some aggression issues, but manageable. [facility].   UPDATE (07/27/18, VRP): Since last visit, doing poorly. Symptoms are progressive. More memory loss and gait diff. More decline in ADLs. Not driving. Also had a seizure like event on Monday (07/25/18) --> staring and shaking spell x 1 minute. Post-ictal confusion. Went to ER.   UPDATE 02/05/17: Since last visit, memory loss issues continue. Depression is mild and continues. Having some low back pain issues. Still trying to be active in the yard.   UPDATE 01/29/16: Since last visit, doing well, memory loss, but still with abnl spells (usually from waking up state) --> 3 episodes since last visit. Now improved with tinkering IT sales professional, outdoor activities).   UPDATE 08/16/15: Since last visit, memory loss is stable / slightly improved especially since restarting CPAP.  Wife also noted deep depression.  UPDATE 05/17/15: Since last visit, memory loss continues. Daydreaming staring spells are less often. Short term memory is poor. Long term memory stable. Gait stable.   UPDATE 12/10/14: Since last visit, doing about the same. 2 weeks ago patient had episode of staring spell 2, lasting less than a minute, when patient's wife was talking to him. Wife noted that he seemed to be acting differently. He was closing his eyes and shaking his head. She asked him what was wrong and he did not respond. She insisted on him trying to explain what was going on. He moved to stretch his muscles, rolled his neck, and finally was  able to say some words. However his verbal communication was not related to what his wife was asking about. He seemed to be talking about tangential topics. This lasted just for less than a minute or so. The day before patient had been trying a new pain medication, but he does not recall the name of it. Overall general memory loss has slightly progressed.  UPDATE 09/10/14: Since last visit, was doing well, except 1 more brief staring spell event few nights ago. Patient was able to respond verbally when wife called to him. Spell lasted < . No shaking.   UPDATE 08/01/14 (VRP): Since last visit, was stable, except has had intermittent spells according to wife: staring, inability to speak, jittery movements of head and hands, lasting 1 minute; no tongue biting or incontinence. No post-ictal confusion. No warning. Has had spells 1 every 3-4 months x 1 year, and then 2 spells in last 2 weeks. No triggers  UPDATE 05/04/14 (LL): Since last visit, patient has been well, but recognizes he has more difficulty remembering places he had been in the last year or two. He does not remember coming to this office one year ago. He has had no decline in activities of daily living. His blood sugars are well controlled, hemoglobin A1c is 6.0. He does not get regular exercise.  He has sleep apnea and has a CPAP machine and tries to use it but states that the full mask is uncomfortable. MMSE 24/30, AFT 12 and CDT 4/4.  UPDATE 05/02/13 (LL): Patient and  wife come to office for 6 month follow up for memory testing. Both feel that memory has been stable, no more instances of getting lost while driving. Patient feels like he has not gotten any worse, he has trouble remembering things that he knows sometimes, but he states it usually comes to him with time. Just not on "his" time.   PRIOR HPI (10/28/12, VRP): 76 year old right-handed male with history of lupus, diabetes, hypercholesterolemia, and hypertension, here for evaluation of  memory loss. Summary 2013, patient got lost while driving twice on [address]. He was briefly turned around but eventually was able to make his way to his destination. He had a third episode in December 2013. Since that time he has had no further events. His wife has noticed some short-term memory problems especially with verbal and auditory memory. One time he forgot his granddaughter's name (she is 40 years old and lives [place]). He has 3 grandchildren total. Patient denies any significant sleep or mood problems. Patient's wife reports some mild mood swings.    REVIEW OF SYSTEMS: Full 14 system review of systems performed and negative except: as per HPI.    ALLERGIES: Allergies  Allergen Reactions   Liraglutide Other (See Comments)    hallucinations    Shellfish Allergy Hives    HOME MEDICATIONS: Outpatient Medications Prior to Visit  Medication Sig Dispense Refill   acetaminophen (TYLENOL) 500 MG tablet Take 500 mg by mouth every 6 (six) hours as needed for headache (pain).     clobetasol cream (TEMOVATE) [id number] % Apply 1 application topically as needed (break out lupus reaction on scalp).     clopidogrel (PLAVIX) 75 MG tablet Take 1 tablet (75 mg total) by mouth daily. 30 tablet 11   fluticasone (FLONASE) 50 MCG/ACT nasal spray Place 2 sprays into both nostrils at bedtime as needed for allergies or rhinitis.   5   hydroxychloroquine (PLAQUENIL) 200 MG tablet Take 200 mg by mouth 2 (two) times daily.      Multiple Vitamins-Minerals (ALIVE MENS ENERGY PO) Take 1 tablet by mouth daily. Gummie     polyvinyl alcohol (LIQUIFILM TEARS) 1.4 % ophthalmic solution Place 1 drop into both eyes daily as needed for dry eyes.     potassium chloride SA (K-DUR,KLOR-CON) 20 MEQ tablet Take 20 mEq by mouth daily.      PRESCRIPTION MEDICATION Inhale into the lungs at bedtime. CPAP     simvastatin (ZOCOR) 20 MG tablet Take 20 mg by mouth at bedtime.      tamsulosin (FLOMAX) 0.4 MG CAPS Take 0.4 mg  by mouth daily.      telmisartan-hydrochlorothiazide (MICARDIS HCT) 40-12.5 MG tablet Take 1 tablet by mouth daily at 12 noon.     vitamin B-12 (CYANOCOBALAMIN) 1000 MCG tablet Take 1,000 mcg by mouth daily.     ciprofloxacin (CIPRO) 500 MG tablet Take 500 mg by mouth every 12 (twelve) hours. 15 day supply     levETIRAcetam (KEPPRA XR) 500 MG 24 hr tablet Take one tablet by mouth twice daily. 180 tablet 3   memantine (NAMENDA) 10 MG tablet Take 1 tablet (10 mg total) by mouth 2 (two) times daily. 180 tablet 3   QUEtiapine (SEROQUEL) 25 MG tablet Take 0.5 tablets (12.5 mg total) by mouth at bedtime. 45 tablet 3   omeprazole (PRILOSEC) 20 MG capsule Take 1 capsule (20 mg total) by mouth daily. 30 capsule 1   No facility-administered medications prior to visit.    PHYSICAL EXAM  GENERAL  EXAM/CONSTITUTIONAL: Vitals:  Vitals:   11/04/21 0934  BP: (!) 142/88  Weight: 188 lb 12.8 oz (85.6 kg)   Body mass index is 27.88 kg/m. Wt Readings from Last 3 Encounters:  11/04/21 188 lb 12.8 oz (85.6 kg)  02/25/21 179 lb 12.8 oz (81.6 kg)  08/27/20 196 lb (88.9 kg)   Patient is in no distress; well developed, nourished and groomed; neck is supple  CARDIOVASCULAR: Examination of carotid arteries is normal; no carotid bruits Regular rate and rhythm, no murmurs Examination of peripheral vascular system by observation and palpation is normal  EYES: Ophthalmoscopic exam of optic discs and posterior segments is normal; no papilledema or hemorrhages No results found.  MUSCULOSKELETAL: Gait, strength, tone, movements noted in Neurologic exam below  NEUROLOGIC: MENTAL STATUS:  MMSE - Mini Mental State Exam 05/23/2020 07/03/2019 07/27/2018  Not completed: - (No Data) -  Orientation to time 1 0 2  Orientation to Place 3 2 4   Registration 3 3 3   Attention/ Calculation 1 1 1   Recall 0 1 0  Language- name 2 objects 2 2 2   Language- repeat 1 1 1   Language- follow 3 step command 3 2 3   Language-  follow 3 step command-comments - he grabbed the paper with his left hand. -  Language- read & follow direction 1 1 1   Write a sentence 0 1 0  Write a sentence-comments - - no subject  Copy design 0 0 0  Total score 15 14 17    awake, alert, oriented to person DECR FLUENCY, COMPREHENSION, MEMORY  CRANIAL NERVE:  2nd, 3rd, 4th, 6th - pupils PINPOINT, NO REACTION; extraocular muscles intact, no nystagmus 5th - facial sensation symmetric 7th - facial strength symmetric 8th - hearing intact 9th - palate elevates symmetrically, uvula midline 11th - shoulder shrug symmetric 12th - tongue protrusion midline  MOTOR:  normal bulk and tone; DIFFUSE 3/5 IN BUE; 2/5 IN BLE  SENSORY:  normal and symmetric to light touch  COORDINATION:  finger-nose-finger, fine finger movements SLOW  REFLEXES:  deep tendon reflexes TRACE and symmetric  GAIT/STATION:  IN WHEELCHAIR    DIAGNOSTIC DATA (LABS, IMAGING, TESTING)  CBC    Component Value Date/Time   WBC 5.0 09/15/2020 1724   RBC 3.47 (L) 09/15/2020 1724   HGB 11.6 (L) 09/15/2020 1816   HCT 34.0 (L) 09/15/2020 1816   PLT 190 09/15/2020 1724   MCV 100.9 (H) 09/15/2020 1724   MCH 32.9 09/15/2020 1724   MCHC 32.6 09/15/2020 1724   RDW 11.8 09/15/2020 1724   LYMPHSABS 1.4 09/15/2020 1724   MONOABS 0.4 09/15/2020 1724   EOSABS 0.0 09/15/2020 1724   BASOSABS 0.0 09/15/2020 1724    CMP Latest Ref Rng & Units 09/15/2020 09/15/2020 12/07/2018  Glucose 70 - 99 mg/dL 108(H) 142(H) 190(H)  BUN 8 - 23 mg/dL 35(H) 32(H) 20  Creatinine 0.61 - 1.24 mg/dL 2.20(H) 2.18(H) 1.51(H)  Sodium 135 - 145 mmol/L 142 140 138  Potassium 3.5 - 5.1 mmol/L 4.2 4.0 4.1  Chloride 98 - 111 mmol/L 108 106 106  CO2 22 - 32 mmol/L - 24 25  Calcium 8.9 - 10.3 mg/dL - 9.1 9.0  Total Protein 6.5 - 8.1 g/dL - 7.1 -  Total Bilirubin 0.3 - 1.2 mg/dL - 0.9 -  Alkaline Phos 38 - 126 U/L - 48 -  AST 15 - 41 U/L - 22 -  ALT 0 - 44 U/L - 18 -     LABS: B12 791, TSH  2.14  11/08/12 MRI BRAIN - Mild periventricular and subcortical foci of T2 hyperintensities. These findings are non-specific and considerations include autoimmune, inflammatory, post-infectious, microvascular ischemia or migraine associated etiologies. No abnormal enhancing lesions.   08/18/14 MRI brain 1. Small acute-subacute left cerebellar ischemic infarction (45mm) within the postero-lateral left cerebellar hemisphere. 2. Several bilateral cerebellar chronic lacunar infarctions.   3. Mild periventricular, subcortical and juxtacortical chronic small vessel ischemic disease.   08/18/14 MRA head (without) demonstrating: 1. The right vertebral artery is hypoplastic near the skull base and then appears to be occluded for a short segment, and then has apparent reconstitution 1.7cm proximal to the vertebrobasilar junction. 2. Left vertebral artery is dominant, with normal flow throughout the basilar artery. 3. The anterior circulation is unremarkable.  08/15/14 carotid u/s - normal  08/06/14 TTE  - Left ventricle: The cavity size was normal. Wall thickness was normal. Systolic function was normal. The estimated ejection fraction was in the range of 60% to 65%. Wall motion was normal; there were no regional wall motion abnormalities. Doppler parameters are consistent with abnormal left ventricular relaxation (grade 1 diastolic dysfunction). - Aortic valve: There was mild regurgitation. - Pulmonary arteries: Systolic pressure was mildly increased. PA peak pressure: 40 mm Hg (S).  07/25/18 CT head [I reviewed images myself and agree with interpretation. -VRP]  - Atrophy.  Small vessel disease. - No acute intracranial findings. - Cerebrovascular atherosclerosis.   ASSESSMENT/PLAN:  76 y.o. male with:   Dx:  Severe dementia with agitation, unspecified dementia type    PLAN:  MODERATE-SEVERE DEMENTIA - continue memantine 10mg  twice a day - safety / supervision issues reviewed - caregiver  resources reviewed ([facility], [facility], home care agencies) - continue aspirin, statin, diabetes control  SEIZURE (due to dementia) - continue levetiracetam 500mg  twice a day   GAIT / MOBILITY - has wheelchair (donated via church)  DEPRESSION - follow up with PCP / psychiatry  Meds ordered this encounter  Medications   levETIRAcetam (KEPPRA XR) 500 MG 24 hr tablet    Sig: Take 1 tablet (500 mg total) by mouth in the morning and at bedtime. Take one tablet by mouth twice daily.    Dispense:  180 tablet    Refill:  4   memantine (NAMENDA) 10 MG tablet    Sig: Take 1 tablet (10 mg total) by mouth 2 (two) times daily.    Dispense:  180 tablet    Refill:  4   Return in about 1 year (around 11/04/2022) for with NP (Amy Lomax), MyChart visit (15 min).  I spent 46 minutes of face-to-face and non-face-to-face time with patient.  This included previsit chart review, lab review, study review, order entry, electronic health record documentation, patient education.      Penni Bombard, MD [id number], [id number] AM Certified in Neurology, Neurophysiology and Neuroimaging  [facility] [address], [place], [place] [address] [phone]

## 2021-11-18 DIAGNOSIS — H04123 Dry eye syndrome of bilateral lacrimal glands: Secondary | ICD-10-CM | POA: Diagnosis not present

## 2021-11-18 DIAGNOSIS — M3210 Systemic lupus erythematosus, organ or system involvement unspecified: Secondary | ICD-10-CM | POA: Diagnosis not present

## 2021-11-18 DIAGNOSIS — Z79899 Other long term (current) drug therapy: Secondary | ICD-10-CM | POA: Diagnosis not present

## 2021-11-18 DIAGNOSIS — H40013 Open angle with borderline findings, low risk, bilateral: Secondary | ICD-10-CM | POA: Diagnosis not present

## 2021-12-02 DIAGNOSIS — E1169 Type 2 diabetes mellitus with other specified complication: Secondary | ICD-10-CM | POA: Diagnosis not present

## 2021-12-30 IMAGING — CR DG CHEST 2V
2 series · 2 of 2 positions shown · non-contrast
Comparison: Chest radiograph 09/15/2020

CLINICAL DATA: Weight loss.  Shortness of breath.

EXAM:
CHEST - 2 VIEW

[w chest pa]
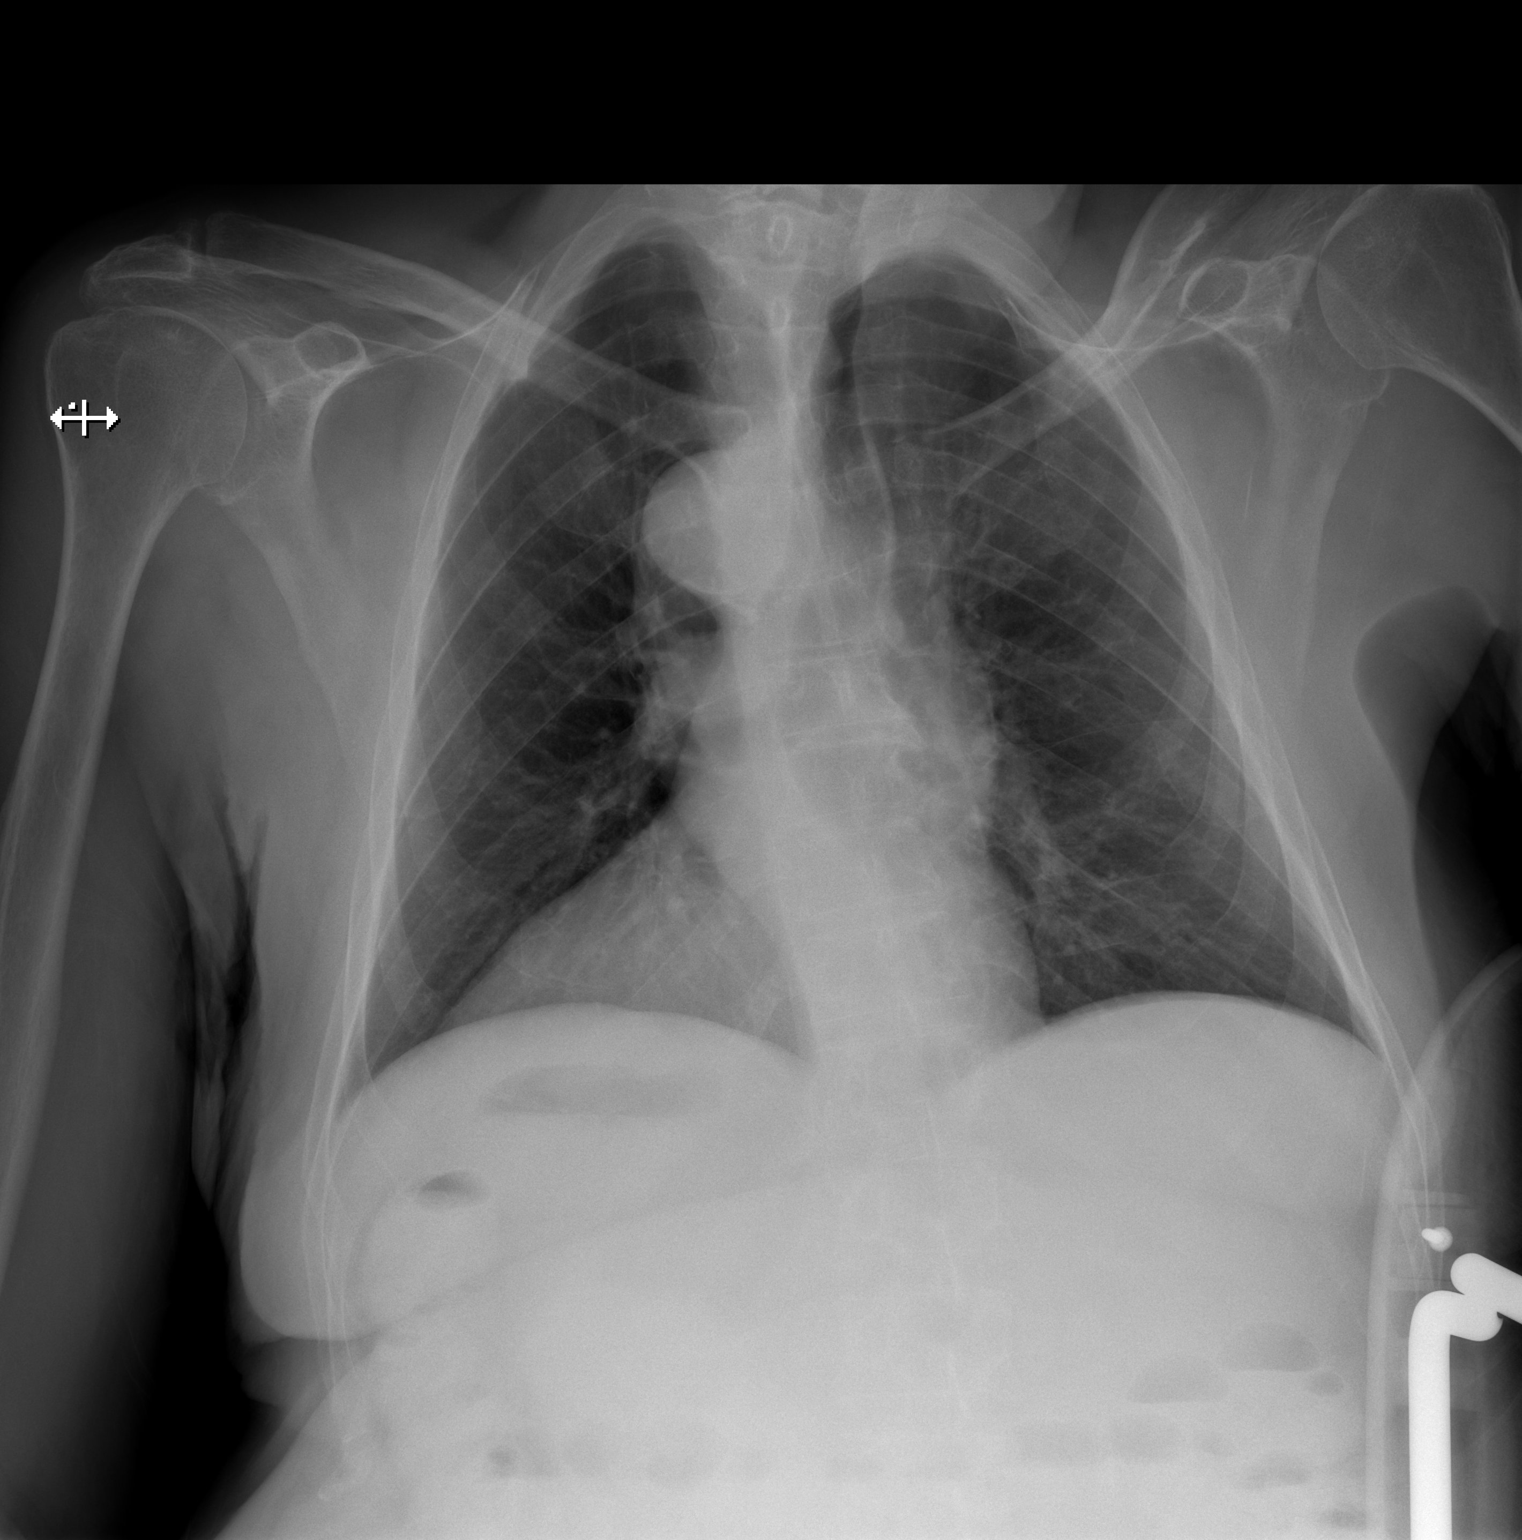

[w chest lat]
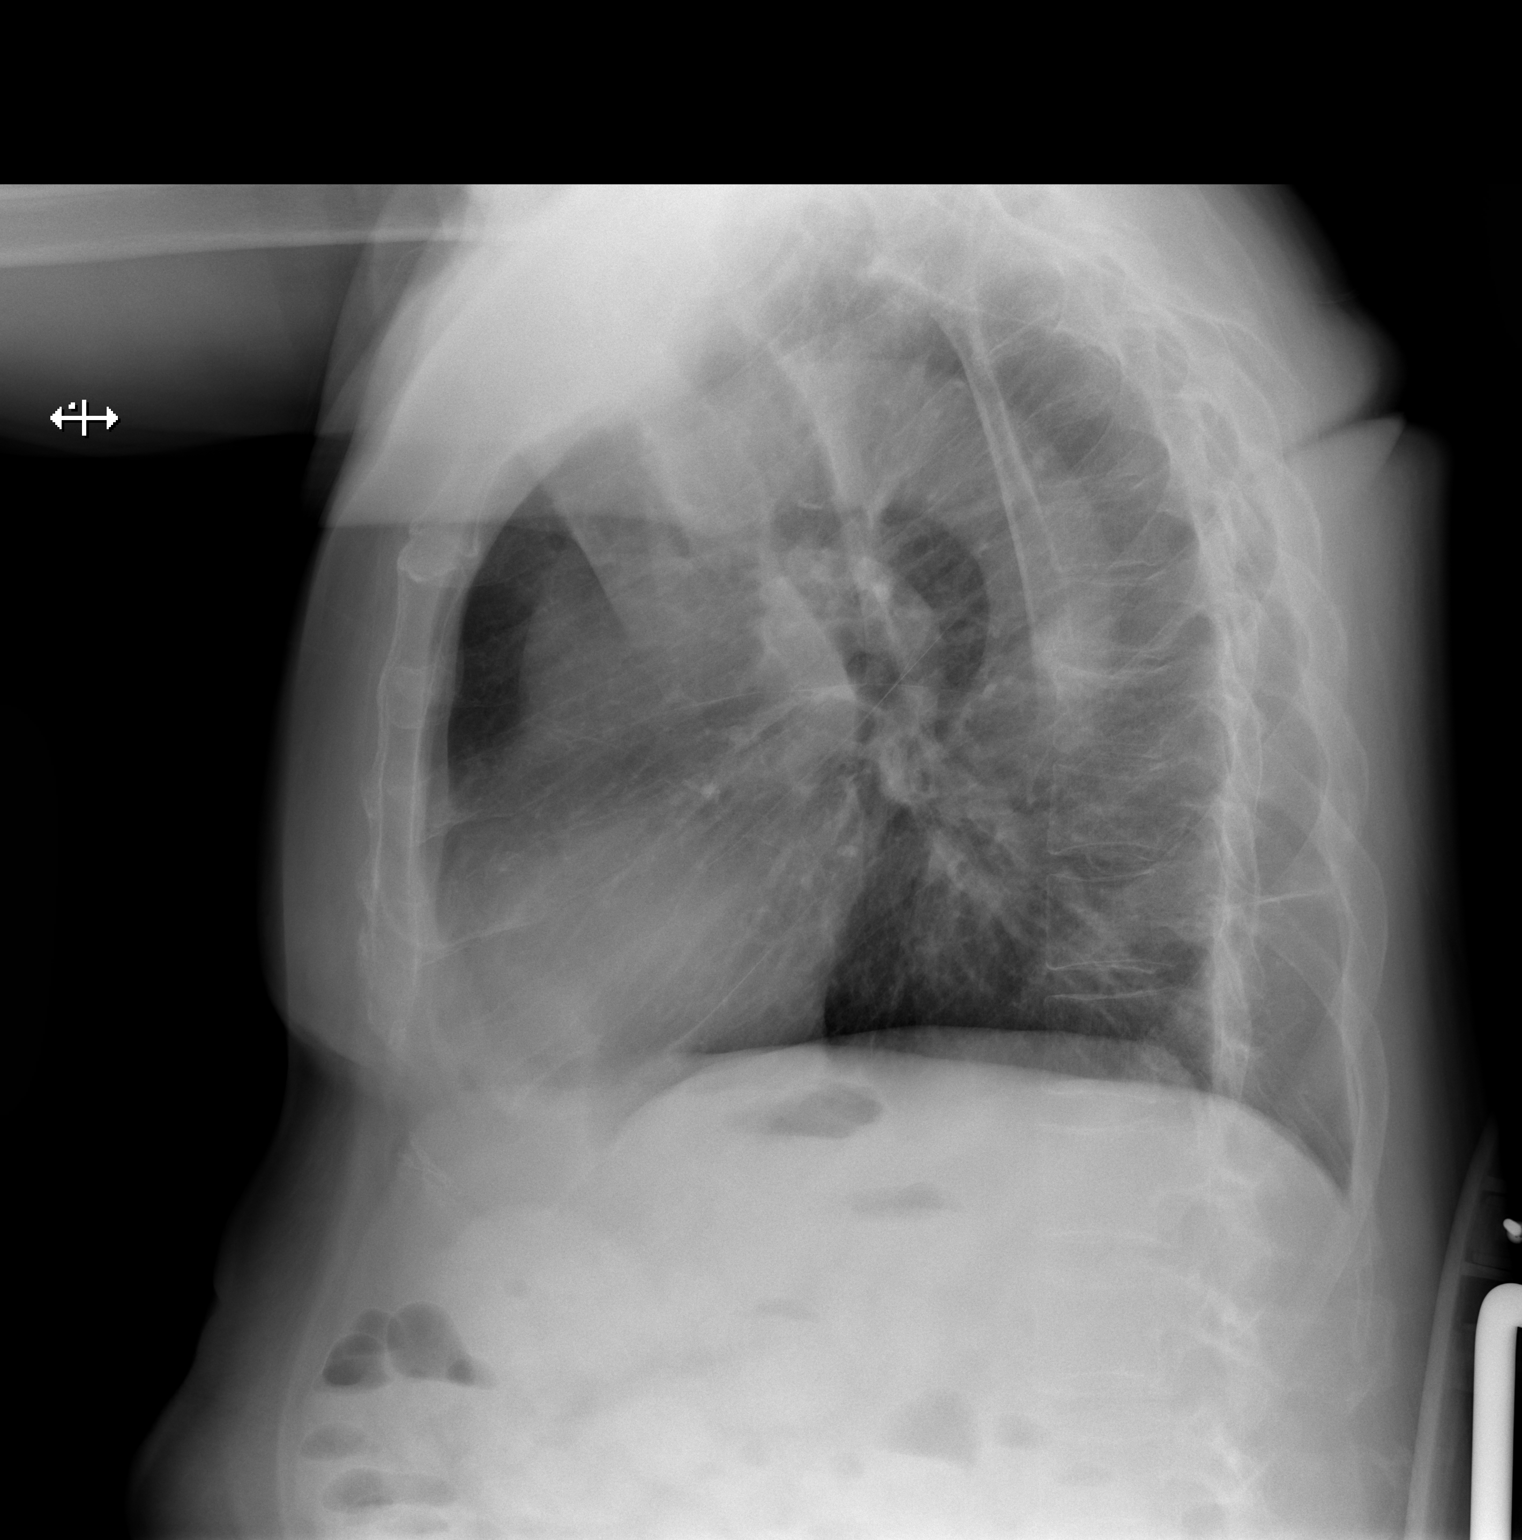

[2 of 2 positions shown; findings below may reference images not displayed]

FINDINGS: AP and lateral views obtained sitting. Heart is normal in size.
Stable mediastinal contours with aortic atherosclerosis and
tortuosity. There is no focal airspace disease. No pleural fluid. No
pneumothorax. No evidence of pulmonary nodule or mass. The bones are
diffusely under mineralized. No acute osseous abnormalities are
seen.
IMPRESSION: No acute chest findings, explanation for weight loss or shortness of
breath.

Aortic Atherosclerosis ([id number].F).

## 2022-01-02 DIAGNOSIS — E1169 Type 2 diabetes mellitus with other specified complication: Secondary | ICD-10-CM | POA: Diagnosis not present

## 2022-01-13 DIAGNOSIS — R531 Weakness: Secondary | ICD-10-CM | POA: Diagnosis not present

## 2022-01-13 DIAGNOSIS — I69354 Hemiplegia and hemiparesis following cerebral infarction affecting left non-dominant side: Secondary | ICD-10-CM | POA: Diagnosis not present

## 2022-01-13 DIAGNOSIS — N183 Chronic kidney disease, stage 3 unspecified: Secondary | ICD-10-CM | POA: Diagnosis not present

## 2022-01-13 DIAGNOSIS — E1169 Type 2 diabetes mellitus with other specified complication: Secondary | ICD-10-CM | POA: Diagnosis not present

## 2022-01-13 DIAGNOSIS — M7989 Other specified soft tissue disorders: Secondary | ICD-10-CM | POA: Diagnosis not present

## 2022-01-13 DIAGNOSIS — E11621 Type 2 diabetes mellitus with foot ulcer: Secondary | ICD-10-CM | POA: Diagnosis not present

## 2022-01-13 DIAGNOSIS — Z6830 Body mass index (BMI) 30.0-30.9, adult: Secondary | ICD-10-CM | POA: Diagnosis not present

## 2022-01-19 ENCOUNTER — Ambulatory Visit (INDEPENDENT_AMBULATORY_CARE_PROVIDER_SITE_OTHER): Payer: HMO | Admitting: Podiatry

## 2022-01-19 ENCOUNTER — Encounter: Payer: Self-pay | Admitting: Podiatry

## 2022-01-19 DIAGNOSIS — M79675 Pain in left toe(s): Secondary | ICD-10-CM | POA: Diagnosis not present

## 2022-01-19 DIAGNOSIS — E1159 Type 2 diabetes mellitus with other circulatory complications: Secondary | ICD-10-CM

## 2022-01-19 DIAGNOSIS — M79674 Pain in right toe(s): Secondary | ICD-10-CM | POA: Diagnosis not present

## 2022-01-19 DIAGNOSIS — B351 Tinea unguium: Secondary | ICD-10-CM

## 2022-01-19 NOTE — Progress Notes (Signed)
This patient presents to my office for at risk foot care.  This patient requires this care by a professional since this patient will be at risk due to having type 2 diabetes and coagulation defect.  Patient is taking plavix. This patient presents to the office with his wife and in a wheelchair.  This patient is unable to cut nails himself since the patient cannot reach his nails.These nails are painful  wearing shoes.  This patient presents for at risk foot care today. ? ?General Appearance  Alert, conversant and in no acute stress. ? ?Vascular  Dorsalis pedis and posterior tibial  pulses are  not palpable due to swelling  bilaterally.  Capillary return is within normal limits  bilaterally. Cold feet noted.  bilaterally.  Absent digital hair  B/L. ? ?Neurologic  Senn-Weinstein monofilament wire test diminished  bilaterally. Muscle power within normal limits bilaterally. ? ?Nails Thick disfigured discolored nails with subungual debris  hallux toenails  bilaterally. No evidence of bacterial infection or drainage bilaterally. ? ?Orthopedic  No limitations of motion  feet .  No crepitus or effusions noted.  No bony pathology or digital deformities noted. HAV  B/L. ? ?Skin  normotropic skin with no porokeratosis noted bilaterally.  No signs of infections or ulcers noted.    ? ?Onychomycosis  Pain in right toes  Pain in left toes ? ?Consent was obtained for treatment procedures.   Mechanical debridement of nails 1-5  bilaterally performed with a nail nipper.  Filed with dremel without incident.  Diabetic foot exam was performed.  Patient has vascular pathology in that pulses are absent B/L and having cold feet.  Diminished LOPS noted..  Discussed footcare with his wife.  To reschedule in 6  months for both nail care and foot evaluation. ? ? ?Return office visit  3   months                 Told patient to return for periodic foot care and evaluation due to potential at risk complications. ? ? ?Helane Gunther DPM   ?

## 2022-01-21 DIAGNOSIS — R600 Localized edema: Secondary | ICD-10-CM | POA: Diagnosis not present

## 2022-01-29 ENCOUNTER — Telehealth: Payer: Self-pay

## 2022-01-29 NOTE — Telephone Encounter (Signed)
Attempted to contact patient's wife [name] to schedule a Palliative Care consult appointment. No answer left a message to return call.  

## 2022-02-01 DIAGNOSIS — E1169 Type 2 diabetes mellitus with other specified complication: Secondary | ICD-10-CM | POA: Diagnosis not present

## 2022-02-03 ENCOUNTER — Telehealth: Payer: Self-pay

## 2022-02-03 NOTE — Telephone Encounter (Signed)
Attempted to contact patient's spouse [name] to schedule a Palliative Care consult appointment. No answer left a message to return call.  ?

## 2022-02-03 NOTE — Telephone Encounter (Signed)
Spoke with patient's spouse [name] and scheduled a Mychart Palliative Consult for 02/09/22 @ 2 PM.  ? ?Consent obtained; updated Netsmart, Team List and Epic.  ? ?

## 2022-02-09 ENCOUNTER — Telehealth: Payer: Self-pay | Admitting: Hospice

## 2022-02-09 DIAGNOSIS — F03918 Unspecified dementia, unspecified severity, with other behavioral disturbance: Secondary | ICD-10-CM

## 2022-02-09 DIAGNOSIS — G40909 Epilepsy, unspecified, not intractable, without status epilepticus: Secondary | ICD-10-CM

## 2022-02-09 DIAGNOSIS — Z515 Encounter for palliative care: Secondary | ICD-10-CM

## 2022-02-09 NOTE — Progress Notes (Signed)
? ? ?Civil engineer, contracting ?Community Palliative Care Consult Note ?Telephone: [phone]  ?Fax: [phone] ? ?PATIENT NAME: David Irwin ?[address] ?[place] [address] ?[phone] (home)  ?DOB: 1945/10/18 ?MRN: [id number] ? ?PRIMARY CARE PROVIDER:    ?Daisy Floro, MD,  ?[address] ?[place] [address] ?[phone] ? ?REFERRING PROVIDER:   ?Daisy Floro, MD ?[address] ?[place],  [place] [address] ?[phone] ? ?RESPONSIBLE PARTY:   [name] ?Contact Information   ? ? Name Relation Home Work Mobile  ? Jaiven, Graveline [phone]  [phone]  ? ?  ? ? ?TELEHEALTH VISIT STATEMENT ?Due to the COVID-19 crisis, this visit was done via telemedicine from my office and it was initiated and consent by this patient and or family.  ?I connected with patient OR PROXY by a telephone/video  and verified that I am speaking with the correct person. I discussed the limitations of evaluation and management by telemedicine. The patient expressed understanding and agreed to proceed. ?Palliative Care was asked to follow this patient to address advance care planning, complex medical decision making and goals of care clarification. This is the initial visit.  ? ?  ASSESSMENT AND / RECOMMENDATIONS:  ? ?Advance Care Planning: Our advance care planning conversation included a discussion about:    ?The value and importance of advance care planning  ?Difference between Hospice and Palliative care ?Exploration of goals of care in the event of a sudden injury or illness  ?Identification and preparation of a healthcare agent  ?Review and updating or creation of an  advance directive document . ?Decision not to resuscitate or to de-escalate disease focused treatments due to poor prognosis. ? ?CODE STATUS: Discussion on the ramifications and implications of CODE STATUS.  [name] affirmed that patient is a DNR ? ?Goals of Care: Goals include to maximize quality of life and symptom management.  Family is interested in Hospice care when patient qualifies for it.  ? ?I spent 16 minutes providing this initial consultation. More than 50% of the time in this consultation was spent on counseling patient and coordinating communication. ?-------------------------------------------------------------------------------------------------------------------------------------- ? ?Symptom Management/Plan: ?Dementia: Memory loss/confusion. Incontinent of bowel  and bladder, FASTD, ambulatory with rolling walker. Fall and Safety precautions.  Continue Namenda as ordered.  Optimize ongoing supportive care.  Routine CBC CMP ? ?Seizure: Last absence seizure 01/02/22. Continue Keppra as ordered. Seizure precautions discussed. ? ?Follow up: Palliative care will continue to follow for complex medical decision making, advance care planning, and clarification of goals. Return 6 weeks or prn. Encouraged to call provider sooner with any concerns.  ? ?Family /Caregiver/Community Supports: Patient lives at home with spouse, goes for [employer] adult day care 5 days a week.  Patient is followed by [facility]-follow-up 03/04/22 ? ?HOSPICE ELIGIBILITY/DIAGNOSIS: TBD ? ?Chief Complaint: Initial Palliative care visit ? ?HISTORY OF PRESENT ILLNESS:  Starr Engel is a 76 y.o. year old male  with multiple morbidities requiring close monitoring and with high risk of complications and  mortality: Dementia, Seizure disorder, Stroke, Type 2 DM.  ?History obtained from review of EMR, discussion with primary team, caregiver, family and/or Mr. Decesare.  ?Review and summarization of Epic records shows history from other than patient. Rest of 10 point ROS asked and negative.  ?Independent interpretation of tests and reviewed as needed, available labs, patient records, imaging, studies and related documents from the EMR. ? ? ?ROS ?General: NAD ?EYES: denies vision changes ?ENMT: denies dysphagia ?Cardiovascular: denies chest  pain/discomfort ?Pulmonary: denies cough, denies  SOB ?Abdomen: endorses good appetite, denies constipation/diarrhea ?GU: denies dysuria, urinary frequency ?MSK:  endorses weakness,  no falls reported ?Skin: denies rashes or wounds ?Neurological: denies pain, denies insomnia ?Psych: Endorses positive mood ?Heme/lymph/immuno: denies bruises, abnormal bleeding ? ? ?PAST MEDICAL HISTORY:  ?Active Ambulatory Problems  ?  Diagnosis Date Noted  ? Mild cognitive impairment, so stated 05/01/2013  ? Memory loss 05/01/2013  ? Essential hypertension 03/26/2017  ? Hyperlipidemia 03/26/2017  ? Cardiac murmur 03/26/2017  ? Depression 12/16/2017  ? Moderate dementia without behavioral disturbance (HCC) 07/27/2018  ? New onset seizure (HCC) 07/27/2018  ? Acute ischemic right MCA stroke (HCC) 12/07/2018  ? Stroke (HCC) 12/07/2018  ? Pain due to onychomycosis of toenails of both feet 05/12/2021  ? Type 2 diabetes mellitus with vascular disease (HCC) 05/12/2021  ? ?Resolved Ambulatory Problems  ?  Diagnosis Date Noted  ? No Resolved Ambulatory Problems  ? ?Past Medical History:  ?Diagnosis Date  ? CVA (cerebral vascular accident) (HCC) 12/07/2018  ? Diabetes mellitus without complication (HCC)   ? Hypertension   ? Lupus (HCC)   ? Murmur, cardiac   ? Nocturia   ? Seizure (HCC)   ? ? ?SOCIAL HX:  ?Social History  ? ?Tobacco Use  ? Smoking status: Never  ? Smokeless tobacco: Never  ?Substance Use Topics  ? Alcohol use: No  ? ?  ?FAMILY HX:  ?Family History  ?Problem Relation Age of Onset  ? Kidney disease Mother   ? Dementia Father   ?   ? ?ALLERGIES:  ?Allergies  ?Allergen Reactions  ? Liraglutide Other (See Comments)  ?  hallucinations   ? Shellfish Allergy Hives  ?   ? ?PERTINENT MEDICATIONS:  ?Outpatient Encounter Medications as of 02/09/2022  ?Medication Sig  ? acetaminophen (TYLENOL) 500 MG tablet Take 500 mg by mouth every 6 (six) hours as needed for headache (pain).  ? clobetasol cream (TEMOVATE) 0.05 % Apply 1 application topically  as needed (break out lupus reaction on scalp).  ? clopidogrel (PLAVIX) 75 MG tablet Take 1 tablet (75 mg total) by mouth daily.  ? fluticasone (FLONASE) 50 MCG/ACT nasal spray Place 2 sprays into both nostrils at bedtime as needed for allergies or rhinitis.   ? hydroxychloroquine (PLAQUENIL) 200 MG tablet Take 200 mg by mouth 2 (two) times daily.   ? levETIRAcetam (KEPPRA XR) 500 MG 24 hr tablet Take 1 tablet (500 mg total) by mouth in the morning and at bedtime. Take one tablet by mouth twice daily.  ? memantine (NAMENDA) 10 MG tablet Take 1 tablet (10 mg total) by mouth 2 (two) times daily.  ? Multiple Vitamins-Minerals (ALIVE MENS ENERGY PO) Take 1 tablet by mouth daily. Gummie  ? polyvinyl alcohol (LIQUIFILM TEARS) 1.4 % ophthalmic solution Place 1 drop into both eyes daily as needed for dry eyes.  ? potassium chloride SA (K-DUR,KLOR-CON) 20 MEQ tablet Take 20 mEq by mouth daily.   ? PRESCRIPTION MEDICATION Inhale into the lungs at bedtime. CPAP  ? simvastatin (ZOCOR) 20 MG tablet Take 20 mg by mouth at bedtime.   ? tamsulosin (FLOMAX) 0.4 MG CAPS Take 0.4 mg by mouth daily.   ? telmisartan-hydrochlorothiazide (MICARDIS HCT) 40-12.5 MG tablet Take 1 tablet by mouth daily at 12 noon.  ? vitamin B-12 (CYANOCOBALAMIN) 1000 MCG tablet Take 1,000 mcg by mouth daily.  ? ?No facility-administered encounter medications on file as of 02/09/2022.  ? ? ? ?Thank you for the opportunity to participate in the care of  Mr. Wheatley.  The palliative care team will continue to follow. Please call our office at [phone] if we can be of additional assistance.  ? ?Note: Portions of this note were generated with Scientist, clinical (histocompatibility and immunogenetics). Dictation errors may occur despite best attempts at proofreading. ? ?Rosaura Carpenter, NP  ? ?  ?

## 2022-02-17 DIAGNOSIS — R600 Localized edema: Secondary | ICD-10-CM | POA: Diagnosis not present

## 2022-02-17 DIAGNOSIS — I872 Venous insufficiency (chronic) (peripheral): Secondary | ICD-10-CM | POA: Diagnosis not present

## 2022-02-25 ENCOUNTER — Ambulatory Visit: Payer: HMO | Admitting: Family Medicine

## 2022-03-04 DIAGNOSIS — I69354 Hemiplegia and hemiparesis following cerebral infarction affecting left non-dominant side: Secondary | ICD-10-CM | POA: Diagnosis not present

## 2022-03-04 DIAGNOSIS — F0390 Unspecified dementia without behavioral disturbance: Secondary | ICD-10-CM | POA: Diagnosis not present

## 2022-03-04 DIAGNOSIS — Z9989 Dependence on other enabling machines and devices: Secondary | ICD-10-CM | POA: Diagnosis not present

## 2022-03-04 DIAGNOSIS — Z515 Encounter for palliative care: Secondary | ICD-10-CM | POA: Diagnosis not present

## 2022-03-09 ENCOUNTER — Telehealth: Payer: Self-pay | Admitting: Hospice

## 2022-03-09 DIAGNOSIS — F0390 Unspecified dementia without behavioral disturbance: Secondary | ICD-10-CM

## 2022-03-09 DIAGNOSIS — G40909 Epilepsy, unspecified, not intractable, without status epilepticus: Secondary | ICD-10-CM

## 2022-03-09 DIAGNOSIS — Z515 Encounter for palliative care: Secondary | ICD-10-CM

## 2022-03-09 DIAGNOSIS — E1122 Type 2 diabetes mellitus with diabetic chronic kidney disease: Secondary | ICD-10-CM

## 2022-03-09 NOTE — Progress Notes (Signed)
Therapist, nutritional Palliative Care Consult Note Telephone: [phone]  Fax: [phone]  PATIENT NAME: David Irwin [address] [place] [address] [phone] (home)  DOB: 07-22-1946 MRN: [id number]  PRIMARY CARE PROVIDER:    Daisy Floro, MD,  [address] [place] [address] [phone]  REFERRING PROVIDER:   Daisy Floro, MD [address] [place],  [place] [address] [phone]  RESPONSIBLE PARTY:   David Irwin Information     Name Relation Home Work Mobile   David, Irwin [phone]  [phone]       TELEHEALTH VISIT STATEMENT Due to the COVID-19 crisis, this visit was done via telemedicine from my office and it was initiated and consent by this patient and or family.  I connected with patient OR PROXY by a telephone/video  and verified that I am speaking with the correct person. I discussed the limitations of evaluation and management by telemedicine. The patient expressed understanding and agreed to proceed. Palliative Care was asked to follow this patient to address advance care planning, complex medical decision making and goals of care clarification.    ASSESSMENT AND / RECOMMENDATIONS:   CODE STATUS: Discussion on the ramifications and implications of CODE STATUS.  David affirmed that patient is a DNR. Patient has a signed DNR at home.   Goals of Care: Goals include to maximize quality of life and symptom management. Family is interested in Hospice care when patient qualifies for it.   Symptom Management/Plan: Dementia: Ongoing Memory loss/confusion. Incontinent of bowel  and bladder, FAST 6D, ambulatory with rolling walker. Continue Irwin and Safety precautions.  Continue Namenda as ordered.  Optimize ongoing supportive care.  Type 2 DM: Managed with diet. Daily CBG with result transmitted to PCP's office. Last A1c  Feb 2023 7.0,  8.5 12/08/18. Repeat A1c in 3 - 6 months.  Seizure: Last  absence seizure 01/02/22. Continue Keppra as ordered. Seizure precautions discussed.  Routine CBC CMP  Follow up: Palliative care will continue to follow for complex medical decision making, advance care planning, and clarification of goals. Return 6 weeks or prn. Encouraged to call provider sooner with any concerns.   Family /Caregiver/Community Supports: Patient lives at home with spouse, goes for [employer] adult day care 5 days a week.  [facility] follows patient.   HOSPICE ELIGIBILITY/DIAGNOSIS: TBD  Chief Complaint: Follow up visit  HISTORY OF PRESENT ILLNESS:  David Irwin is a 76 y.o. year old male  with multiple morbidities requiring close monitoring and with high risk of complications and  mortality: Dementia, Seizure disorder, Stroke, Type 2 DM.  History obtained from review of EMR, discussion with primary team, caregiver, family and/or David Irwin.  Review and summarization of Epic records shows history from other than patient. Rest of 10 point ROS asked and negative.  Independent interpretation of tests and reviewed as needed, available labs, patient records, imaging, studies and related documents from the EMR.   PAST MEDICAL HISTORY:  Active Ambulatory Problems    Diagnosis Date Noted   Mild cognitive impairment, so stated 05/01/2013   Memory loss 05/01/2013   Essential hypertension 03/26/2017   Hyperlipidemia 03/26/2017   Cardiac murmur 03/26/2017   Depression 12/16/2017   Moderate dementia without behavioral disturbance (HCC) 07/27/2018   New onset seizure (HCC) 07/27/2018   Acute ischemic right MCA stroke (HCC) 12/07/2018   Stroke (HCC) 12/07/2018   Pain due to onychomycosis of toenails of both feet 05/12/2021   Type 2 diabetes mellitus with  vascular disease ([place]) 05/12/2021   Resolved Ambulatory Problems    Diagnosis Date Noted   No Resolved Ambulatory Problems   Past Medical History:  Diagnosis Date   CVA (cerebral vascular accident) ([place]) 12/07/2018    Diabetes mellitus without complication (HCC)    Hypertension    Lupus (HCC)    Murmur, cardiac    Nocturia    Seizure (HCC)     SOCIAL HX:  Social History   Tobacco Use   Smoking status: Never   Smokeless tobacco: Never  Substance Use Topics   Alcohol use: No     FAMILY HX:  Family History  Problem Relation Age of Onset   Kidney disease Mother    Dementia Father       ALLERGIES:  Allergies  Allergen Reactions   Liraglutide Other (See Comments)    hallucinations    Shellfish Allergy Hives      PERTINENT MEDICATIONS:  Outpatient Encounter Medications as of 03/09/2022  Medication Sig   acetaminophen (TYLENOL) 500 MG tablet Take 500 mg by mouth every 6 (six) hours as needed for headache (pain).   clobetasol cream (TEMOVATE) [id number] % Apply 1 application topically as needed (break out lupus reaction on scalp).   clopidogrel (PLAVIX) 75 MG tablet Take 1 tablet (75 mg total) by mouth daily.   fluticasone (FLONASE) 50 MCG/ACT nasal spray Place 2 sprays into both nostrils at bedtime as needed for allergies or rhinitis.    hydroxychloroquine (PLAQUENIL) 200 MG tablet Take 200 mg by mouth 2 (two) times daily.    levETIRAcetam (KEPPRA XR) 500 MG 24 hr tablet Take 1 tablet (500 mg total) by mouth in the morning and at bedtime. Take one tablet by mouth twice daily.   memantine (NAMENDA) 10 MG tablet Take 1 tablet (10 mg total) by mouth 2 (two) times daily.   Multiple Vitamins-Minerals (ALIVE MENS ENERGY PO) Take 1 tablet by mouth daily. Gummie   polyvinyl alcohol (LIQUIFILM TEARS) 1.4 % ophthalmic solution Place 1 drop into both eyes daily as needed for dry eyes.   potassium chloride SA (K-DUR,KLOR-CON) 20 MEQ tablet Take 20 mEq by mouth daily.    PRESCRIPTION MEDICATION Inhale into the lungs at bedtime. CPAP   simvastatin (ZOCOR) 20 MG tablet Take 20 mg by mouth at bedtime.    tamsulosin (FLOMAX) 0.4 MG CAPS Take 0.4 mg by mouth daily.    telmisartan-hydrochlorothiazide (MICARDIS HCT)  40-12.5 MG tablet Take 1 tablet by mouth daily at 12 noon.   vitamin B-12 (CYANOCOBALAMIN) 1000 MCG tablet Take 1,000 mcg by mouth daily.   No facility-administered encounter medications on file as of 03/09/2022.   I spent 40 minutes providing this consultation; this includes time spent with patient/family, chart review and documentation. More than 50% of the time in this consultation was spent on counseling and coordinating communication   Thank you for the opportunity to participate in the care of David Irwin.  The palliative care team will continue to follow. Please call our office at [phone] if we can be of additional assistance.   Note: Portions of this note were generated with Lobbyist. Dictation errors may occur despite best attempts at proofreading.  Teodoro Spray, NP

## 2022-03-17 DIAGNOSIS — R051 Acute cough: Secondary | ICD-10-CM | POA: Diagnosis not present

## 2022-04-29 DIAGNOSIS — M25511 Pain in right shoulder: Secondary | ICD-10-CM | POA: Diagnosis not present

## 2022-04-29 DIAGNOSIS — R413 Other amnesia: Secondary | ICD-10-CM | POA: Diagnosis not present

## 2022-04-29 DIAGNOSIS — M1991 Primary osteoarthritis, unspecified site: Secondary | ICD-10-CM | POA: Diagnosis not present

## 2022-04-29 DIAGNOSIS — R430 Anosmia: Secondary | ICD-10-CM | POA: Diagnosis not present

## 2022-04-29 DIAGNOSIS — M5136 Other intervertebral disc degeneration, lumbar region: Secondary | ICD-10-CM | POA: Diagnosis not present

## 2022-04-29 DIAGNOSIS — M329 Systemic lupus erythematosus, unspecified: Secondary | ICD-10-CM | POA: Diagnosis not present

## 2022-04-29 DIAGNOSIS — N1832 Chronic kidney disease, stage 3b: Secondary | ICD-10-CM | POA: Diagnosis not present

## 2022-06-01 ENCOUNTER — Telehealth: Payer: Self-pay | Admitting: Hospice

## 2022-07-22 ENCOUNTER — Ambulatory Visit: Payer: HMO | Admitting: Podiatry

## 2022-07-29 ENCOUNTER — Telehealth: Payer: Self-pay | Admitting: Diagnostic Neuroimaging

## 2022-07-29 MED ORDER — LEVETIRACETAM 100 MG/ML PO SOLN: 500.0000 mg | Freq: Two times a day (BID) | ORAL | 4 refills | Status: AC

## 2022-07-29 NOTE — Telephone Encounter (Signed)
Called wife who stated he's not able to swallow medication for seizures, gags, sometimes won't open his mouth. This week he stopped eating and drinking. She thnks a seizure occurred yesterday while eating popsicle. He stopped, stared and wouldn't respond to her, lasted 1-2 minutes. Advised her Dr Leta Baptist is out of office, will send to on call MD and call her back. She verbalized understanding, appreciation.

## 2022-07-29 NOTE — Telephone Encounter (Signed)
Pt's wife reports that pt is now with [facility], he is no longer able to take levETIRAcetam (KEPPRA XR) 500 MG 24 hr tablet by mouth.  Wife asking if there is anything else he can take.  Wife states he had a seizure on yesterday, please call.

## 2022-07-29 NOTE — Telephone Encounter (Signed)
Called wife and advised her of new Rx, advised she ask syringe to administer in corner of his mouth. She stated he is now under Hospice care, should she contact hospice MD. I advised she should advise him of new Rx and ongoing med management will be through Hospice MD. She verbalized understanding, appreciation.'

## 2022-07-29 NOTE — Telephone Encounter (Signed)
I sent in an rx for the liquid version of Keppra, we'll see if he's able to take this better than the pill

## 2022-08-05 DEATH — deceased

## 2022-11-17 ENCOUNTER — Telehealth: Payer: HMO | Admitting: Family Medicine
